# Patient Record
Sex: Male | Born: 1935 | Race: White | Hispanic: No | Smoking: Former smoker
Health system: Southern US, Community
[De-identification: ages and names within clinical notes are randomized; demographics above are authoritative.]

## PROBLEM LIST (undated history)

## (undated) DIAGNOSIS — I10 Essential (primary) hypertension: Secondary | ICD-10-CM

## (undated) DIAGNOSIS — E119 Type 2 diabetes mellitus without complications: Secondary | ICD-10-CM

## (undated) DIAGNOSIS — I679 Cerebrovascular disease, unspecified: Secondary | ICD-10-CM

## (undated) DIAGNOSIS — R569 Unspecified convulsions: Secondary | ICD-10-CM

## (undated) DIAGNOSIS — E039 Hypothyroidism, unspecified: Secondary | ICD-10-CM

## (undated) DIAGNOSIS — I472 Ventricular tachycardia, unspecified: Secondary | ICD-10-CM

## (undated) DIAGNOSIS — K2970 Gastritis, unspecified, without bleeding: Secondary | ICD-10-CM

## (undated) DIAGNOSIS — I255 Ischemic cardiomyopathy: Secondary | ICD-10-CM

## (undated) DIAGNOSIS — I2510 Atherosclerotic heart disease of native coronary artery without angina pectoris: Secondary | ICD-10-CM

## (undated) DIAGNOSIS — E785 Hyperlipidemia, unspecified: Secondary | ICD-10-CM

## (undated) HISTORY — PX: CHOLECYSTECTOMY: SHX55

## (undated) HISTORY — PX: CAROTID ENDARTERECTOMY: SUR193

---

## 1998-05-07 DIAGNOSIS — I2510 Atherosclerotic heart disease of native coronary artery without angina pectoris: Secondary | ICD-10-CM

## 1998-05-07 HISTORY — DX: Atherosclerotic heart disease of native coronary artery without angina pectoris: I25.10

## 1999-04-07 HISTORY — PX: CORONARY ARTERY BYPASS GRAFT: SHX141

## 1999-04-19 ENCOUNTER — Inpatient Hospital Stay (HOSPITAL_COMMUNITY): Admission: AD | Admit: 1999-04-19 | Discharge: 1999-05-04 | Payer: Self-pay | Admitting: Cardiology

## 1999-04-21 ENCOUNTER — Encounter: Payer: Self-pay | Admitting: Thoracic Surgery (Cardiothoracic Vascular Surgery)

## 1999-04-25 ENCOUNTER — Encounter: Payer: Self-pay | Admitting: Thoracic Surgery (Cardiothoracic Vascular Surgery)

## 1999-04-26 ENCOUNTER — Encounter: Payer: Self-pay | Admitting: Thoracic Surgery (Cardiothoracic Vascular Surgery)

## 1999-04-27 ENCOUNTER — Encounter: Payer: Self-pay | Admitting: Thoracic Surgery (Cardiothoracic Vascular Surgery)

## 2002-05-07 DIAGNOSIS — K2970 Gastritis, unspecified, without bleeding: Secondary | ICD-10-CM

## 2002-05-07 DIAGNOSIS — I679 Cerebrovascular disease, unspecified: Secondary | ICD-10-CM

## 2002-05-07 HISTORY — DX: Gastritis, unspecified, without bleeding: K29.70

## 2002-05-07 HISTORY — DX: Cerebrovascular disease, unspecified: I67.9

## 2002-06-09 ENCOUNTER — Ambulatory Visit (HOSPITAL_COMMUNITY): Admission: RE | Admit: 2002-06-09 | Discharge: 2002-06-09 | Payer: Self-pay | Admitting: Pulmonary Disease

## 2002-09-04 ENCOUNTER — Encounter: Payer: Self-pay | Admitting: Emergency Medicine

## 2002-09-04 ENCOUNTER — Emergency Department (HOSPITAL_COMMUNITY): Admission: EM | Admit: 2002-09-04 | Discharge: 2002-09-04 | Payer: Self-pay | Admitting: Emergency Medicine

## 2002-09-05 ENCOUNTER — Emergency Department (HOSPITAL_COMMUNITY): Admission: EM | Admit: 2002-09-05 | Discharge: 2002-09-05 | Payer: Self-pay | Admitting: Emergency Medicine

## 2003-01-03 ENCOUNTER — Encounter: Payer: Self-pay | Admitting: Emergency Medicine

## 2003-01-03 ENCOUNTER — Inpatient Hospital Stay (HOSPITAL_COMMUNITY): Admission: EM | Admit: 2003-01-03 | Discharge: 2003-01-07 | Payer: Self-pay | Admitting: Emergency Medicine

## 2003-01-05 ENCOUNTER — Encounter: Payer: Self-pay | Admitting: Internal Medicine

## 2003-01-23 ENCOUNTER — Emergency Department (HOSPITAL_COMMUNITY): Admission: EM | Admit: 2003-01-23 | Discharge: 2003-01-23 | Payer: Self-pay | Admitting: Emergency Medicine

## 2003-01-23 ENCOUNTER — Encounter: Payer: Self-pay | Admitting: Emergency Medicine

## 2003-04-09 ENCOUNTER — Emergency Department (HOSPITAL_COMMUNITY): Admission: EM | Admit: 2003-04-09 | Discharge: 2003-04-09 | Payer: Self-pay | Admitting: Emergency Medicine

## 2003-04-10 ENCOUNTER — Emergency Department (HOSPITAL_COMMUNITY): Admission: EM | Admit: 2003-04-10 | Discharge: 2003-04-10 | Payer: Self-pay | Admitting: Emergency Medicine

## 2003-11-08 ENCOUNTER — Inpatient Hospital Stay (HOSPITAL_COMMUNITY): Admission: EM | Admit: 2003-11-08 | Discharge: 2003-11-10 | Payer: Self-pay | Admitting: Emergency Medicine

## 2005-01-15 ENCOUNTER — Emergency Department (HOSPITAL_COMMUNITY): Admission: EM | Admit: 2005-01-15 | Discharge: 2005-01-15 | Payer: Self-pay | Admitting: Emergency Medicine

## 2006-11-28 ENCOUNTER — Ambulatory Visit (HOSPITAL_COMMUNITY): Admission: RE | Admit: 2006-11-28 | Discharge: 2006-11-28 | Payer: Self-pay | Admitting: Pulmonary Disease

## 2008-10-28 ENCOUNTER — Emergency Department (HOSPITAL_COMMUNITY): Admission: EM | Admit: 2008-10-28 | Discharge: 2008-10-28 | Payer: Self-pay | Admitting: Emergency Medicine

## 2008-11-05 ENCOUNTER — Encounter (INDEPENDENT_AMBULATORY_CARE_PROVIDER_SITE_OTHER): Payer: Self-pay | Admitting: *Deleted

## 2008-12-09 DIAGNOSIS — I10 Essential (primary) hypertension: Secondary | ICD-10-CM | POA: Insufficient documentation

## 2008-12-09 DIAGNOSIS — E039 Hypothyroidism, unspecified: Secondary | ICD-10-CM | POA: Insufficient documentation

## 2008-12-10 ENCOUNTER — Ambulatory Visit: Payer: Self-pay | Admitting: Internal Medicine

## 2008-12-10 DIAGNOSIS — R1319 Other dysphagia: Secondary | ICD-10-CM

## 2008-12-13 ENCOUNTER — Encounter: Payer: Self-pay | Admitting: Internal Medicine

## 2008-12-22 ENCOUNTER — Encounter: Payer: Self-pay | Admitting: Internal Medicine

## 2008-12-22 ENCOUNTER — Ambulatory Visit: Payer: Self-pay | Admitting: Internal Medicine

## 2008-12-22 ENCOUNTER — Ambulatory Visit (HOSPITAL_COMMUNITY): Admission: RE | Admit: 2008-12-22 | Discharge: 2008-12-22 | Payer: Self-pay | Admitting: Internal Medicine

## 2008-12-28 ENCOUNTER — Encounter: Payer: Self-pay | Admitting: Internal Medicine

## 2009-04-26 ENCOUNTER — Emergency Department (HOSPITAL_COMMUNITY): Admission: EM | Admit: 2009-04-26 | Discharge: 2009-04-26 | Payer: Self-pay | Admitting: Emergency Medicine

## 2009-10-21 ENCOUNTER — Ambulatory Visit (HOSPITAL_COMMUNITY)
Admission: RE | Admit: 2009-10-21 | Discharge: 2009-10-21 | Payer: Self-pay | Source: Home / Self Care | Admitting: Pulmonary Disease

## 2010-05-30 ENCOUNTER — Observation Stay (HOSPITAL_COMMUNITY)
Admission: EM | Admit: 2010-05-30 | Discharge: 2010-05-31 | Payer: Self-pay | Source: Home / Self Care | Attending: Pulmonary Disease | Admitting: Pulmonary Disease

## 2010-05-31 LAB — BASIC METABOLIC PANEL
BUN: 33 mg/dL — ABNORMAL HIGH (ref 6–23)
CO2: 30 mEq/L (ref 19–32)
Calcium: 9.5 mg/dL (ref 8.4–10.5)
Chloride: 103 mEq/L (ref 96–112)
Chloride: 105 mEq/L (ref 96–112)
GFR calc Af Amer: 48 mL/min — ABNORMAL LOW (ref >60)
GFR calc non Af Amer: 44 mL/min — ABNORMAL LOW (ref >60)
GFR calc non Af Amer: 40 mL/min — ABNORMAL LOW (ref >60)
Potassium: 4 mEq/L (ref 3.5–5.1)
Potassium: 4.2 mEq/L (ref 3.5–5.1)
Sodium: 139 mEq/L (ref 135–145)
Sodium: 140 mEq/L (ref 135–145)

## 2010-05-31 LAB — CBC
HCT: 37.8 % — ABNORMAL LOW (ref 39.0–52.0)
Platelets: 133 10*3/uL — ABNORMAL LOW (ref 150–400)
RBC: 3.98 MIL/uL — ABNORMAL LOW (ref 4.22–5.81)
WBC: 5.7 10*3/uL (ref 4.0–10.5)

## 2010-05-31 LAB — DIFFERENTIAL
Basophils Absolute: 0 10*3/uL (ref 0.0–0.1)
Basophils Relative: 1 % (ref 0–1)
Eosinophils Absolute: 0.1 10*3/uL (ref 0.0–0.7)
Lymphocytes Relative: 13 % (ref 12–46)
Lymphs Abs: 0.7 10*3/uL (ref 0.7–4.0)
Monocytes Absolute: 0.6 10*3/uL (ref 0.1–1.0)
Monocytes Relative: 10 % (ref 3–12)
Neutro Abs: 4.3 10*3/uL (ref 1.7–7.7)

## 2010-05-31 LAB — GLUCOSE, CAPILLARY
Glucose-Capillary: 185 mg/dL — ABNORMAL HIGH (ref 70–99)
Glucose-Capillary: 223 mg/dL — ABNORMAL HIGH (ref 70–99)
Glucose-Capillary: 257 mg/dL — ABNORMAL HIGH (ref 70–99)

## 2010-05-31 LAB — CARDIAC PANEL(CRET KIN+CKTOT+MB+TROPI)
CK, MB: 1.2 ng/mL (ref 0.3–4.0)
Relative Index: INVALID (ref 0.0–2.5)
Total CK: 52 U/L (ref 7–232)
Total CK: 40 U/L (ref 7–232)
Troponin I: 0.05 ng/mL (ref 0.00–0.06)

## 2010-05-31 LAB — POCT CARDIAC MARKERS
CKMB, poc: 1.5 ng/mL (ref 1.0–8.0)
CKMB, poc: 1.3 ng/mL (ref 1.0–8.0)
Myoglobin, poc: 237 ng/mL (ref 12–200)
Troponin i, poc: <0.05 ng/mL (ref 0.00–0.09)

## 2010-05-31 LAB — HEPATIC FUNCTION PANEL
Bilirubin, Direct: 0.1 mg/dL (ref 0.0–0.3)
Indirect Bilirubin: 0.4 mg/dL (ref 0.3–0.9)
Total Protein: 6.3 g/dL (ref 6.0–8.3)

## 2010-05-31 LAB — PHENYTOIN LEVEL, TOTAL: Phenytoin Lvl: 8.1 ug/mL — ABNORMAL LOW (ref 10.0–20.0)

## 2010-06-03 NOTE — H&P (Signed)
NAME:  Donald Owen, Donald Owen                 ACCOUNT NO.:  [id number]  MEDICAL RECORD NO.:  [id number]          PATIENT TYPE:  INP  LOCATION:  A209                          FACILITY:  [facility]  PHYSICIAN:  Donald Owen, M.D.DATE OF BIRTH:  01-22-1934  DATE OF ADMISSION:  05/30/2010 DATE OF DISCHARGE:  LH                             HISTORY & PHYSICAL   [name] is admitted with chest discomfort.  He is a 77 year old who has a known history of coronary artery occlusive disease and who came to the emergency room with chest discomfort.  It is in the middle of his chest.  It is described as an ache but he did not have shortness of breath, diaphoresis, or nausea associated with it.  His pain is currently gone.  It had been as high as a 7/10.  It had a somewhat stuttering course.  PAST MEDICAL HISTORY:  Positive for coronary artery occlusive disease status post bypass grafting, hypertension, diabetes, gout.  He has had a previous MI.  He has a seizure disorder.  He has had a stroke.  He has peripheral vascular disease.  He has had a previous carotid endarterectomy and surgically otherwise he has had a bypass grafting and cholecystectomy.  He has history of hypothyroidism and hyperlipidemia.  SOCIAL HISTORY:  He has about a 40 pack-year smoking history but stopped some years ago.  He does not use any illicit drugs.  He does not use any alcohol.  He lives at home with his daughter.  FAMILY HISTORY:  His mother died in her 80s of unknown cause.  Father had heart disease and died in his 24s.  PHYSICAL EXAMINATION:  GENERAL:  A well-developed, well-nourished mildly obese male who is in no acute distress now.  He is alert and oriented. He has some mild difficulty with speech which is chronic since his stroke some years ago. VITAL SIGNS:  His temperature is 97.5, pulse 77, respirations 20, blood pressure 174/83, O2 sats 96% on 2 liters, height 66 inches, weight 112.9 kg. HEENT:  His pupils are  reactive.  Nose and throat are clear. NECK:  Supple.  He has a carotid endarterectomy scar.  No bruits. CHEST:  No wheezes, rales, or rhonchi. HEART:  Regular without murmur, gallop, or rub. ABDOMEN:  Soft, obese without masses. EXTREMITIES:  1+ edema. CENTRAL NERVOUS SYSTEM:  Grossly intact.  LABORATORY WORK:  White count is 5700, hemoglobin is 12.8, platelets 133,000.  BMET shows his BUN is 34, creatinine 1.56.  Dilantin level 8.1.  Hepatic function normal.  Cardiac markers though far, no evidence of infarction.  His BNP is 256.  EKG shows sinus rhythm what appears to be a right bundle-branch block.  His BNP was mildly elevated at 256.  ASSESSMENT:  He has chest discomfort which has resolved.  He has known coronary artery occlusive disease.  He has elevated BNP.  Chest x-ray is suggestive at least that he has some volume overload, so we are going to plan to give him Lasix, cycle his cardiac enzymes, cycle his EKGs, and I will have him get a [facility] cardiology consultation.  He has not seen his cardiologist in some years.     Donald Owen, M.D.     ELH/MEDQ  D:  05/30/2010  T:  05/30/2010  Job:  [id number]  Electronically Signed by Kari Baars M.D. on 06/03/2010 10:43:46 AM

## 2010-06-03 NOTE — Progress Notes (Signed)
  NAME:  Donald Owen, Donald Owen                 ACCOUNT NO.:  [id number]  MEDICAL RECORD NO.:  [id number]          PATIENT TYPE:  OBV  LOCATION:  A209                          FACILITY:  [facility]  PHYSICIAN:  Dhanvi Boesen L. Juanetta Gosling, M.D.DATE OF BIRTH:  08/09/1933  DATE OF PROCEDURE: DATE OF DISCHARGE:  05/31/2010                                PROGRESS NOTE   Donald Owen is overall I think about the same.  He says he is not having any more chest pain and he is no more short of breath than he has been.  PHYSICAL EXAMINATION:  VITAL SIGNS:  Shows that his temperature is 97.9, pulse 71, respirations 20, blood pressure 157/72, O2 sats 92% on room air, weight 111.13 kg. CHEST:  Fairly clear. HEART:  Regular. ABDOMEN:  Soft.  His echocardiogram showed that his ejection fraction about 45% but he has a pretty substantial area of hypokinesis.  Dr. Marvel Plan help was noted and appreciated and recommendation is that he probably needs to have cardiac catheterization.  Donald Owen says he does not want to go through with that.  I discussed it with him at some length and asked him to simply think about it at this point.  ASSESSMENT:  My assessment then is that he has coronary artery occlusive disease.  He was admitted with chest discomfort that is poorly described.  He has some congestive heart failure.  His ejection fraction is 45% but he has hypokinesis to akinesis of the inferior and posterior myocardium.  He has a history of bypass grafting in the past.  He has diabetes, seizure disorder, previous stroke, peripheral arterial disease.  PLAN:  He does not want the procedures done, so I am not sure where we are going to go from here.  I will discuss with Cardiology consultants.     Isahia Hollerbach L. Juanetta Gosling, M.D.     ELH/MEDQ  D:  05/31/2010  T:  06/01/2010  Job:  [id number]  Electronically Signed by Kari Baars M.D. on 06/03/2010 10:43:56 AM

## 2010-06-03 NOTE — Discharge Summary (Signed)
NAME:  Donald Owen, KEELIN                 ACCOUNT NO.:  [id number]  MEDICAL RECORD NO.:  [id number]          PATIENT TYPE:  OBV  LOCATION:  A209                          FACILITY:  [facility]  PHYSICIAN:  Edward L. Juanetta Gosling, M.D.DATE OF BIRTH:  1933-07-01  DATE OF ADMISSION:  05/30/2010 DATE OF DISCHARGE:  01/25/2012LH                              DISCHARGE SUMMARY   FINAL DISCHARGE DIAGNOSES: 1. Congestive heart failure. 2. Chest discomfort. 3. History of coronary artery occlusive disease. 4. Hypertension. 5. Diabetes. 6. Gout. 7. History of myocardial infarction. 8. History of stroke. 9. Seizure disorder. 10.Peripheral vascular disease. 11.Status post carotid endarterectomy. 12.Status post coronary artery bypass grafting. 13.Status post cholecystectomy. 14.Hypothyroidism. 15.Hyperlipidemia. 16.Mild chronic renal failure. 17.Anxiety.  HISTORY:  [name] is a 77 year old who came to the emergency room initially with chest pain, but more with shortness of breath.  His chest pain was in the middle of his chest, it was described as an ache, but this had actually resolved and then when he was seen in the emergency room, he had more shortness of breath and evaluation in the emergency room showed evidence of congestive heart failure.  His past medical history is positive as mentioned for multiple medical problems as listed above.  Physical exam showed a well-developed, well-nourished mildly obese male, in no acute distress.  He has had some difficulty with speech which is chronic some years ago.  His chest showed no wheezes, rales or rhonchi. His heart was regular without murmur, gallop or rub.  His abdomen was soft.  Extremities showed 1+ edema.  Laboratory work showed his hemoglobin is 12.8 platelets 133, BUN was 34, creatinine 1.56.  Hepatic function was normal.  Cardiac markers were without evidence of myocardial infarction.  BNP was 256.  EKG showed sinus rhythm, right  bundle-branch block.  He had echocardiogram that showed an ejection fraction.  It was about the same at 45%, but did show some fairly significant hypokinesis/akinesis in the inferior and lateral portions.  He had Cardiology consultation.  He was started on Lasix, and he overall seemed to have improved fairly rapidly.  I obtained Cardiology consultation.  It was felt that this might be due to ischemia, but that he did not show evidence of myocardial infarction. It was suggested that he needs to have a cardiac catheterization, but he refused this.  He was improved at the time of discharge.  His weight was down.  He felt better and he was discharged home on Lantus 25 units at bedtime, Keppra 500 mg b.i.d., carvedilol 25 mg b.i.d., Flomax 0.4 mg b.i.d., Phenergan 25 mg q.4 h. p.r.n. nausea, hydralazine 50 mg 4 times a day, Aggrenox 1 tablet twice a day, Naprosyn 500 mg b.i.d., torsemide 20 mg daily, Dilantin 100 mg in the morning, 300 mg in the evening, Lipitor 80 mg he says he takes one-half twice a day, I told him that he could take one daily, Vicodin 5/500 q.6 h. p.r.n. pain, Xanax 1 mg t.i.d. p.r.n. anxiety, imipramine 50 mg at bedtime, TriCor 145 mg daily, Amaryl 4 mg b.i.d., allopurinol 300 mg daily, potassium chloride 20 mEq  3 times a day and a new medication which is Norvasc 5 mg daily.  He will have early followup in the Cardiology Clinic and he will have followup in my office.  It is felt that his CHF was probably related more to uncontrolled hypertension then to ischemia and his blood pressure will be monitored closely.     Edward L. Juanetta Gosling, M.D.     ELH/MEDQ  D:  05/31/2010  T:  06/01/2010  Job:  [id number]  Electronically Signed by Kari Baars M.D. on 06/03/2010 10:43:51 AM

## 2010-06-14 ENCOUNTER — Encounter: Payer: Self-pay | Admitting: Adult Health

## 2010-07-19 ENCOUNTER — Inpatient Hospital Stay (HOSPITAL_COMMUNITY)
Admission: EM | Admit: 2010-07-19 | Discharge: 2010-07-22 | DRG: 292 | Disposition: A | Discharge status: Home Health | Payer: Medicare Other | Attending: Pulmonary Disease | Admitting: Pulmonary Disease

## 2010-07-19 ENCOUNTER — Emergency Department (HOSPITAL_COMMUNITY): Payer: Medicare Other

## 2010-07-19 DIAGNOSIS — E039 Hypothyroidism, unspecified: Secondary | ICD-10-CM | POA: Diagnosis present

## 2010-07-19 DIAGNOSIS — I10 Essential (primary) hypertension: Secondary | ICD-10-CM | POA: Diagnosis present

## 2010-07-19 DIAGNOSIS — I739 Peripheral vascular disease, unspecified: Secondary | ICD-10-CM | POA: Diagnosis present

## 2010-07-19 DIAGNOSIS — Z951 Presence of aortocoronary bypass graft: Secondary | ICD-10-CM

## 2010-07-19 DIAGNOSIS — I509 Heart failure, unspecified: Principal | ICD-10-CM | POA: Diagnosis present

## 2010-07-19 DIAGNOSIS — J4489 Other specified chronic obstructive pulmonary disease: Secondary | ICD-10-CM | POA: Diagnosis present

## 2010-07-19 DIAGNOSIS — G40909 Epilepsy, unspecified, not intractable, without status epilepticus: Secondary | ICD-10-CM | POA: Diagnosis present

## 2010-07-19 DIAGNOSIS — I69922 Dysarthria following unspecified cerebrovascular disease: Secondary | ICD-10-CM

## 2010-07-19 DIAGNOSIS — E785 Hyperlipidemia, unspecified: Secondary | ICD-10-CM | POA: Diagnosis present

## 2010-07-19 DIAGNOSIS — I69959 Hemiplegia and hemiparesis following unspecified cerebrovascular disease affecting unspecified side: Secondary | ICD-10-CM

## 2010-07-19 DIAGNOSIS — J449 Chronic obstructive pulmonary disease, unspecified: Secondary | ICD-10-CM | POA: Diagnosis present

## 2010-07-19 DIAGNOSIS — E119 Type 2 diabetes mellitus without complications: Secondary | ICD-10-CM | POA: Diagnosis present

## 2010-07-19 DIAGNOSIS — I2510 Atherosclerotic heart disease of native coronary artery without angina pectoris: Secondary | ICD-10-CM | POA: Diagnosis present

## 2010-07-19 LAB — URINALYSIS, ROUTINE W REFLEX MICROSCOPIC
Ketones, ur: NEGATIVE mg/dL
Nitrite: NEGATIVE
Protein, ur: NEGATIVE mg/dL
pH: 5 (ref 5.0–8.0)

## 2010-07-19 LAB — BASIC METABOLIC PANEL
BUN: 41 mg/dL — ABNORMAL HIGH (ref 6–23)
Creatinine, Ser: 1.96 mg/dL — ABNORMAL HIGH (ref 0.4–1.5)
GFR calc non Af Amer: 34 mL/min — ABNORMAL LOW (ref >60)
Potassium: 4.5 mEq/L (ref 3.5–5.1)

## 2010-07-19 LAB — CBC
MCH: 31.1 pg (ref 26.0–34.0)
MCV: 93.1 fL (ref 78.0–100.0)
Platelets: 169 10*3/uL (ref 150–400)
RDW: 14.1 % (ref 11.5–15.5)

## 2010-07-19 LAB — POCT CARDIAC MARKERS

## 2010-07-19 LAB — DIFFERENTIAL
Eosinophils Absolute: 0.2 10*3/uL (ref 0.0–0.7)
Eosinophils Relative: 3 % (ref 0–5)
Lymphs Abs: 1.4 10*3/uL (ref 0.7–4.0)
Monocytes Absolute: 0.7 10*3/uL (ref 0.1–1.0)
Monocytes Relative: 10 % (ref 3–12)

## 2010-07-20 LAB — URINALYSIS, ROUTINE W REFLEX MICROSCOPIC
Glucose, UA: 250 mg/dL — AB
Nitrite: NEGATIVE
Protein, ur: NEGATIVE mg/dL

## 2010-07-20 LAB — CARDIAC PANEL(CRET KIN+CKTOT+MB+TROPI)
CK, MB: 1.2 ng/mL (ref 0.3–4.0)
Relative Index: INVALID (ref 0.0–2.5)

## 2010-07-20 LAB — BASIC METABOLIC PANEL
CO2: 29 mEq/L (ref 19–32)
Calcium: 9.9 mg/dL (ref 8.4–10.5)
Creatinine, Ser: 1.78 mg/dL — ABNORMAL HIGH (ref 0.4–1.5)
Glucose, Bld: 228 mg/dL — ABNORMAL HIGH (ref 70–99)

## 2010-07-20 LAB — URINE CULTURE: Colony Count: NO GROWTH

## 2010-07-21 LAB — GLUCOSE, CAPILLARY
Glucose-Capillary: 216 mg/dL — ABNORMAL HIGH (ref 70–99)
Glucose-Capillary: 324 mg/dL — ABNORMAL HIGH (ref 70–99)
Glucose-Capillary: 209 mg/dL — ABNORMAL HIGH (ref 70–99)
Glucose-Capillary: 238 mg/dL — ABNORMAL HIGH (ref 70–99)
Glucose-Capillary: 262 mg/dL — ABNORMAL HIGH (ref 70–99)

## 2010-07-21 LAB — BASIC METABOLIC PANEL
BUN: 36 mg/dL — ABNORMAL HIGH (ref 6–23)
Chloride: 101 mEq/L (ref 96–112)
GFR calc Af Amer: 44 mL/min — ABNORMAL LOW (ref >60)
GFR calc non Af Amer: 37 mL/min — ABNORMAL LOW (ref >60)
Potassium: 4.4 mEq/L (ref 3.5–5.1)
Sodium: 139 mEq/L (ref 135–145)

## 2010-07-21 LAB — URINE CULTURE
Colony Count: NO GROWTH
Culture: NO GROWTH

## 2010-07-22 LAB — BASIC METABOLIC PANEL
Calcium: 9.4 mg/dL (ref 8.4–10.5)
GFR calc Af Amer: 49 mL/min — ABNORMAL LOW (ref >60)
GFR calc non Af Amer: 40 mL/min — ABNORMAL LOW (ref >60)
Potassium: 4.3 mEq/L (ref 3.5–5.1)
Sodium: 140 mEq/L (ref 135–145)

## 2010-07-22 LAB — GLUCOSE, CAPILLARY
Glucose-Capillary: 226 mg/dL — ABNORMAL HIGH (ref 70–99)
Glucose-Capillary: 183 mg/dL — ABNORMAL HIGH (ref 70–99)

## 2010-07-24 NOTE — H&P (Signed)
NAME:  Donald Owen, Donald Owen                 ACCOUNT NO.:  [id number]  MEDICAL RECORD NO.:  [id number]           PATIENT TYPE:  I  LOCATION:  A306                          FACILITY:  [facility]  PHYSICIAN:  Melvyn Novas, MDDATE OF BIRTH:  1933-09-10  DATE OF ADMISSION:  07/19/2010 DATE OF DISCHARGE:  LH                             HISTORY & PHYSICAL   The patient is a 77 year old white male patient of Dr. Juanetta Gosling with diffuse vascular disease, history of CABG, CHF, ejection fraction 45%, and insulin-dependant diabetes.  The patient complained of some localized sharp epigastric pain this evening and for last 3 days.  His wife states she thought it was the fluid building up again.  The patient relates that he has had some increasing dyspnea as of late and has COPD, quit smoking 15 years ago.  He denies anginal chest pain.  No orthopnea or PND.  He was seen in the ER.  Initial cardiac enzymes were negative; however, his BNP was elevated at 428.  Chest x-ray reveals no evidence of frank pulmonary edema.  He also has uncontrolled hypertension with blood pressure of 167 systolic over 92.  The patient was admitted to control the above issues and to move any ischemic component through lowering of his systolic pressures and monitor his glucoses.  Past medical history is significant for hypothyroidism, coronary artery disease status post CABG x5 thirteen years ago, hypertension, hyperlipidemia, BPH, seizure disorder, gout, insulin-dependant diabetes, CHF, ejection fraction 45%, chronic renal failure, mild general anxiety disorder status post CVA with a right hemiparesis.  Past surgical history is significant for CABG, cholecystectomy, and left carotid endarterectomy.  SOCIAL HISTORY:  He is married.  He has a 40 pack-year history of smoking, quit 15 years ago.  CURRENT MEDICINES: 1. Keppra 500 p.o. b.i.d. 2. Imipramine 50 mg p.o. h.s. 3. Norvasc 5 mg p.o. daily. 4. Flomax 0.4 mg p.o.  daily. 5. Dilantin 100 mg p.o. in a.m. and 3 tablets of 300 mg p.o. h.s. 6. Aspirin 81 mg per day. 7. TriCor 145 p.o. daily. 8. KCl 20 mEq p.o. t.i.d. 9. Allopurinol 300 mg p.o. daily. 10.Lipitor 40 mg p.o. daily. 11.Aggrenox 25/200 p.o. b.i.d. 12.Carvedilol 25 mg p.o. b.i.d. 13.Lantus insulin 25 units h.s. 14.Xanax 1 mg p.o. t.i.d.  PHYSICAL EXAMINATION:  VITAL SIGNS:  Blood pressure at present 144/64, respiratory rate is 22, pulse 65 and regular.  He is afebrile. EYES:  PERRLA intact.  Sclerae clear.  Conjunctivae pink. NECK:  No JVD.  No carotid bruits.  No thyromegaly.  No thyroid bruits. There is a left carotid endarterectomy scar noted. LUNGS:  Prolonged expiratory phase.  Diminished breath sounds at bases. No rales, wheeze, or rhonchi appreciable. HEART:  Regular rhythm.  No murmurs, gallops, heaves, lifts, or rubs. No S3, S4, gallop auscultated. ABDOMEN:  Obese, soft, nontender.  Bowel sounds normoactive.  No guarding, rebound, or hepatosplenomegaly. EXTREMITIES:  No clubbing, cyanosis, or edema. NEUROLOGIC:  Cranial nerves II through XII are grossly intact.  The patient has a right-sided hemiparesis.  IMPRESSION: 1. Atypical left gastric discomfort. 2. Elevated BNP with an ejection fraction reported  45% by     echocardiogram. 3. Uncontrolled hypertension. 4. Insulin-dependant diabetes. 5. Hyperlipidemia. 6. Status post cerebrovascular accident, right hemiparesis. 7. Benign prostatic hypertrophy. 8. Gout. 9. Anxiety disorder. 10.Seizure disorder.  The plan at present is to give insulin a.c. and h.s. glucoses, sensitive Humalog sliding scale to include h.s. coverage.  Continue to resume all current medicines.  Monitor BP.  An 1800-calorie ADA diet, and I will make further recommendations as the database expands.     Melvyn Novas, MD     RMD/MEDQ  D:  07/19/2010  T:  07/20/2010  Job:  [id number]  Electronically Signed by Oval Linsey MD on  07/24/2010 03:49:32 PM

## 2010-07-27 NOTE — Progress Notes (Signed)
  NAME:  Donald Owen, Donald Owen                 ACCOUNT NO.:  [id number]  MEDICAL RECORD NO.:  [id number]           PATIENT TYPE:  I  LOCATION:  A306                          FACILITY:  [facility]  PHYSICIAN:  Kyerra Vargo L. Juanetta Gosling, M.D.DATE OF BIRTH:  1933-05-25  DATE OF PROCEDURE:  07/21/2010 DATE OF DISCHARGE:                                PROGRESS NOTE   Donald Owen is still somewhat short of breath.  He denies any chest discomfort.  He has no other new complaints.  He has not been coughing. His exam today shows temperature of 97.5, pulse 64, respirations 20, blood pressure 160/85, O2 sats 94% on room air.  He has less edema of his legs.  He still has some edema in the soft tissues of his buttocks area.  His chest is clearer.  He still has some rhonchi.  Heart is regular without gallop.  Abdomen shows is soft, protuberant without masses.  Assessment then is he has been admitted with congestive heart failure and he is better but not quite ready for discharge.  He has multiple other medical problems including coronary artery occlusive disease and it was suggested that he have a cardiac catheterization at his last admission which he adamantly refuses.  He has COPD.  He has had a stroke.  He has seizure disorder.  He has gout and he has diabetes. All of his other problems seem to be fairly well-controlled.  I am going to plan to continue his IV diuresis, probably discharge him tomorrow.     Bathsheba Durrett L. Juanetta Gosling, M.D.     ELH/MEDQ  D:  07/22/2010  T:  07/22/2010  Job:  [id number]  Electronically Signed by Kari Baars M.D. on 07/27/2010 09:02:56 AM

## 2010-07-27 NOTE — Progress Notes (Signed)
  NAME:  Donald Owen, Donald Owen                 ACCOUNT NO.:  [id number]  MEDICAL RECORD NO.:  [id number]           PATIENT TYPE:  I  LOCATION:  A306                          FACILITY:  [facility]  PHYSICIAN:  Jaylena Holloway L. Juanetta Gosling, M.D.DATE OF BIRTH:  1933-10-30  DATE OF PROCEDURE: DATE OF DISCHARGE:                                PROGRESS NOTE   [name] is admitted with what appears to be congestive heart failure. He says he is doing better than he was last night when he was admitted, but he is still short of breath.  I do not have a weight to compare yet. He has no other new complaints.  He did not really have much of chest discomfort with this.  I discussed his situation with him again today at his last hospitalization, he was evaluated by the cardiologist and he did not want to have cardiac catheterization done and said his mom has agreed to do that.  So I do not see any reason to ask for Cardiology consultation on this admission.  I am going to plan to continue with his IV Lasix, daily weight, oxygen, and follow from there.  He also has some foul-smelling urine.  I am going to get a urine and C and S and I did not start him on antibiotics yet.  I will see how he does from here.  He has multiple other medical problems including coronary artery disease status post bypass grafting, seizure disorder, he has had previous stroke, but his biggest problem right now is his CHF.     Sadiya Durand L. Juanetta Gosling, M.D.     ELH/MEDQ  D:  07/20/2010  T:  07/21/2010  Job:  [id number]  Electronically Signed by Kari Baars M.D. on 07/27/2010 09:02:27 AM

## 2010-07-27 NOTE — Discharge Summary (Signed)
NAME:  Donald Owen, Donald Owen                 ACCOUNT NO.:  [id number]  MEDICAL RECORD NO.:  [id number]           PATIENT TYPE:  I  LOCATION:  A306                          FACILITY:  [facility]  PHYSICIAN:  Patrycja Mumpower L. Juanetta Gosling, M.D.DATE OF BIRTH:  1933-10-27  DATE OF ADMISSION:  07/19/2010 DATE OF DISCHARGE:  03/17/2012LH                              DISCHARGE SUMMARY   FINAL DISCHARGE DIAGNOSES: 1. Congestive heart failure. 2. Coronary artery occlusive disease, status post bypass grafting. 3. Peripheral vascular disease. 4. History of stroke. 5. Mild right hemiparesis. 6. Mild dysarthria related to his previous stroke. 7. Seizure disorder. 8. Hypertension. 9. Hyperlipidemia. 10.Benign prostatic hypertrophy. 11.Gout. 12.Diabetes, which is insulin dependent. 13.Status post coronary artery bypass grafting. 14.Cholecystectomy. 15.Status post cholecystectomy. 16.Status post left carotid endarterectomy. 17.Hypothyroidism. 18.Chronic obstructive pulmonary disease.  HISTORY:  [name] is a 77 year old who has a long known history of multiple medical problems.  He has had increasing shortness of breath for the last several days.  He was hospitalized here in January of this year with CHF.  At that time, it was recommended that he have cardiac catheterization, but he did not want to do that and he again says that he will not have a cardiac catheterization.  When he came to the emergency room, his blood pressure was 144/64, respirations 22.  He had a left carotid endarterectomy scar.  He had no JVD.  His chest showed some decreased breath sounds.  Heart was regular without gallop.  He had an elevated BNP and felt to have CHF.  HOSPITAL COURSE:  He was treated with intravenous diuretics.  He was ruled out for myocardial infarction and improved.  He was placed on oxygen, continued on his other medications, and by the time of discharge he was much improved.  He admitted that he had not been eating  properly and had been eating a lot of salt, so I have not changed his home medications, but we will have him try to reduce his salt intake and he will have Home Health Services with a CHF protocol.  He is discharged home on Keppra 500 mg b.i.d., imipramine 50 mg at bedtime, allopurinol 300 mg daily, Xanax 0.5 mg t.i.d. p.r.n. anxiety, carvedilol 25 mg b.i.d., amlodipine 5 mg daily, hydralazine 50 mg q.i.d., TriCor 145 mg daily, Lipitor 40 mg daily, he is taking half of an 80 mg tablet b.i.d., Dilantin 100 mg in the morning and 300 mg in the evening, Lantus 25 units at bedtime, torsemide 100 mg one-half tablet daily, Vicodin 5/500 q.6 h p.r.n. pain, aspirin 325 mg daily, naproxen 500 mg b.i.d., Phenergan 25 mg q.4 h p.r.n. nausea, potassium chloride 20 mEq t.i.d., Flomax 0.4 mg b.i.d., Amaryl 4 mg b.i.d., Aggrenox one b.i.d.  As mentioned, he will have Home Health Services with a CHF protocol, more diet instruction and I will try to keep him from ending up back in the hospital.  He will follow up in the office in about 2 weeks.  Saheed Carrington L. Juanetta Gosling, M.D.     ELH/MEDQ  D:  07/22/2010  T:  07/22/2010  Job:  [id number]  Electronically Signed by Kari Baars M.D. on 07/27/2010 09:02:25 AM

## 2010-07-27 NOTE — Progress Notes (Signed)
  NAME:  Donald Owen, Donald Owen                 ACCOUNT NO.:  [id number]  MEDICAL RECORD NO.:  [id number]           PATIENT TYPE:  I  LOCATION:  A306                          FACILITY:  [facility]  PHYSICIAN:  Kayshawn Ozburn L. Juanetta Gosling, M.D.DATE OF BIRTH:  06/29/1933  DATE OF PROCEDURE:  07/22/2010 DATE OF DISCHARGE:  07/22/2010                                PROGRESS NOTE   HISTORY OF PRESENT ILLNESS:  Donald Owen seems to be doing better.  He says he is not having any shortness of breath.  He has not had any chest pain and he feels well and wants to go home.  His oxygen saturation is 95% on room air.  PHYSICAL EXAMINATION:  VITAL SIGNS:  His temperature is 97.9, pulse 62, respirations 20, blood pressure 162/78, and O2 sats 95% on room air. CHEST:  Much clearer. HEART:  Regular. ABDOMEN:  Soft.  He looks more comfortable. EXTREMITIES:  He has only minimal if any edema now.  LABORATORY DATA:  BUN is 30, creatinine 1.67, and potassium is 4.3.  ASSESSMENT:  He is better.  PLAN:  To continue with his current treatments and medications.  No changes today.  I am going to discharge him home.  Please see discharge summary for details.     Marilynne Dupuis L. Juanetta Gosling, M.D.     ELH/MEDQ  D:  07/22/2010  T:  07/22/2010  Job:  [id number]  Electronically Signed by Kari Baars M.D. on 07/27/2010 09:02:59 AM

## 2010-08-07 LAB — BASIC METABOLIC PANEL
BUN: 45 mg/dL — ABNORMAL HIGH (ref 6–23)
CO2: 29 mEq/L (ref 19–32)
Calcium: 10 mg/dL (ref 8.4–10.5)
GFR calc non Af Amer: 26 mL/min — ABNORMAL LOW (ref >60)
Glucose, Bld: 194 mg/dL — ABNORMAL HIGH (ref 70–99)

## 2010-08-07 LAB — PHENYTOIN LEVEL, TOTAL: Phenytoin Lvl: 15.4 ug/mL (ref 10.0–20.0)

## 2010-08-07 LAB — DIFFERENTIAL
Basophils Absolute: 0 10*3/uL (ref 0.0–0.1)
Basophils Relative: 0 % (ref 0–1)
Eosinophils Relative: 1 % (ref 0–5)
Lymphocytes Relative: 6 % — ABNORMAL LOW (ref 12–46)
Neutro Abs: 12.3 10*3/uL — ABNORMAL HIGH (ref 1.7–7.7)

## 2010-08-07 LAB — CBC
Platelets: 187 10*3/uL (ref 150–400)
RDW: 15.5 % (ref 11.5–15.5)

## 2010-08-10 ENCOUNTER — Emergency Department (HOSPITAL_COMMUNITY)
Admission: EM | Admit: 2010-08-10 | Discharge: 2010-08-11 | Disposition: A | Discharge status: Home / Self Care | Payer: Medicare Other | Attending: Emergency Medicine | Admitting: Emergency Medicine

## 2010-08-10 DIAGNOSIS — I252 Old myocardial infarction: Secondary | ICD-10-CM | POA: Insufficient documentation

## 2010-08-10 DIAGNOSIS — R0789 Other chest pain: Secondary | ICD-10-CM | POA: Insufficient documentation

## 2010-08-10 DIAGNOSIS — R569 Unspecified convulsions: Secondary | ICD-10-CM | POA: Insufficient documentation

## 2010-08-10 DIAGNOSIS — I509 Heart failure, unspecified: Secondary | ICD-10-CM | POA: Insufficient documentation

## 2010-08-10 DIAGNOSIS — I452 Bifascicular block: Secondary | ICD-10-CM | POA: Insufficient documentation

## 2010-08-10 DIAGNOSIS — I10 Essential (primary) hypertension: Secondary | ICD-10-CM | POA: Insufficient documentation

## 2010-08-10 DIAGNOSIS — Z951 Presence of aortocoronary bypass graft: Secondary | ICD-10-CM | POA: Insufficient documentation

## 2010-08-10 DIAGNOSIS — I2510 Atherosclerotic heart disease of native coronary artery without angina pectoris: Secondary | ICD-10-CM | POA: Insufficient documentation

## 2010-08-10 DIAGNOSIS — R296 Repeated falls: Secondary | ICD-10-CM | POA: Insufficient documentation

## 2010-08-10 DIAGNOSIS — N289 Disorder of kidney and ureter, unspecified: Secondary | ICD-10-CM | POA: Insufficient documentation

## 2010-08-10 DIAGNOSIS — E1169 Type 2 diabetes mellitus with other specified complication: Secondary | ICD-10-CM | POA: Insufficient documentation

## 2010-08-11 ENCOUNTER — Emergency Department (HOSPITAL_COMMUNITY): Payer: Medicare Other

## 2010-08-11 LAB — BASIC METABOLIC PANEL
Calcium: 9.7 mg/dL (ref 8.4–10.5)
GFR calc Af Amer: 36 mL/min — ABNORMAL LOW (ref >60)
GFR calc non Af Amer: 30 mL/min — ABNORMAL LOW (ref >60)
Glucose, Bld: 207 mg/dL — ABNORMAL HIGH (ref 70–99)
Potassium: 3.9 mEq/L (ref 3.5–5.1)
Sodium: 138 mEq/L (ref 135–145)

## 2010-08-11 LAB — DIFFERENTIAL
Basophils Absolute: 0 10*3/uL (ref 0.0–0.1)
Basophils Relative: 0 % (ref 0–1)
Eosinophils Relative: 3 % (ref 0–5)
Lymphocytes Relative: 11 % — ABNORMAL LOW (ref 12–46)
Neutro Abs: 7.4 10*3/uL (ref 1.7–7.7)

## 2010-08-11 LAB — CBC
HCT: 39.2 % (ref 39.0–52.0)
RDW: 14.5 % (ref 11.5–15.5)
WBC: 9.6 10*3/uL (ref 4.0–10.5)

## 2010-08-11 LAB — POCT CARDIAC MARKERS
CKMB, poc: 1.3 ng/mL (ref 1.0–8.0)
Myoglobin, poc: 143 ng/mL (ref 12–200)
Troponin i, poc: <0.05 ng/mL (ref 0.00–0.09)

## 2010-09-09 NOTE — Consult Note (Signed)
NAME:  Donald Owen, Donald Owen                 ACCOUNT NO.:  [id number]  MEDICAL RECORD NO.:  [id number]          PATIENT TYPE:  INP  LOCATION:  A209                          FACILITY:  [facility]  PHYSICIAN:  Gerrit Friends. Dietrich Pates, MD, FACCDATE OF BIRTH:  1933-09-12  DATE OF CONSULTATION:  05/30/2010 DATE OF DISCHARGE:                                CONSULTATION   Cardiologist previously Dr. Daleen Squibb.  The patient has not been seen by Cardiology in many years.  PRIMARY CARE DOCTOR:  Edward L. Juanetta Gosling, MD  CHIEF COMPLAINT:  Chest tightness and shortness of breath.  HISTORY OF PRESENT ILLNESS:  This is a 77 year old married white male patient with history of CABG in December 2000 with a LIMA to the LAD, SVG to the diagonal 1, SVG to the OM, SVG to the PDA, and SVG to the PLA.  He had prior DMI and ejection fraction of 25-30%, CABG.  The ejection fraction improved to 50% with inferior hypokinesis on echo in 2005.  Adenosine Myoview was negative for ischemia at that same time. The patient presents now with 3-day history of recurrent chest pressure that wax and wane for over these days.  He also had associated dyspnea and leg swelling.  He denies any exertional symptoms.  He did not have any nitroglycerin to use.  His pain worsened at 2 a.m. while he was still awake and he came to the emergency room, where his pain eased with the nitroglycerin paste and IV Lasix.  He also says heart has been racing at times, but denies wandering, dizziness or presyncopal signs or symptoms.  ALLERGIES:  No known drug allergies.  CURRENT MEDICATIONS: 1. Lovenox. 2. Lasix 40 mg IV q.12 h. 3. Carvedilol 25 mg b.i.d. 4. Aggrenox 25/200 b.i.d. 5. Hydralazine 25 mg q.i.d. 6. Tamsulosin 0.4 mg b.i.d.  PAST MEDICAL HISTORY:  Significant for CABG x5 in 2000, history of hypertension, diabetes mellitus, history of CVA x3 without residual, history of hypothyroidism, status post left carotid endarterectomy in 2004, history of  mild dysphagia with esophageal dilatation and erosive gastritis in 2004.  SOCIAL HISTORY:  The patient is married and lives with his wife.  He smoked for 55-60 pack years.  He quit in 2000.  He is retired from [employer].  He does not use alcohol or illicit drugs.  FAMILY HISTORY:  Mother died at 75.  Father died in the 37s with heart disease.  He has 2 brothers and one sister.  REVIEW OF SYSTEMS:  Please see HPI.  Otherwise, review of systems is negative.  PHYSICAL EXAMINATION:  GENERAL:  Very pleasant 77 year old white male, in no acute distress. VITAL SIGNS:  Blood pressure 150/70, pulse 77, temperature 97, O2 sats 96% on 2 L. HEENT:  Head is normocephalic without signs of trauma.  Extraocular eye movements are intact.  Pupils are equal and reactive to light and accommodation.  His throat is moist throughout without erythema or exudate.  Neck is without JVD, carotid bruits or thyroid enlargement. LUNGS:  Decreased breath sounds with bibasilar rales. HEART:  Regular rate and rhythm at 70 beats per minute.  Normal S1  and S2.  Positive S4 with 1/6 systolic murmur at the apex.  ABDOMEN:  Soft without organomegaly, masses, lesions or abnormal tenderness.  It is obese. EXTREMITIES:  He is missing his right thumb.  He has 2+ edema in his right lower extremity up to his knee, 1+ in his left lower extremity up to his knee. NEUROLOGICAL:  He is slightly weaker on his right side.  He is alert and oriented x3.  CHEST X-RAY:  Prior CABG, cardiomegaly and mild CHF.  Echo is pending. EKG normal sinus rhythm with first-degree AV block, right bundle- branch block, left posterior fascicular block.  LABORATORY DATA:  Sodium 139, potassium 4.0, chloride 103, CO2 27, BUN 34, creatinine 1.56, glucose 284, BNP was 256.  CKs, MBs and troponins negative x3.  IMPRESSION: 1. Chest pain consistent with ischemia, myocardial infarction ruled     out. 2. Congestive heart failure, question secondary  to left ventricular     dysfunction versus ischemia. 3. Status post coronary artery bypass graft x5 in 2000. 4. History of Doppler myocardial imaging with ejection fraction of 25%     in 2000, improved to 50% in 2005. 5. Hypertension. 6. Diabetes mellitus. 7. History of cerebrovascular accident x3 without residual, currently     on Aggrenox. 8. Status post left carotid endarterectomy in 2004. 9. Status post esophageal dilatation and erosive gastritis. 10.Renal insufficiency, question if this is new.  PLAN:  We will continue to diuresis.  We will watch his renal function closely.  We will schedule adenosine Myoview once he is stable from a heart failure standpoint.  He will need nitroglycerin to go home with.     Jacolyn Reedy, PA-C   ______________________________ Gerrit Friends. Dietrich Pates, MD, [facility]    ML/MEDQ  D:  05/30/2010  T:  05/31/2010  Job:  [id number]  Electronically Signed by Jacolyn Reedy PA-C on 08/02/2010 12:50:02 PM Electronically Signed by Delphos Bing MD [facility] on 09/09/2010 10:12:51 PM

## 2010-09-19 NOTE — Op Note (Signed)
NAME:  Donald Owen, Donald Owen                 ACCOUNT NO.:  [id number]   MEDICAL RECORD NO.:  [id number]          PATIENT TYPE:  AMB   LOCATION:  DAY                           FACILITY:  [facility]   PHYSICIAN:  R. Roetta Sessions, M.D. DATE OF BIRTH:  04-21-1934   DATE OF PROCEDURE:  12/22/2008  DATE OF DISCHARGE:                               OPERATIVE REPORT   PROCEDURE PERFORMED:  EGD with Elease Hashimoto dilation followed by biopsy.   INDICATIONS FOR PROCEDURE:  A 77 year old gentleman with a history of  recurrent esophageal dysphagia to solids.  Back in 2004 he underwent EGD  and empiric passage of a Maloney dilator with significant improvement in  symptoms.  He has recurrent esophageal dysphagia.  He has a history of  H. pylori infection reportedly treated by Dr. Juanetta Gosling.  EGD is now being  done.  Potential for esophageal dilation reviewed with Donald Owen.  Risks, benefits and alternatives discussed.  Please see the  documentation in the medical record.   PROCEDURE NOTE:  O2 saturation, blood pressure, pulse and respirations  monitored throughout the entire procedure.  Conscious sedation:  Versed  2 mg IV, Demerol 50 mg IV in single dose.  Instrument:  Pentax video  chip system.  Cetacaine spray for topical pharyngeal anesthesia.   FINDINGS:  Examination tubular esophagus revealed no mucosal  abnormalities.  The esophagus is patent down through the EG junction.   Stomach:  Gastric cavity was empty and insufflated well with air.  Thorough examination of the gastric mucosa including retroflex of  proximal stomach esophagogastric junction demonstrated only a small  hiatal hernia and multiple antral erosions.  There was no ulcer or  infiltrating process.  Pylorus patent, easily traversed.  Examination of  the bulb and second portion revealed only bulbar erosions.   Therapeutic/diagnostic maneuvers performed:  1. The scope was withdrawn.  A 56-French Maloney dilator was passed      full insertion with  ease.  A look back revealed no apparent      complication related to the passage of the dilator.   Subsequently antral biopsies for histology were taken x2 without  difficulty or apparent complication.  The patient tolerated the  procedure well as reactive endoscopy.   IMPRESSION:  1. Normal-appearing tubular esophagus.  2. Normal esophageal mucosa.  3. Status passage of a 56-French Maloney dilator.  4. Small hiatal hernia.  5. Bulbar and antral erosions of uncertain significance, otherwise      unremarkable stomach, D1 D2 status post antral biopsies.   RECOMMENDATIONS:  Further recommendations to follow.   As a separate issue, there is a question as to whether or not Donald Owen  has ever had a colonoscopy.  Would defer to Dr. Juanetta Gosling whether or not  he feels screening a colonoscopy would be warranted at some point or  not.      Jonathon Bellows, M.D.  Electronically Signed     RMR/MEDQ  D:  12/22/2008  T:  12/22/2008  Job:  [id number]   cc:   [name], M.D.  Fax: [phone]

## 2010-09-22 NOTE — Group Therapy Note (Signed)
   NAME:  Donald Owen, Donald Owen                           ACCOUNT NO.:  [id number]   MEDICAL RECORD NO.:  [id number]                   PATIENT TYPE:  INP   LOCATION:  A201                                 FACILITY:  [facility]   PHYSICIAN:  Edward L. Juanetta Gosling, M.D.             DATE OF BIRTH:  [birth year]   DATE OF PROCEDURE:  01/04/2003  DATE OF DISCHARGE:                                   PROGRESS NOTE   PROBLEMS:  1. Congestive heart failure.  2. Renal failure.  3. Hypokalemia.  4. Chronic obstructive pulmonary disease.  5. Coronary disease.  6. Hypertension.  7. Dysphasia.   SUBJECTIVE:  Donald Owen continues to have problems with swallowing and says  he does not feel very well.  He has had a stroke and has some difficulty  with speech, and he is a bit harder to understand than usual.  He has no  other new complaints.  He did not tolerate runs of potassium yesterday.   OBJECTIVE:  His exam today shows his temperature is 96.9, pulse 65,  respirations are 18, blood sugar 218, blood pressure 101/55.  His I&O is -  1650.  White count 9500, hemoglobin 14.7, sodium 137, potassium 2.9 still.  BUN is 110, creatinine 2.7 which is somewhat better.  TSH 2.027.   ASSESSMENT:  He is still hypokalemic.  He blood sugar is under fairly good  control, his blood pressure is better.  He still has dysphasia, he has had  the stroke which is about the same.  He has multiple medical problems.  He  does seem to be improving slightly.   PLAN:  For GI consultation today.                                               Edward L. Juanetta Gosling, M.D.    Gwenlyn Found  D:  01/04/2003  T:  01/04/2003  Job:  [id number]

## 2010-09-22 NOTE — Group Therapy Note (Signed)
   NAME:  Donald Owen, Donald Owen                           ACCOUNT NO.:  [id number]   MEDICAL RECORD NO.:  [id number]                   PATIENT TYPE:  INP   LOCATION:  A201                                 FACILITY:  [facility]   PHYSICIAN:  Edward L. Juanetta Gosling, M.D.             DATE OF BIRTH:  09-01-1933   DATE OF PROCEDURE:  DATE OF DISCHARGE:                                   PROGRESS NOTE   PROBLEMS:  1. Acute on chronic renal failure, probably from dehydration.  2. Chronic obstructive pulmonary disease.  3. Coronary disease.  4. Diabetes.  5. Difficulty swallowing.  6. Status post stroke.   HISTORY:  [name] says that he is feeling a little bit better today.  He  is still having some trouble swallowing and his speech evaluation yesterday  showed that he has difficulty with large volumes and clearly aspirates those  silently and has no symptoms based on that aspiration.  His pill  esophagogram was cancelled.  At this point, we are trying to work out what  should and could be done for further evaluation with the GI team.  He says  he has no new complaints today.  His daughter says that he has had some back  pain, but he says he is fine.  He is not having any other new problems.  He  is having no change in his swallowing difficulties.   EXAM:  CHEST:  Relatively clear.  HEART:  Regular.  EXTREMITIES:  He did not have any edema of the extremities.  HEENT:  Mucous membranes are slightly dry.  His nose and throat are clear.   ASSESSMENT:  He has multiple medical problems.   PLAN:  Have him further evaluated by the GI team and try to decide what we  can do.  He may require a procedure like an EGD.                                               Edward L. Juanetta Gosling, M.D.    ELH/MEDQ  D:  01/06/2003  T:  01/06/2003  Job:  [id number]

## 2010-09-22 NOTE — Group Therapy Note (Signed)
   NAME:  Donald Owen, Donald Owen                           ACCOUNT NO.:  [id number]   MEDICAL RECORD NO.:  [id number]                   PATIENT TYPE:  INP   LOCATION:  A201                                 FACILITY:  [facility]   PHYSICIAN:  Edward L. Juanetta Gosling, M.D.             DATE OF BIRTH:  June 05, 1933   DATE OF PROCEDURE:  DATE OF DISCHARGE:                                   PROGRESS NOTE   PROBLEMS:  1. Previous cerebrovascular accident.  2. Difficulty with swallowing.  3. Coronary artery occlusive disease.  4. Hypertension.  5. Diabetes.  6. Hypothyroidism.  7. Acute and chronic renal failure.   SUBJECTIVE:  Donald Owen says he is feeling pretty well and actually slept  okay this morning.  His blood sugar is running in the 120s this morning.  His speech is still difficult to understand, and we are still working that  up, hopefully, getting a barium pill esophagogram and a modified barium  swallow done today.   OBJECTIVE:  Temperature 98.1, blood pressure 118/63, pulse 67, respirations  16, blood sugars as mentioned.   ASSESSMENT:  He seems to be improving but slowly.   PLAN:  Continue with treatments, try to get the studies done.  His labs this  morning shows that his BUN is 72 now, creatinine 1.9.  Potassium still has  not come up a great deal, but he did not tolerate runs of potassium  chloride, so I am not going to do that today.  His potassium is up to 3.                                               Edward L. Juanetta Gosling, M.D.    ELH/MEDQ  D:  01/05/2003  T:  01/05/2003  Job:  [id number]

## 2010-09-22 NOTE — Op Note (Signed)
NAME:  Donald Owen, Donald Owen                           ACCOUNT NO.:  [id number]   MEDICAL RECORD NO.:  [id number]                   PATIENT TYPE:  INP   LOCATION:  A201                                 FACILITY:  [facility]   PHYSICIAN:  Lionel December, M.D.                 DATE OF BIRTH:  [birth year]   DATE OF PROCEDURE:  01/07/2003  DATE OF DISCHARGE:                                 OPERATIVE REPORT   PROCEDURE:  Esophagogastroduodenoscopy with esophageal dilatation.   INDICATIONS FOR PROCEDURE:  Mr. Hoh is a 77 year old Caucasian male who  suffered a CVA a few weeks ago who has dysphagia.  Some of his dysphagia is  felt to be oropharyngeal but there is a concern that he also could have  esophageal dysphagia and, therefore, undergoing this study. The procedure is  reviewed with the patient and informed consent is obtained.   PREOPERATIVE MEDICATIONS:  Cetacaine spray for pharyngeal topical  anesthesia.  Demerol 25 mg IV, Versed 3 mg IV in divided dose.   FINDINGS:  Procedure performed in endoscopy suite.  The patient's vital  signs and O2 saturation were monitored during the procedure and remained  stable.   PROCEDURE:  The patient was placed in left lateral position and the Olympus  videoscope was passed through oropharynx without any difficulty into the  esophagus.  The range of the LES was seen well on the __________ .  There  was no pooling of secretions and the left side appeared to move slower than  the right side.   Esophagus:  Mucosa of the esophagus was normal except he had a coating of  what was felt to be food debris in the distal esophagus.  However, there was  no ring of stricture or changes of esophagitis.   Stomach:  It was empty and distended very well with insufflation.  The folds  of the proximal stomach are normal.  Examination of the mucosa revealed a  large scar of antrum and prepyloric area with scarring and a single erosion.  Pyloric channel, however, was patent.   The angularis, fundus, and cardia  were examined by retroflexing the scope over normal.   Duodenum:  Exam of the bulb and second part of the duodenum was also normal.  Endoscope was withdrawn.   Esophageal dilatation was performed by passing 56 French Maloney dilator  through the esophagus.  The esophagus was re-examined post dilatation and  there was no mucosal injury.  Endoscope is straightened and withdrawn.  The  patient tolerated the procedure well.   FINAL DIAGNOSES:  1. Normal examination of the esophagus.  2. Esophagus dilatated empirically by passing 56 French Maloney dilator     given history of dysphagia.  3. Erosive gastritis with a large antral scar.    RECOMMENDATIONS:  1. Will continue PPI __________  p.o. route.  2. H. pylori serology will be checked.  Lionel December, M.D.    NR/MEDQ  D:  01/07/2003  T:  01/07/2003  Job:  [id number]   cc:   [name], M.D.  [address]  [place]  [place] [address]  Fax: [phone]

## 2010-09-22 NOTE — Consult Note (Signed)
NAME:  Donald Owen, Donald Owen                           ACCOUNT NO.:  [id number]   MEDICAL RECORD NO.:  [id number]                   PATIENT TYPE:  INP   LOCATION:  A218                                 FACILITY:  [facility]   PHYSICIAN:  Donald Owen, M.D.                DATE OF BIRTH:  06/12/1933   DATE OF CONSULTATION:  11/09/2003  DATE OF DISCHARGE:                                   CONSULTATION   PRIMARY CARE PHYSICIAN:  Donald Owen, M.D.   CARDIOLOGIST:  Previously with Dr. Juanito Owen.   HISTORY OF PRESENT ILLNESS:  Donald Owen is a 77 year old man who has a  history of coronary disease status post bypass about five years ago who was  lost to followup and has not seen a cardiologist in about three years.  He  has significant history of diabetes, hypertension, and hyperlipidemia.  He  basically came into the emergency department yesterday after an episode of  discomfort in his chest.  It was the first time he has ever had it happen.  It started about a day and a half prior to when he presented.  Described as  central fullness in his chest.  He is a relatively difficult historian  secondary to his previous stroke, and so it is difficult to know exactly  what was the exacerbating set of symptoms.  However, he is currently pain-  free, feeling reasonably well.  He denies any dyspnea on exertion,  orthopnea, or PND.  He has no peripheral edema.   PAST MEDICAL HISTORY:  1. Coronary disease.  He had a bypass surgery in December of 2000, with a     LIMA to his LAD, saphenous vein graft to a first diagonal, saphenous vein     graft to an obtuse marginal, saphenous vein graft to his posterior     descending coronary artery, as well as a saphenous vein graft to his     posterolateral branch.  2. He has had a stroke in the past and appears to have some significant     sequelae as far as his speech impediment is concerned.  3. Diabetes.  4. Hypertension.  5. Hypothyroidism.  6. Status post left  carotid endarterectomy we think about a year ago.  7. He does have mild dysphagia status post esophageal dilatation back in     September of 2004.  He does have a history of erosive gastritis on an     upper endoscopy done at that time.  8. He has no history of congestive heart failure; however, his left     ventricular systolic function was described as depressed prior to his     bypass surgery.  I cannot find any details of an evaluation of his LV     function subsequent to his bypass surgery.   SOCIAL HISTORY:  He lives in [place] by himself but does have family  next door.  He is retired.  He used to work for [employer].  He does  have five children, all of whom are heavily involved in his health care.  He  used to smoke pretty heavily and has about a 55 to 60-pack-year smoking  history but quit about five years ago after his bypass surgery.  He does not  drink, and he does not use any illicit drugs.   FAMILY HISTORY:  His mother died at age 37 of unknown causes.  Father died  in his 2s of heart trouble.  He has two brothers and two sisters who are  still alive and have no significant heart disease.   MEDICATIONS PRIOR TO ADMISSION:  1. Aspirin 325 mg a day.  2. Potassium chloride 20 mEq three times a day.  3. Imipramine 50 mg at night.  4. Synthroid 25 mcg once a day.  5. Allopurinol 300 mg a day.  6. Aggrenox twice a day.  7. Amaryl 4 mg a day.  8. Hydralazine 25 mg twice a day.  9. Flomax 0.4 mg twice a day.  10.      Lasix 80 mg b.i.d.  11.      Coreg 25 mg b.i.d.  12.      Lipitor 20 mg a day.  13.      Xanax 0.5 mg four times a day.  14.      Lantus insulin.  He takes 15 units in the evening.  15.      Dilantin 300 mg at night.   REVIEW OF SYSTEMS:  Generally negative, with the exception of some  moderately severe constipation which has been appropriately treated here in  the hospital, and he has had resolution of those symptoms.  Otherwise,  generally  negative.   PHYSICAL EXAMINATION:  GENERAL:  He is a well-developed, well-nourished  white man in no apparent distress.  He is alert and oriented x4.  He is a  difficult historian in that he has quite a bit of difficulty finding words  to express himself, but he can speak.  VITAL SIGNS:  Blood pressure 127/70, heart rate 64 in sinus, respiratory  rate 20, and he is afebrile.  HEENT:  Unremarkable.  NECK:  Supple.  There is no jugular venous distension.  He does have a soft  left carotid bruit.  CARDIOVASCULAR:  Regular with no murmur.  His point of maximal impulse is  not palpable.  LUNGS:  Clear to auscultation bilaterally.  ABDOMEN:  He has a soft, nontender abdomen.  His GU and rectal exam were  deferred.  EXTREMITIES:  His pulses are all 1+ throughout without any bruits.  He has  no clubbing, cyanosis, or edema of his lower extremities.  NEUROLOGIC:  Grossly intact, with the exception of the speech problems  previously described.   LABORATORY DATA:  Chest x-ray shows mild cardiomegaly but no obvious  evidence of congestive heart failure.  His electrocardiogram is markedly  abnormal, with sinus rhythm at a rate of 69, with first-degree AV block, P-R  intervals 220 milliseconds.  He has a right axis deviation with a right  bundle branch block, with a Q-R restoration of 160 milliseconds.  His Q-T  interval is prolonged at 501 milliseconds.  He has old inferior Q's  consistent with a myocardial infarction.   White blood cell count 6.0, H&H 14 and 41, platelet count 167.  Sodium 136,  potassium 3.9, chloride 103, bicarb 27, BUN 23,  creatinine 1.5, blood sugar  174.  Liver function studies area all within normal limits.  Three sets of  cardiac enzymes are not consistent with acute myocardial infarction.  His D-  dimer is 0.25 which is within normal limits.  His Dilantin level was 15.1. No thyroid function studies have been obtained.   IMPRESSION AND PLAN:  This is a gentleman with  chest discomfort.  It is  difficult to tell whether it is typical or atypical, but my sense of things  is that this is pretty atypical for coronary disease.  He does have a  history of depressed left ventricular function.  He has diabetes,  hypertension, and hypothyroidism.  I think it is probably reasonable at this  point to assess his left ventricular function with an echocardiogram, as  this will help [place] target further workup.  If his echocardiogram is  unrevealing and his left ventricular function appears normal and if there is  no obvious significant and severe wall motion abnormalities other than that  typically found with a right bundle branch block, then I think we can  probably proceed with a perfusion study.  If on the other hand he has  evidence of depressed left ventricular function, then I think it is probably  reasonable to pursue invasive assessment, as he does have significant risk  factors and has been lost to followup.      ___________________________________________                                            Donald Owen, M.D.   JH/MEDQ  D:  11/09/2003  T:  11/09/2003  Job:  [id number]

## 2010-09-22 NOTE — Procedures (Signed)
NAME:  Donald Owen, Donald Owen                           ACCOUNT NO.:  [id number]   MEDICAL RECORD NO.:  [id number]                   PATIENT TYPE:  INP   LOCATION:  A218                                 FACILITY:  [facility]   PHYSICIAN:  Vida Roller, M.D.                DATE OF BIRTH:  12/22/1933   DATE OF PROCEDURE:  11/09/2003  DATE OF DISCHARGE:                                  ECHOCARDIOGRAM   CONSULTING PHYSICIAN:  Vida Roller, M.D.   PRIMARY CARE PHYSICIAN:  Edward L. Juanetta Gosling, M.D.   TAPE NUMBER:  [id number].   TAPE COUNT:  Is 0-491.   A 77 year old man with coronary disease, status post bypass surgery for LV  function.   TECHNICAL QUALITY:  Adequate.   M-MODE TRACING:  1. The aorta is 37-mm.  2. Left atrium is 51-mm.  3. The septum is 16-mm.  4. The posterior wall is 10-mm.  5. Left ventricular diastolic dimension is 59-mm.  6. Left ventricular systolic dimension is 41-mm.   IMAGING 2D AND DOPPLER:  1. The left ventricle is mildly dilated.  There is evidence of mildly     depressed left ventricular systolic function.  Estimated ejection     fraction is 45-50%.  There is inferior inferior posterior severe     hypokinesis.  The remainder of the walls appear to move normally.     Diastolic function was not assessed.  2. The right ventricle is normal size with normal systolic function.  3. Both atria appear to be mildly enlarged, the left greater than right.  4. The aortic valve is sclerotic with no stenosis or regurgitation.  5. The mitral valve has mild __________  calcification with trivial     insufficiency.  No stenosis is seen.  6. The tricuspid valve has mild insufficiency and no stenosis.  7. Pulmonic valve not well seen.  8. Pericardial structures appear normal.  9. Inferior vena cava appears to be normal size.  10.      Ascending aorta not well seen.      ___________________________________________                                            Vida Roller, M.D.   JH/MEDQ  D:  11/09/2003  T:  11/09/2003  Job:  [id number]

## 2010-09-22 NOTE — H&P (Signed)
NAME:  Donald Owen, Donald Owen                           ACCOUNT NO.:  [id number]   MEDICAL RECORD NO.:  [id number]                   PATIENT TYPE:  INP   LOCATION:  A201                                 FACILITY:  [facility]   PHYSICIAN:  Kingsley Callander. Ouida Sills, M.D.                  DATE OF BIRTH:  24-May-1933   DATE OF ADMISSION:  01/03/2003  DATE OF DISCHARGE:                                HISTORY & PHYSICAL   CHIEF COMPLAINT:  Weakness.   HISTORY OF PRESENT ILLNESS:  This patient is a 77 year old white male  patient of Edward L. Juanetta Gosling, M.D. who presented to the emergency room with  weakness.  He has had recent difficulty swallowing.  He has eaten very  little for four days and has consumed minimal fluids.  He has had difficulty  swallowing and has regurgitated liquids.  He was found to be markedly  dehydrated with a BUN and creatinine of 129 and 1.2 and was hypokalemia at  2.4.  His last bowel movement was earlier today.  He denies diarrhea.  He  has had problems with edema and has recently taken Zaroxolyn which was  discontinued three days ago.  He has been on Lasix 80 mg twice a day.  He  has had difficulty voiding and has been on Flomax b.i.d.  However, after a  catheter was placed here, his residual was approximately 250.   PAST MEDICAL HISTORY:  1. A stroke 7 weeks ago followed by left carotid endarterectomy in [place].     He has a residual right hemiparesis.  2. CAD status post bypass surgery three years ago.  3. Cholecystectomy.  4. Hypertension.  5. Diabetes.  6. Hypothyroidism.   MEDICATIONS:  1. Plavix 75 mg daily.  2. Hydrocodone p.r.n.  3. Flomax b.i.d.  4. Lasix 80 mg b.i.d.  5. Amaryl 4 mg daily.  6. Ambien 5 mg h.s.  7. Zantac 150 mg h.s.  8. Synthroid 150 mcg daily.  9. Hydralazine 25 mg b.i.d.  10.      Aspirin 325 mg daily.  11.      Potassium 10 mEq two t.i.d.  12.      Lantus 15 units h.s.   ALLERGIES:  None.   SOCIAL HISTORY:  He does not smoke cigarettes.  He  does not drink alcohol.   FAMILY HISTORY:  His mother had heart failure.  His father had emphysema.  He has a daughter with diabetes.   REVIEW OF SYSTEMS:  He denied fever, cough, chest pain, difficulty  breathing.   PHYSICAL EXAMINATION:  VITAL SIGNS:  Temperature 96.7, blood pressure 94/43,  pulse 62, respirations 22.  GENERAL:  Alert, comfortable white male.  HEENT:  His conjunctivae are mildly injected.  His oropharynx is actually  fairly moist at present.  NECK:  There is no JVD or thyromegaly.  Has a well-healed left carotid  endarterectomy  scar.  LUNGS:  Clear.  HEART:  Regular with no murmurs.  ABDOMEN:  Nontender with no hepatosplenomegaly.  GENITOURINARY:  A Foley catheter is in place.  EXTREMITIES:  No clubbing, cyanosis, or edema.  NEUROLOGICAL:  He has a residual partial right hemiparesis.   LABORATORY DATA:  White count 9.5, hemoglobin 14.7, platelets 239.  Sodium  131, potassium 2.4, bicarb 233, glucose 220, BUN 129, creatinine 1.2,  calcium 10.3.   IMPRESSION:  1. Dehydration.  He has had difficulty swallowing and difficulty voiding.  A     Foley catheter is in place.  He will be treated with IV fluids.  Will     obtain consults with the gastroenterologist and the speech therapist.  2. Recent stroke.  Continue aspirin and Plavix.  3. Hypokalemia.  Will supplement intravenously.  4. Diabetes.  Treat with sliding scale Humalog.  Will hold his Amaryl and     Lantus.                                               Kingsley Callander. Ouida Sills, M.D.    ROF/MEDQ  D:  01/03/2003  T:  01/03/2003  Job:  [id number]   cc:   [name], M.D.  [address]  [place]  [place] [address]  Fax: [phone]

## 2010-09-22 NOTE — Group Therapy Note (Signed)
   NAME:  Donald Owen, Donald Owen                           ACCOUNT NO.:  [id number]   MEDICAL RECORD NO.:  [id number]                   PATIENT TYPE:  INP   LOCATION:  A201                                 FACILITY:  [facility]   PHYSICIAN:  Edward L. Juanetta Gosling, M.D.             DATE OF BIRTH:  1933-07-27   DATE OF PROCEDURE:  01/07/2003  DATE OF DISCHARGE:                                   PROGRESS NOTE   PROBLEMS:  1. Acute renal failure.  2. Chronic obstructive pulmonary disease.  3. Chronic renal failure.  4. Congestive heart failure.  5. Diabetes.  6. Dysphagia.   SUBJECTIVE:  Donald Owen says he is feeing better.  He had EGD by Dr. Karilyn Cota  this morning and he is a little sedated still.  His temperature is 97.5,  pulse 65, respirations 20, blood sugar between 139 and 160, blood pressure  130/72.  His chest is pretty clear, his heart is regular; as mentioned, he  is a little sluggish.  His BUN and creatinine were down to 43 and 2.0  respectively yesterday, which is much improved and getting close to his  baseline.   ASSESSMENT:  He is much improved.   PLAN:  I am hopeful that he will be able to be discharged home  probably  tomorrow, depending on how he does after the EGD.                                               Edward L. Juanetta Gosling, M.D.    ELH/MEDQ  D:  01/07/2003  T:  01/07/2003  Job:  [id number]

## 2010-09-22 NOTE — Discharge Summary (Signed)
NAME:  Donald Owen, Donald Owen                           ACCOUNT NO.:  [id number]   MEDICAL RECORD NO.:  [id number]                   PATIENT TYPE:  INP   LOCATION:  A218                                 FACILITY:  [facility]   PHYSICIAN:  Edward L. Juanetta Gosling, M.D.             DATE OF BIRTH:  [birth year]   DATE OF ADMISSION:  11/08/2003  DATE OF DISCHARGE:                                 DISCHARGE SUMMARY   FINAL DISCHARGE DIAGNOSES:  1. Chest pain, myocardial infarction ruled out.  2. Status post stroke.  3. Coronary artery occlusive disease.  4. Diabetes mellitus.  5. Seizures following the stroke.  6. Chronic back pain.  7. Hypertension.  8. Hyperlipidemia.   This is a 77 year old who came to the emergency room because of chest  discomfort.  He had been in his usual state of health until the chest  discomfort started.  It went around to his sternal area.  It was a pressure  like pain.  It was intermittent.   PHYSICAL EXAMINATION:  GENERAL:  Showed he was awake, alert.  VITAL SIGNS:  Blood pressure 125/56, pulse 58, respirations 20.  CHEST:  Clear.  He had a midline sternotomy scar.  HEART:  Regular.   Lab work showed a BNP was 77.  Dilantin level 15.  Troponin 0.01, CK 42, MB  of 2.9.   HOSPITAL COURSE:  He was admitted, had serial electrocardiograms and cardiac  enzymes.  Cardiology consultation was obtained, and he underwent a  Cardiolite graded exercise test on the 6th which was negative for ischemia.   He was discharged home on his regular medications which include:  1. Allopurinol 300 mg daily.  2. Aspirin 325 mg daily.  3. Coreg 25 mg p.o. b.i.d.  4. Aggrenox one b.i.d.  5. Lipitor 20 mg b.i.d.  6. Flomax 0.4 mg daily.  7. Synthroid 0.125 mg daily.  8. Lasix 80 mg b.i.d.  9. Hydralazine 25 mg b.i.d.  10.      Amaryl 4 mg daily.  11.      Dilantin 100 mg three tablets at bedtime.  12.      Xanax 0.5 mg t.i.d. p.r.n.  13.      Imipramine 50 mg at bedtime.  14.      Lortab one  p.o. q.i.d. p.r.n. pain.  15.      Potassium chloride 10 mEq t.i.d. two tablets.  16.      Lantus insulin 15 units at bedtime.     ___________________________________________                                         Oneal Deputy Juanetta Gosling, M.D.   Gwenlyn Found  D:  11/10/2003  T:  11/10/2003  Job:  [id number]

## 2010-09-22 NOTE — Group Therapy Note (Signed)
NAME:  Donald Owen, FALLIN                           ACCOUNT NO.:  [id number]   MEDICAL RECORD NO.:  [id number]                   PATIENT TYPE:  INP   LOCATION:  A218                                 FACILITY:  [facility]   PHYSICIAN:  Edward L. Juanetta Gosling, M.D.             DATE OF BIRTH:  1933/08/29   DATE OF PROCEDURE:  DATE OF DISCHARGE:                                   PROGRESS NOTE   PROBLEM:  Chest pain.   SUBJECTIVE:  Mr. Anello says that he is doing okay and has had no further  chest pain.  He has no complaints this morning.  He says that he is  breathing okay.  He had an echocardiogram done and he says that he was told  that his ejection fraction is greater than 50% and, if this is indeed the  situation, that is, of course, good.   PHYSICAL EXAMINATION:  GENERAL:  He is alert, oriented and looks  comfortable.  VITAL SIGNS:  His temperature is 96.9; pulse 57; respirations 20; blood  sugar 138 (it has been as high as 167); blood pressure 131/64.  CHEST:  Clear.  HEART:  Regular.  ABDOMEN:  Soft.   ASSESSMENT:  He has known coronary artery occlusive disease.  He is being  worked up for the possibility that he has some sort of an infarction.  He  has peripheral vascular disease with multiple interventions.  He has had a  stroke, which is stable.  He has diabetes, which is under good control,  hypertension, which is under good control, and a history of congestive heart  failure.   PLAN:  Cardiolite stress test today.   ADDENDUM:  The echocardiogram official report shows 45 to 50% ejection  fraction and inferior and posterior hypokinesis.      ___________________________________________                                            Oneal Deputy Juanetta Gosling, M.D.   Gwenlyn Found  D:  11/10/2003  T:  11/10/2003  Job:  [id number]

## 2010-09-22 NOTE — Op Note (Signed)
[facility]  Patient:    Donald Owen                    MRN: [id number] Proc. Date: 04/25/99 Adm. Date:  [id number] Attending:  Rollene Rotunda CC:         Lewayne Bunting, M.D.             Rollene Rotunda, M.D. [facility]             Kari Baars, M.D.             Butch Penny, M.D.             [facility]                           Operative Report  PREOPERATIVE DIAGNOSIS:  Severe three-vessel coronary artery disease, status post acute myocardial infarction with class IV congestive heart failure and depressed left ventricular function.  POSTOPERATIVE DIAGNOSIS:  Severe three-vessel coronary artery disease, status post acute myocardial infarction with class IV congestive heart failure and depressed left ventricular function.  PROCEDURE:  Median sternotomy for a coronary artery bypass grafting x 5 (Left internal mammary artery to distal left anterior descending coronary artery, saphenous vein graft to first diagonal branch, saphenous vein graft to first circumflex marginal branch, sequential saphenous vein graft to posterior descending coronary artery, and sequential saphenous vein graft to right posterolateral branch).  SURGEON: Salvatore Decent. Cornelius Moras, M.D.  ASSISTANT:  Areta Haber, P.A.  ANESTHESIA:  General.  BRIEF CLINICAL NOTE:  The patient is a 77 year old, obese, white male from [place], [place] followed by Dr. Kari Baars and referred by Dr. Rollene Rotunda and Dr. Lewayne Bunting for management of coronary artery disease. The patient has a history of hypertension, severe tobacco abuse, and positive family history of coronary artery disease, but no known previous cardiac history. The patient was admitted April 18, 1999 to [facility] with chest pain and noted to have positive cardiac enzymes consistent with recent acute myocardial infarction.  The patient was transferred to [facility], where he  underwent cardiac catheterization.  This demonstrated severe three-vessel coronary artery disease  with depressed left ventricular function, elevated LV and diastolic pressure, estimated approximately 40 mmHg.  The patient was also noted to have mild renal  insufficiency.  OPERATIVE CONSENT:  The patient and his family were counselled at length regarding the indications and potential benefits of coronary artery bypass grafting. They understands the associated risks of surgery, including but not limited to, risks of death, stroke, myocardial infarction, bleeding requiring blood transfusion, arrhythmia, infection, and recurrent coronary artery disease.  They accepts these risks as well as any unforeseen complications and agreed to proceed with the surgery as described.  OPERATIVE NOTE IN DETAIL:  The patient was brought to the operating room on the  above mentioned date, and invasive hemodynamic monitoring was established by the anesthesia service under the care and direction of Dr. Janetta Hora. [place]. The patient was placed in the supine position on the operating table.  Following induction with general endotracheal anesthesia, the patients chest, abdomen, both groins, and both lower extremities were prepared and draped in a sterile manner.  A preoperative transesophageal echocardiogram was performed by Dr. Gelene Mink. This demonstrated moderate left ventricular dysfunction with severe inferior wall hypokinesis and mild global hypokinesis.  There was severe left ventricular hypertrophy.  There was only trace mitral regurgitation.  No other  significant valvular abnormalities were identified.  A median sternotomy incision was performed, and the left internal mammary artery was dissected from the chest wall and prepared for bypass grafting.  The left internal mammary artery was notably a good quality conduit for bypass grafting.  Simultaneously, saphenous vein was obtained  from the patients right lower extremity through a series of longitudinal incisions.  The saphenous vein was notably a good quality conduit for bypass grafting, although somewhat large caliber in places.  The patient was heparinized systemically.  The pericardium was opened.  The patient had a very large heart, which was covered with a large amount of epicardial fat.  The patient has a foreshortened ascending aorta.  The ascending aorta was palpated and was notably free of palpable plaques or calcifications.  The ascending aorta and the right atrial appendage were cannulated for cardiopulmonary bypass.  Adequate heparinization was verified.  Cardiopulmonary bypass was begun, and the surface of the heart was inspected. Distal sites were selected for coronary bypass grafting.  Of note, all of the diagonal branches and circumflex marginal branches were intramyocardial.  The heart was severely hypertrophied, and there was marbling throughout the wall of the left ventricle consistent with old myocardial infarctions, particularly in the inferior wall.  Portions of saphenous vein and the left internal mammary artery were trimmed to appropriate lengths.  A temperature probe was placed in the left ventricular  septum.  A styrofoam pad was placed to protect the left phrenic nerve from thermal injury.  A cardioplegia catheter was placed in the ascending aorta.  The patient was cooled to 28 degrees systemic temperature.  The aortic Cross clamp was applied, and cardioplegia was delivered in an antegrade fashion through the  aortic root.  Additional doses of cardioplegia were administered both through the aortic root and down subsequently placed vein grafts to maintain septal temperature below 15 degrees Centigrade throughout the Cross clamp portion of the operation. The following distal coronary anastomoses were performed: 1. The posterior descending coronary artery was grafted with a  saphenous vein graft  in a side-to-side fashion using running 7-0 Prolene suture.  This coronary measured 1.3 mm in diameter and was of fair quality. 2. The posterolateral branch off of the distal right coronary artery was grafted with a sequential saphenous vein graft using the vein placed at the posterior descending coronary artery.  This coronary measured 1.4 mm in diameter and was f fair to good quality at the site of the distal bypass. 3. The first circumflex marginal branch was grafted with a saphenous vein graft in an end-to-side fashion using running 7-0 Prolene suture.  This coronary measured 2 mm in diameter and was of good quality, but was intramyocardial at the site of distal bypass. 4. The first diagonal branch off the left anterior descending coronary artery was grafted with a saphenous vein graft in an end-to-side fashion using running 7-0 Prolene suture.  This coronary measured 1 mm in diameter and was of fair to  poor quality at the site of distal bypass. 5. The distal left anterior descending coronary artery was grafted with the left internal mammary artery using running 8-0 Prolene suture.  This coronary measured 2 mm in diameter and was of good quality at the site of distal bypass.  The septal temperature was noted to rise rapidly and dramatically upon reperfusion of the eft internal mammary artery.  The heart began to beat spontaneously.  The aortic Cross clamp was removed after a total Cross clamp time  of 82 minutes.  The patient was rewarmed to greater than 37 degrees Centigrade temperature. All three proximal saphenous vein anastomoses were performed to the ascending aorta  under a separate partial occlusion clamp.  All proximal and distal anastomoses ere inspected for hemostasis and appropriate graft orientation.  Epicardial pacing wires were fixed to the right ventricular outflow tract and to the right atrial  appendage.  The patient was weaned  from cardiopulmonary bypass without difficulty. Total cardiopulmonary bypass time for the operation was 168 minutes.  The patients rhythm at separation from bypass was sinus rhythm with atrial pacing for rate only. The patient was weaned from bypass on low-dose dopamine and milrinone infusions.  The venous and arterial cannula were removed uneventfully.  Protamine was administered to reverse the anticoagulation.  Followup transesophageal echocardiogram performed by Dr. Gelene Mink demonstrated somewhat improved left ventricular function.  There still remained some hypokinesis in the inferior wall, but overall contractility seemed to be improved somewhat.  No other abnormalities were identified.  The mediastinum and the left chest were irrigated with saline solution containing vancomycin antibiotic.  Meticulous surgical hemostasis was ascertained.  The mediastinum and the left chest were drained with three chest tubes placed through separate stab incisions inferiorly.  The median sternotomy was closed in a routine fashion.  The right lower extremity incisions were closed in multiple layers in  routine fashion.  The lower extremity skin incisions were closed with skin staples, where as the chest incision was closed with a subcuticular skin closure.  The patient tolerated the procedure well and was transported to the surgical intensive care unit in stable condition.  There were no intraoperative complications.  All sponge, needle, and instrument counts were verified correct at the completion of the operation.  No autologous blood products were administered. DD:  04/25/99 TD:  04/27/99 Job: [id number]

## 2010-09-22 NOTE — Consult Note (Signed)
NAME:  Donald Owen, Donald Owen                           ACCOUNT NO.:  [id number]   MEDICAL RECORD NO.:  [id number]                   PATIENT TYPE:  INP   LOCATION:  A201                                 FACILITY:  [facility]   PHYSICIAN:  R. Roetta Sessions, M.D.              DATE OF BIRTH:  01-28-1934   DATE OF CONSULTATION:  01/04/2003  DATE OF DISCHARGE:                                   CONSULTATION   REQUESTING PHYSICIAN:  Oneal Deputy. Juanetta Gosling, M.D.   HISTORY OF PRESENT ILLNESS:  The patient is a 77 year old Caucasian  gentleman who was admitted with dehydration and hypokalemia.  He had a CVA  with right-sided hemiparesis seven weeks ago.  Since then he has had a left  carotid endarterectomy in [place], [place].  The patient denies any  dysphagia prior to his stroke; however, the last four to five days has had  difficulty swallowing solid foods.  He states he cannot chew solids foods;  the bolus gets larger preventing him from swallowing this.  He has no  difficulty with liquids or soft foods.  Since his stroke on November 08, 2002 he  has lost about 40 pounds.  He denies any abdominal pain, heartburn symptoms,  regurgitation, constipation, diarrhea, melena, or rectal bleeding.   On admission he was found to have a BUN of 129, creatinine of 1.2, potassium  2.4, sodium 131.  He was also complaining of having voiding difficulties.  Postvoid residual revealed 200 mL of urine.  He has been on Zaroxolyn up  until three days ago but continues to be on Lasix 80 mg daily for fluid  retention.   I spoke with Dr. Juanetta Gosling today who tells me that the patient was complaining  of painful swallowing last week.  He was concerned he may have esophageal  candidiasis, given his multiple comorbidities and recent antibiotic use.   CURRENT MEDICATIONS:  1. Lipitor daily.  2. Plavix 75 mg daily.  3. Hydrocodone p.r.n.  4. Flomax b.i.d.  5. Lasix 80 mg b.i.d.  6. Amaryl 4 mg daily.  7. Ambien 5 mg daily.  8. Zantac  150 mg daily.  9. Synthroid 150 mcg daily.  10.      Hydralazine 25 mg b.i.d.  11.      Aspirin 325 mg daily.  12.      Potassium 10 mEq two t.i.d.  13.      Lantus 15 units q.h.s.   ALLERGIES:  No known drug allergies.   PAST MEDICAL HISTORY:  1. CVA seven weeks ago with right-sided hemiparesis.  Status post left     carotid endarterectomy post stroke.  2. Coronary artery disease status post CABG four years ago.  3. Diabetes mellitus diagnosed four years ago.  4. Hypertension.  5. Hypothyroidism.  6. Hypercholesterolemia.  7. Cholecystectomy.   FAMILY HISTORY:  Mother had CHF.  Father had emphysema.  He has  a daughter  with diabetes mellitus.  No family history of colorectal cancer or chronic  GI illnesses.   SOCIAL HISTORY:  He currently lives with his daughter and son-in-law.  He  quit smoking four years ago.  Denies any alcohol use.   REVIEW OF SYSTEMS:  Please see HPI for GI, for GU.  CARDIOPULMONARY:  Denies  any chest pain or shortness of breath.   PHYSICAL EXAMINATION:  VITAL SIGNS:  Weight 207.7, height 5 feet 8 inches.  Temperature 96.9, pulse 65, respirations 18, blood pressure 101/55.  GENERAL:  Pleasant, mildly obese Caucasian male in no acute distress.  SKIN:  Warm and dry, no jaundice.  HEENT:  Conjunctivae are pink, sclerae nonicteric.  Oropharyngeal mucosa  moist and pink.  No lesions, erythema, or exudate.  No lymphadenopathy,  thyromegaly.  CHEST:  Lungs are clear to auscultation.  CARDIAC:  Reveals regular rate and rhythm, normal S1, S2.  No murmurs, rubs,  or gallops.  ABDOMEN:  Positive bowel sounds, slightly obese but symmetrical, soft.  Nontender.  No organomegaly or masses appreciative.  EXTREMITIES:  No edema.   LABORATORY DATA:  As mentioned in HPI.  In addition, wbc 9.5; hemoglobin  14.7; hematocrit 43.8; platelets 239,000.  Sodium 137, potassium 2.9, BUN  110, creatinine 2.7.  TSH 2.027.  Calcium 10.3.  EKG revealed sinus rhythm  with a rate  of 58, first degree A-V block.   IMPRESSION:  The patient is a pleasant 77 year old Caucasian gentleman who  presents with dysphagia six weeks following a recent CVA.  At this time it  is unclear whether symptoms were secondary to oropharyngeal and/or  esophageal abnormalities.  He apparently swallows liquids and soft foods  well which would be inconsistent with oropharyngeal dysphagia.  After  discussion with Dr. Jena Gauss initially we plan to proceed with a barium pill  esophogram to determine the need for EGD with possible dilatation.  At this  time the patient denies any odynophagia; however, if this seems to be an  issue he may need to have an upper endoscopy at any rate.   SUGGESTIONS:  1. Barium pill esophagram initially.  2.     Continue Protonix for now.  3. Agree with potassium supplementation and recheck BMET in the morning.   I would like to thank Dr. Juanetta Gosling for allowing [place] to take part in the care  of this patient.     Tana Coast, Pricilla Larsson, M.D.    LL/MEDQ  D:  01/04/2003  T:  01/04/2003  Job:  [id number]   cc:   [name], M.D.  [address]  [place]  [place] [address]  Fax: [phone]

## 2010-09-22 NOTE — Discharge Summary (Signed)
NAME:  Donald Owen, Donald Owen                           ACCOUNT NO.:  [id number]   MEDICAL RECORD NO.:  [id number]                   PATIENT TYPE:  INP   LOCATION:  A201                                 FACILITY:  [facility]   PHYSICIAN:  Edward L. Juanetta Gosling, M.D.             DATE OF BIRTH:  Apr 05, 1934   DATE OF ADMISSION:  01/03/2003  DATE OF DISCHARGE:  01/07/2003                                 DISCHARGE SUMMARY   FINAL DISCHARGE DIAGNOSES:  1. Dehydration.  2. Acute renal failure.  3. Chronic renal failure.  4. Congestive heart failure.  5. Coronary artery occlusive disease.  6. Diabetes.  7. Peripheral vascular disease, status post carotid endarterectomy.  8. Stroke.  9. Hypertension.  10.      Dysphagia, multiple causes.  11.      Electrolyte imbalances.   HISTORY:  Donald Owen is a 77 year old who has had a long known history of  multiple medical problems including coronary disease, congestive heart  failure, diabetes, peripheral vascular disease, and has recently had a  carotid endarterectomy.  He has mild chronic renal failure.  Over the last  two weeks he has gotten to the point that he has not been able to eat or  drink.  He complains of some pain with swallowing, but his history is  somewhat variable.  He continued to have difficulties, came to the emergency  room where he was found to be dehydrated, and has eaten very little for  about four days.  Had only minimal fluid.  He has been regurgitating, and he  was shown to have a BUN of 129, creatinine of 1.2.  He was hypokalemic at  2.4.   His exam showed a well-developed, well-nourished male who was confused, and  his speech was somewhat sluggish.  He appeared to be markedly dehydrated by  physical exam.  His blood pressure was around 120/80, his pulse was 100.  His chest was clear.  His speech was somewhat garbled and difficult to  understand.  Assessment at the time of admission was that he had become  dehydrated because of his  multiple medical problems and his difficulty with  swallowing.   He was treated with intravenous fluids and had excellent response getting  his BUN and creatinine back down to baseline.  He had evaluation by the GI  team and by the speech therapy team and was found to have difficulty with  swallowing large boluses of fluid and had what may have been an esophageal  stricture.  He underwent EGD and  dilatation on the 2nd and improved.  He then became very panicky and  demanded discharge, and since he was planned to be discharged the next day  anyway, he was discharged home to resume his previous medications and to  follow up in my office in about a month and follow up with home health  services in the meantime.  Edward L. Juanetta Gosling, M.D.    ELH/MEDQ  D:  01/08/2003  T:  01/08/2003  Job:  [id number]

## 2010-09-22 NOTE — H&P (Signed)
NAME:  Donald Owen, Donald Owen                           ACCOUNT NO.:  [id number]   MEDICAL RECORD NO.:  [id number]                   PATIENT TYPE:  INP   LOCATION:  A218                                 FACILITY:  [facility]   PHYSICIAN:  Tesfaye D. Felecia Owen, M.D.              DATE OF BIRTH:  Jul 19, 1933   DATE OF ADMISSION:  11/08/2003  DATE OF DISCHARGE:                                HISTORY & PHYSICAL   CHIEF COMPLAINT:  Chest pain.   HISTORY OF PRESENT ILLNESS:  This is a 77 year old male patient of Dr.  Ramon Dredge L. Juanetta Owen who came to emergency room with above complaints.  The  patient was in his usual state of health until last night, when he started  having midsternal chest pain which was lasting for 5-10 minutes.  The pain  was spreading around his sternal borders.  The pain was like a pressure and  was intermittent.  There was no radiation to his arm or neck area.  The  patient has not taken the nitroglycerin or any other medication to relieve  the pain.  It has continued to recur until this morning, when he came to the  emergency room.  The patient was evaluated in the emergency room and was  admitted for further treatment.  The patient has a history of coronary  artery disease and is status post coronary artery bypass surgery about 5  years back.  The patient has had no chest pain since his surgery.  His last  followup with the cardiologist was about 3 years back.   REVIEW OF SYSTEMS:  No fever, headache, nausea, vomiting, diaphoresis,  abdominal pain, cough, shortness of breath, dysuria, urgency or frequency of  urination.   PAST MEDICAL HISTORY:  1. Coronary artery disease and coronary artery bypass surgery about 5 years     back.  2. History of CVA.  3. Diabetes mellitus.  4. Hypertension.  5. Hypothyroidism.  6. Carotid artery disease and status post left carotid endarterectomy.  7. History dysphagia.   CURRENT MEDICATIONS:  1. Aspirin 325 mg p.o. daily.  2. Kay Ciel 40 mEq p.o.  b.i.d.  3. Imipramine 50 mg p.o. nightly.  4. Synthroid 125 mcg p.o. daily.  5. __________  7.5/500 mg 1 tablet p.o. 4 times daily p.r.n. for pain.  6. Allopurinol 300 mg p.o. daily.  7. Aggrenox AR 1 tablet p.o. b.i.d.  8. Amaryl 4 mg p.o. daily.  9. Hydralazine 25 mg p.o. b.i.d.  10.      Flomax 0.4 mg p.o. b.i.d.  11.      Lasix 80 mg p.o. b.i.d.  12.      __________  100 mg 3 tablets p.o. nightly.  13.      Coreg 25 mg p.o. b.i.d.  14.      Lipitor 20 mg p.o. daily.  15.      Xanax 0.5 mg p.o. 4  times daily.  16.      Insulin Lantus 50 units p.o. nightly.   SOCIAL HISTORY:  The patient stopped smoking about 5 years back.  No history  of alcohol or substance abuse.   PHYSICAL EXAMINATION:  GENERAL/VITAL SIGNS:  The patient is alert, awake and  pleasant-looking, whose vitals show a blood pressure 125/56, pulse 58,  respiratory rate 20, temperature 97 degrees Fahrenheit.  HEENT:  Pupils are equal and reactive.  NECK:  Neck is supple.  CHEST:  There is a midline healed surgical scar.  Good air entry, clear lung  fields.  CARDIOVASCULAR SYSTEM:  First and second heart sounds heard.  No murmur, no  gallop.  ABDOMEN:  Abdomen is soft and relaxed.  Bowel sounds are positive.  No mass  or organomegaly.  EXTREMITIES:  No leg edema.   LABORATORIES ON ADMISSION:  WBC 6.0, hemoglobin 14.3, hematocrit 41.3,  platelets 167,000.  PT 12.6, INR 0.9, PTT 29.  Sodium 136, potassium 3.9,  chloride 103, carbon dioxide 27, glucose 174, BUN 23, creatinine 1.5,  calcium 9.5.  CPK total 42, CK-MB 29, troponin 0.01.  Dilantin level 15.1.  BNP 77.1.   ASSESSMENT:  This is a 77 year old male patient who has a history of  multiple medical illnesses including coronary artery disease, carotid artery  disease and diabetes mellitus, who had previous coronary artery bypass  surgery and carotid endarterectomy, who came with complaint of intermittent  chest pain.  Currently, the patient is relieved from his  pain and is  comfortable.  The patient's initial cardiac enzymes and EKG are negative for  acute ischemia.  However, the patient has multiple risk factors for coronary  artery disease and he had previous myocardial infarction.  The patient has  had no cardiology followup for the last 3 years.  It will be reasonable to  do serial cardiac enzymes and EKGs to rule out acute coronary syndrome.  The  patient also needs cardiology consult for further workup.   PLAN:  The patient will be admitted under telemetry.  We will do EKG and  cardiac enzymes q.8 h. x3.  We will do cardiology consult.  We will continue  the patient on aspirin and Aggrenox for anticoagulation and we will continue  the patient on his regular medications.     ___________________________________________                                         Donald Owen, M.D.   TDF/MEDQ  D:  11/08/2003  T:  11/08/2003  Job:  [id number]   cc:   Donald Owen, M.D.  [address]  [place]  [place] [address]  Fax: [phone]

## 2010-09-22 NOTE — Procedures (Signed)
NAME:  Donald Owen, Donald Owen                           ACCOUNT NO.:  [id number]   MEDICAL RECORD NO.:  [id number]                   PATIENT TYPE:  INP   LOCATION:  A218                                 FACILITY:  [facility]   PHYSICIAN:  Vida Roller, M.D.                DATE OF BIRTH:  1933-11-30   DATE OF PROCEDURE:  DATE OF DISCHARGE:                                    STRESS TEST   PROCEDURE:  Adenosine Cardiolite.   INDICATIONS:  Mr. Deveney is a 77 year old male with known coronary artery  disease status post coronary artery bypass grafting in December of 2000 with  the following grafts:  LIMA to LAD, SVG to D1, SVG to first marginal, SVG to  PDA, SVG to PLA.  The patient also has history of inferior myocardial  infarction with an EF of 25-30% at the time of his CABG in 2000. He now  presents with atypical chest discomfort.  His cardiac enzymes are negative  x3 for acute myocardial infarction and his echocardiogram revealed an EF of  50% with inferior hypokinesis.   BASELINE DATA:  EKG revealed sinus rhythm at 58 beats/minute with PVC and  right bundle branch block.  Baseline  blood pressure is 130/78.   STRESS DATA:  59 mg of adenosine was infused over a 4 minute protocol with  Cardiolite injected at 3 minutes. The patient reported numbness all over  which resolved in recovery.  EKG revealed a few PACs and PVCs, no ischemia  was noted.   Final images and results are pending MD review.     ________________________________________  ___________________________________________  Jae Dire, P.A. [facility]                      Vida Roller, M.D.   AB/MEDQ  D:  11/10/2003  T:  11/10/2003  Job:  [id number]

## 2010-10-27 ENCOUNTER — Emergency Department (HOSPITAL_COMMUNITY)
Admission: EM | Admit: 2010-10-27 | Discharge: 2010-10-27 | Discharge status: Against Medical Advice | Payer: Medicare Other | Attending: Emergency Medicine | Admitting: Emergency Medicine

## 2010-10-27 DIAGNOSIS — K5900 Constipation, unspecified: Secondary | ICD-10-CM | POA: Insufficient documentation

## 2011-05-21 ENCOUNTER — Encounter (HOSPITAL_COMMUNITY): Payer: Self-pay

## 2011-05-21 ENCOUNTER — Inpatient Hospital Stay (HOSPITAL_COMMUNITY)
Admission: EM | Admit: 2011-05-21 | Discharge: 2011-05-24 | DRG: 309 | Disposition: A | Discharge status: Home / Self Care | Payer: Medicare Other | Source: Ambulatory Visit | Attending: Cardiology | Admitting: Cardiology

## 2011-05-21 ENCOUNTER — Emergency Department (HOSPITAL_COMMUNITY): Payer: Medicare Other

## 2011-05-21 ENCOUNTER — Other Ambulatory Visit: Payer: Self-pay

## 2011-05-21 DIAGNOSIS — Z794 Long term (current) use of insulin: Secondary | ICD-10-CM

## 2011-05-21 DIAGNOSIS — I129 Hypertensive chronic kidney disease with stage 1 through stage 4 chronic kidney disease, or unspecified chronic kidney disease: Secondary | ICD-10-CM | POA: Diagnosis present

## 2011-05-21 DIAGNOSIS — E785 Hyperlipidemia, unspecified: Secondary | ICD-10-CM | POA: Diagnosis present

## 2011-05-21 DIAGNOSIS — I252 Old myocardial infarction: Secondary | ICD-10-CM

## 2011-05-21 DIAGNOSIS — K2970 Gastritis, unspecified, without bleeding: Secondary | ICD-10-CM | POA: Insufficient documentation

## 2011-05-21 DIAGNOSIS — E039 Hypothyroidism, unspecified: Secondary | ICD-10-CM | POA: Diagnosis present

## 2011-05-21 DIAGNOSIS — Z8673 Personal history of transient ischemic attack (TIA), and cerebral infarction without residual deficits: Secondary | ICD-10-CM | POA: Diagnosis present

## 2011-05-21 DIAGNOSIS — I5022 Chronic systolic (congestive) heart failure: Secondary | ICD-10-CM | POA: Diagnosis present

## 2011-05-21 DIAGNOSIS — Z951 Presence of aortocoronary bypass graft: Secondary | ICD-10-CM

## 2011-05-21 DIAGNOSIS — I2589 Other forms of chronic ischemic heart disease: Secondary | ICD-10-CM | POA: Diagnosis present

## 2011-05-21 DIAGNOSIS — M109 Gout, unspecified: Secondary | ICD-10-CM | POA: Diagnosis present

## 2011-05-21 DIAGNOSIS — I472 Ventricular tachycardia, unspecified: Principal | ICD-10-CM | POA: Diagnosis present

## 2011-05-21 DIAGNOSIS — I4729 Other ventricular tachycardia: Principal | ICD-10-CM | POA: Diagnosis present

## 2011-05-21 DIAGNOSIS — N189 Chronic kidney disease, unspecified: Secondary | ICD-10-CM | POA: Diagnosis present

## 2011-05-21 DIAGNOSIS — Z79899 Other long term (current) drug therapy: Secondary | ICD-10-CM

## 2011-05-21 DIAGNOSIS — G40909 Epilepsy, unspecified, not intractable, without status epilepticus: Secondary | ICD-10-CM | POA: Diagnosis present

## 2011-05-21 DIAGNOSIS — I509 Heart failure, unspecified: Secondary | ICD-10-CM | POA: Diagnosis present

## 2011-05-21 DIAGNOSIS — Z87891 Personal history of nicotine dependence: Secondary | ICD-10-CM

## 2011-05-21 DIAGNOSIS — I2510 Atherosclerotic heart disease of native coronary artery without angina pectoris: Secondary | ICD-10-CM | POA: Diagnosis present

## 2011-05-21 DIAGNOSIS — I679 Cerebrovascular disease, unspecified: Secondary | ICD-10-CM

## 2011-05-21 DIAGNOSIS — E119 Type 2 diabetes mellitus without complications: Secondary | ICD-10-CM | POA: Insufficient documentation

## 2011-05-21 HISTORY — DX: Type 2 diabetes mellitus without complications: E11.9

## 2011-05-21 HISTORY — DX: Gastritis, unspecified, without bleeding: K29.70

## 2011-05-21 HISTORY — DX: Hyperlipidemia, unspecified: E78.5

## 2011-05-21 HISTORY — DX: Cerebrovascular disease, unspecified: I67.9

## 2011-05-21 HISTORY — DX: Hypothyroidism, unspecified: E03.9

## 2011-05-21 HISTORY — DX: Unspecified convulsions: R56.9

## 2011-05-21 LAB — BASIC METABOLIC PANEL
BUN: 45 mg/dL — ABNORMAL HIGH (ref 6–23)
BUN: 49 mg/dL — ABNORMAL HIGH (ref 6–23)
CO2: 23 mEq/L (ref 19–32)
Calcium: 10.6 mg/dL — ABNORMAL HIGH (ref 8.4–10.5)
Chloride: 101 mEq/L (ref 96–112)
Creatinine, Ser: 2.11 mg/dL — ABNORMAL HIGH (ref 0.50–1.35)
GFR calc Af Amer: 34 mL/min — ABNORMAL LOW (ref >90)
GFR calc non Af Amer: 29 mL/min — ABNORMAL LOW (ref >90)
Glucose, Bld: 380 mg/dL — ABNORMAL HIGH (ref 70–99)
Glucose, Bld: 328 mg/dL — ABNORMAL HIGH (ref 70–99)
Potassium: 4.9 mEq/L (ref 3.5–5.1)
Potassium: 4.8 mEq/L (ref 3.5–5.1)

## 2011-05-21 LAB — DIFFERENTIAL
Basophils Absolute: 0 10*3/uL (ref 0.0–0.1)
Basophils Absolute: 0 10*3/uL (ref 0.0–0.1)
Basophils Relative: 0 % (ref 0–1)
Basophils Relative: 0 % (ref 0–1)
Eosinophils Absolute: 0.1 10*3/uL (ref 0.0–0.7)
Eosinophils Absolute: 0.1 10*3/uL (ref 0.0–0.7)
Monocytes Absolute: 0.7 10*3/uL (ref 0.1–1.0)
Monocytes Relative: 13 % — ABNORMAL HIGH (ref 3–12)
Monocytes Relative: 9 % (ref 3–12)
Neutro Abs: 5.9 10*3/uL (ref 1.7–7.7)
Neutrophils Relative %: 72 % (ref 43–77)

## 2011-05-21 LAB — POCT I-STAT TROPONIN I

## 2011-05-21 LAB — CBC
HCT: 37.9 % — ABNORMAL LOW (ref 39.0–52.0)
Hemoglobin: 12.6 g/dL — ABNORMAL LOW (ref 13.0–17.0)
MCH: 30 pg (ref 26.0–34.0)
MCH: 30.1 pg (ref 26.0–34.0)
MCHC: 33.1 g/dL (ref 30.0–36.0)
MCHC: 33.2 g/dL (ref 30.0–36.0)
MCV: 90.5 fL (ref 78.0–100.0)
Platelets: 157 10*3/uL (ref 150–400)
Platelets: 143 10*3/uL — ABNORMAL LOW (ref 150–400)
RDW: 14.3 % (ref 11.5–15.5)
RDW: 14.5 % (ref 11.5–15.5)
RDW: 14.2 % (ref 11.5–15.5)
WBC: 7.6 10*3/uL (ref 4.0–10.5)

## 2011-05-21 LAB — PRO B NATRIURETIC PEPTIDE: Pro B Natriuretic peptide (BNP): 15525 pg/mL — ABNORMAL HIGH (ref 0–450)

## 2011-05-21 LAB — HEPATIC FUNCTION PANEL
ALT: 34 U/L (ref 0–53)
Alkaline Phosphatase: 27 U/L — ABNORMAL LOW (ref 39–117)
Bilirubin, Direct: 0.1 mg/dL (ref 0.0–0.3)
Indirect Bilirubin: 0.2 mg/dL — ABNORMAL LOW (ref 0.3–0.9)

## 2011-05-21 LAB — CREATININE, SERUM
Creatinine, Ser: 2.13 mg/dL — ABNORMAL HIGH (ref 0.50–1.35)
GFR calc Af Amer: 33 mL/min — ABNORMAL LOW (ref >90)

## 2011-05-21 MED ORDER — FENOFIBRATE 160 MG PO TABS
160.0000 mg | ORAL_TABLET | Freq: Every day | ORAL | Status: DC
  Administered 2011-05-21 – 2011-05-24 (×4): 160 mg via ORAL
  Filled 2011-05-21 (×4): qty 1

## 2011-05-21 MED ORDER — PHENYTOIN SODIUM EXTENDED 100 MG PO CAPS: 100.0000 mg | ORAL_CAPSULE | Freq: Two times a day (BID) | ORAL | Status: DC

## 2011-05-21 MED ORDER — INSULIN GLARGINE 100 UNIT/ML ~~LOC~~ SOLN
13.0000 [IU] | Freq: Every day | SUBCUTANEOUS | Status: DC
  Administered 2011-05-21 – 2011-05-23 (×3): 13 [IU] via SUBCUTANEOUS
  Filled 2011-05-21: qty 3

## 2011-05-21 MED ORDER — LEVETIRACETAM 500 MG PO TABS
500.0000 mg | ORAL_TABLET | Freq: Two times a day (BID) | ORAL | Status: DC
  Administered 2011-05-21 – 2011-05-24 (×6): 500 mg via ORAL
  Filled 2011-05-21 (×7): qty 1

## 2011-05-21 MED ORDER — SODIUM CHLORIDE 0.9 % IV SOLN: 250.0000 mL | INTRAVENOUS | Status: DC | PRN

## 2011-05-21 MED ORDER — AMIODARONE HCL 150 MG/3ML IV SOLN
150.0000 mg | Freq: Once | INTRAVENOUS | Status: DC
  Filled 2011-05-21: qty 3

## 2011-05-21 MED ORDER — LEVOTHYROXINE SODIUM 200 MCG PO TABS
200.0000 ug | ORAL_TABLET | Freq: Every day | ORAL | Status: DC
  Administered 2011-05-21 – 2011-05-23 (×3): 200 ug via ORAL
  Filled 2011-05-21 (×3): qty 1

## 2011-05-21 MED ORDER — ADENOSINE 6 MG/2ML IV SOLN
INTRAVENOUS | Status: AC
  Administered 2011-05-21: 6 mg via INTRAVENOUS
  Filled 2011-05-21: qty 6

## 2011-05-21 MED ORDER — DILTIAZEM HCL 25 MG/5ML IV SOLN
INTRAVENOUS | Status: AC
  Administered 2011-05-21: 20 mg via INTRAVENOUS
  Filled 2011-05-21: qty 5

## 2011-05-21 MED ORDER — ROSUVASTATIN CALCIUM 40 MG PO TABS
40.0000 mg | ORAL_TABLET | Freq: Every day | ORAL | Status: DC
  Administered 2011-05-21 – 2011-05-24 (×4): 40 mg via ORAL
  Filled 2011-05-21 (×4): qty 1

## 2011-05-21 MED ORDER — SODIUM CHLORIDE 0.9 % IV SOLN
INTRAVENOUS | Status: DC
  Administered 2011-05-21: 10 mL/h via INTRAVENOUS

## 2011-05-21 MED ORDER — MIDAZOLAM HCL 5 MG/5ML IJ SOLN
INTRAMUSCULAR | Status: AC
  Filled 2011-05-21: qty 10

## 2011-05-21 MED ORDER — HEPARIN SOD (PORCINE) IN D5W 100 UNIT/ML IV SOLN
1850.0000 [IU]/h | INTRAVENOUS | Status: DC
  Administered 2011-05-21: 1550 [IU]/h via INTRAVENOUS
  Administered 2011-05-22: 1850 [IU]/h via INTRAVENOUS
  Filled 2011-05-21 (×3): qty 250

## 2011-05-21 MED ORDER — PHENYTOIN SODIUM EXTENDED 100 MG PO CAPS
100.0000 mg | ORAL_CAPSULE | Freq: Every day | ORAL | Status: DC
  Administered 2011-05-22 – 2011-05-24 (×3): 100 mg via ORAL
  Filled 2011-05-21 (×3): qty 1

## 2011-05-21 MED ORDER — ADENOSINE 6 MG/2ML IV SOLN
12.0000 mg | Freq: Once | INTRAVENOUS | Status: AC
  Administered 2011-05-21: 12 mg via INTRAVENOUS

## 2011-05-21 MED ORDER — HEPARIN SODIUM (PORCINE) 5000 UNIT/ML IJ SOLN
5000.0000 [IU] | Freq: Three times a day (TID) | INTRAMUSCULAR | Status: DC
  Filled 2011-05-21 (×2): qty 1

## 2011-05-21 MED ORDER — INSULIN ASPART 100 UNIT/ML ~~LOC~~ SOLN
0.0000 [IU] | Freq: Three times a day (TID) | SUBCUTANEOUS | Status: DC
  Administered 2011-05-22: 3 [IU] via SUBCUTANEOUS
  Administered 2011-05-22: 2 [IU] via SUBCUTANEOUS
  Administered 2011-05-22: 5 [IU] via SUBCUTANEOUS
  Administered 2011-05-23 (×3): 2 [IU] via SUBCUTANEOUS
  Administered 2011-05-24: 5 [IU] via SUBCUTANEOUS
  Administered 2011-05-24: 2 [IU] via SUBCUTANEOUS
  Filled 2011-05-21: qty 3

## 2011-05-21 MED ORDER — MIDAZOLAM HCL 5 MG/5ML IJ SOLN
INTRAMUSCULAR | Status: AC | PRN
  Administered 2011-05-21: 4 mg via INTRAVENOUS

## 2011-05-21 MED ORDER — SODIUM CHLORIDE 0.9 % IJ SOLN
3.0000 mL | Freq: Two times a day (BID) | INTRAMUSCULAR | Status: DC
  Administered 2011-05-21 – 2011-05-24 (×4): 3 mL via INTRAVENOUS

## 2011-05-21 MED ORDER — AMIODARONE IV BOLUS ONLY 150 MG/100ML
150.0000 mg | Freq: Once | INTRAVENOUS | Status: AC
  Administered 2011-05-21: 150 mg via INTRAVENOUS
  Filled 2011-05-21: qty 100

## 2011-05-21 MED ORDER — TAMSULOSIN HCL 0.4 MG PO CAPS
0.4000 mg | ORAL_CAPSULE | Freq: Every day | ORAL | Status: DC
  Administered 2011-05-21 – 2011-05-24 (×4): 0.4 mg via ORAL
  Filled 2011-05-21 (×4): qty 1

## 2011-05-21 MED ORDER — GLIMEPIRIDE 4 MG PO TABS
4.0000 mg | ORAL_TABLET | Freq: Every day | ORAL | Status: DC
Start: 2011-05-22 — End: 2011-05-24
  Administered 2011-05-22 – 2011-05-24 (×3): 4 mg via ORAL
  Filled 2011-05-21 (×4): qty 1

## 2011-05-21 MED ORDER — SODIUM CHLORIDE 0.9 % IJ SOLN: 3.0000 mL | INTRAMUSCULAR | Status: DC | PRN

## 2011-05-21 MED ORDER — ACETAMINOPHEN 325 MG PO TABS: 650.0000 mg | ORAL_TABLET | ORAL | Status: DC | PRN

## 2011-05-21 MED ORDER — ASPIRIN-DIPYRIDAMOLE ER 25-200 MG PO CP12
1.0000 | ORAL_CAPSULE | Freq: Two times a day (BID) | ORAL | Status: DC
  Administered 2011-05-21 – 2011-05-24 (×6): 1 via ORAL
  Filled 2011-05-21 (×7): qty 1

## 2011-05-21 MED ORDER — ONDANSETRON HCL 4 MG/2ML IJ SOLN: 4.0000 mg | Freq: Four times a day (QID) | INTRAMUSCULAR | Status: DC | PRN

## 2011-05-21 MED ORDER — ASPIRIN 300 MG RE SUPP
300.0000 mg | RECTAL | Status: AC
  Filled 2011-05-21: qty 1

## 2011-05-21 MED ORDER — PROMETHAZINE HCL 25 MG PO TABS: 25.0000 mg | ORAL_TABLET | Freq: Three times a day (TID) | ORAL | Status: DC | PRN

## 2011-05-21 MED ORDER — HYDROCODONE-ACETAMINOPHEN 5-325 MG PO TABS: 1.0000 | ORAL_TABLET | Freq: Four times a day (QID) | ORAL | Status: DC | PRN

## 2011-05-21 MED ORDER — PHENYTOIN SODIUM EXTENDED 100 MG PO CAPS
300.0000 mg | ORAL_CAPSULE | Freq: Every day | ORAL | Status: DC
  Administered 2011-05-21 – 2011-05-23 (×3): 300 mg via ORAL
  Filled 2011-05-21 (×4): qty 3

## 2011-05-21 MED ORDER — ASPIRIN 81 MG PO CHEW: 324.0000 mg | CHEWABLE_TABLET | ORAL | Status: AC

## 2011-05-21 MED ORDER — ALPRAZOLAM 0.5 MG PO TABS: 0.5000 mg | ORAL_TABLET | Freq: Three times a day (TID) | ORAL | Status: DC | PRN

## 2011-05-21 MED ORDER — CARVEDILOL 25 MG PO TABS
25.0000 mg | ORAL_TABLET | Freq: Two times a day (BID) | ORAL | Status: DC
  Administered 2011-05-22 – 2011-05-24 (×5): 25 mg via ORAL
  Filled 2011-05-21 (×7): qty 1

## 2011-05-21 MED ORDER — SODIUM CHLORIDE 0.9 % IV SOLN
INTRAVENOUS | Status: AC | PRN
  Administered 2011-05-21: 75 mL/h via INTRAVENOUS

## 2011-05-21 MED ORDER — SODIUM CHLORIDE 0.9 % IV BOLUS (SEPSIS)
500.0000 mL | Freq: Once | INTRAVENOUS | Status: AC
  Administered 2011-05-21: 500 mL via INTRAVENOUS

## 2011-05-21 MED ORDER — LEVOTHYROXINE SODIUM 25 MCG PO TABS
25.0000 ug | ORAL_TABLET | Freq: Every day | ORAL | Status: DC
  Administered 2011-05-21 – 2011-05-23 (×3): 25 ug via ORAL
  Filled 2011-05-21 (×3): qty 1

## 2011-05-21 MED ORDER — HEPARIN BOLUS VIA INFUSION
5000.0000 [IU] | Freq: Once | INTRAVENOUS | Status: AC
  Administered 2011-05-21: 5000 [IU] via INTRAVENOUS
  Filled 2011-05-21: qty 5000

## 2011-05-21 MED ORDER — IMIPRAMINE HCL 50 MG PO TABS
50.0000 mg | ORAL_TABLET | Freq: Every day | ORAL | Status: DC
  Administered 2011-05-21 – 2011-05-23 (×3): 50 mg via ORAL
  Filled 2011-05-21 (×5): qty 1

## 2011-05-21 MED ORDER — ALLOPURINOL 300 MG PO TABS
300.0000 mg | ORAL_TABLET | Freq: Every day | ORAL | Status: DC
  Administered 2011-05-21 – 2011-05-22 (×2): 300 mg via ORAL
  Filled 2011-05-21 (×2): qty 1

## 2011-05-21 MED ORDER — NITROGLYCERIN 0.4 MG SL SUBL: 0.4000 mg | SUBLINGUAL_TABLET | SUBLINGUAL | Status: DC | PRN

## 2011-05-21 NOTE — ED Notes (Signed)
Pt states he is having "pressure" but not pain. When ask to show where the pressure is he points to epigastric area and to midline base of sternum. Pt states he has felt like this since yesterday 05/20/11. MD in room now evaluating pt.

## 2011-05-21 NOTE — ED Provider Notes (Addendum)
History     CSN: [id number]  Arrival date & time 05/21/11  4098   First MD Initiated Contact with Patient 05/21/11 1043      Chief Complaint  Patient presents with  . Abdominal Pain    (Consider location/radiation/quality/duration/timing/severity/associated sxs/prior treatment) HPI Comments: Donald Owen is a 78 y.o. Male with a h/o CAD s/p CABG, CHF (EF 45%), diabetes, hypothyroidism,  brought in by ambulance, who presents to the Emergency Department complaining of generalized weakness. Patient states that for the last 3-4 days he has had no energy, feels a little nauseated, has had a poor appetite, and has had intermittent pain to the upper epigastric area that is a fullness.He denies fever, chills, chest pain. He states that he has been more short of breath with minimal exertion than usual. His daughter, with whom he lives, states he has had a poor appetite and has not wanted to participate in any activities for 2 days.    PCP Dr. Juanetta Gosling Cardiology: Dr. Dietrich Pates   Past Medical History  Diagnosis Date  . Thyroid disease   . Diabetes mellitus   . Myocardial infarction     x3  . Gout     Past Surgical History  Procedure Date  . Coronary artery bypass graft   . Carotid endarterectomy   . Cholecystectomy     No family history on file.  History  Substance Use Topics  . Smoking status: Former Games developer  . Smokeless tobacco: Not on file  . Alcohol Use: No      Review of Systems 10 Systems reviewed and are negative for acute change except as noted in the HPI. Allergies  Review of patient's allergies indicates no known allergies.  Home Medications  No current outpatient prescriptions on file.  BP 83/48  Pulse 162  Temp(Src) 97.9 F (36.6 C) (Oral)  Resp 20  Ht 6' (1.829 m)  Wt 247 lb (112.038 kg)  BMI 33.50 kg/m2  SpO2 94%  Physical Exam  Nursing note and vitals reviewed. Constitutional: He is oriented to person, place, and time. He appears well-developed  and well-nourished. No distress.  HENT:  Head: Normocephalic and atraumatic.  Eyes: Conjunctivae and EOM are normal.  Neck: Normal range of motion. Neck supple.  Cardiovascular:       tachycardia  Pulmonary/Chest: Effort normal. No respiratory distress. He has no wheezes. He exhibits no tenderness.       Rales at both bases, no wheezing.  Abdominal: Soft. Bowel sounds are normal.  Musculoskeletal: Normal range of motion. He exhibits edema.       2+ edema, R greater than L  Neurological: He is alert and oriented to person, place, and time. He has normal reflexes.  Skin: Skin is warm and dry.    ED Course  Procedures (including critical care time)  Results for orders placed during the hospital encounter of 05/21/11  BASIC METABOLIC PANEL      Component Value Range   Sodium 134 (*) 135 - 145 (mEq/L)   Potassium 4.9  3.5 - 5.1 (mEq/L)   Chloride 99  96 - 112 (mEq/L)   CO2 25  19 - 32 (mEq/L)   Glucose, Bld 380 (*) 70 - 99 (mg/dL)   BUN 45 (*) 6 - 23 (mg/dL)   Creatinine, Ser 1.19 (*) 0.50 - 1.35 (mg/dL)   Calcium 14.7 (*) 8.4 - 10.5 (mg/dL)   GFR calc non Af Amer 29 (*) >90 (mL/min)   GFR calc Af Amer 34 (*) >  90 (mL/min)  CBC      Component Value Range   WBC 8.2  4.0 - 10.5 (K/uL)   RBC 4.57  4.22 - 5.81 (MIL/uL)   Hemoglobin 13.7  13.0 - 17.0 (g/dL)   HCT 16.1  09.6 - 04.5 (%)   MCV 90.6  78.0 - 100.0 (fL)   MCH 30.0  26.0 - 34.0 (pg)   MCHC 33.1  30.0 - 36.0 (g/dL)   RDW 40.9  81.1 - 91.4 (%)   Platelets 157  150 - 400 (K/uL)  DIFFERENTIAL      Component Value Range   Neutrophils Relative 72  43 - 77 (%)   Neutro Abs 5.9  1.7 - 7.7 (K/uL)   Lymphocytes Relative 14  12 - 46 (%)   Lymphs Abs 1.2  0.7 - 4.0 (K/uL)   Monocytes Relative 13 (*) 3 - 12 (%)   Monocytes Absolute 1.1 (*) 0.1 - 1.0 (K/uL)   Eosinophils Relative 1  0 - 5 (%)   Eosinophils Absolute 0.1  0.0 - 0.7 (K/uL)   Basophils Relative 0  0 - 1 (%)   Basophils Absolute 0.0  0.0 - 0.1 (K/uL)  POCT I-STAT  TROPONIN I      Component Value Range   Troponin i, poc 0.13 (*) 0.00 - 0.08 (ng/mL)   Comment NOTIFIED PHYSICIAN     Comment 3            Dg Chest Portable 1 View  05/21/2011  *RADIOLOGY REPORT*  Clinical Data: Chest pain, history hypertension, prior open heart surgery  PORTABLE CHEST - 1 VIEW  Comparison: Portable exam 1006 hours compared to 08/11/2010  Findings: Enlargement of cardiac silhouette post CABG. Pulmonary vascular congestion. Atherosclerotic calcification aorta. Mild interstitial infiltrates in the mid-to-lower lungs question pulmonary edema. No pleural effusion or pneumothorax. No acute osseous findings.  IMPRESSION: Question mild CHF.  Original Report Authenticated By: Lollie Marrow, M.D.    Date: 05/21/2011  943  Rate: 165  Rhythm: ventricular tachycardia  QRS Axis: left  Intervals: normal  ST/T Wave abnormalities: nonspecific ST/T changes  Conduction Disutrbances:right bundle branch block and nonspecific intraventricular conduction delay  Narrative Interpretation:   Old EKG Reviewed: changes noted c/w 08/10/10 now with wide complex tachycardia   1020-Adenosine 6 mg given with no response. 1024-Adenosine 12 mg given with no response. BP remains in 80's and 90's. Patient in no distress. 1026-Diltiazen 25 mg given with no response. Continues to have BP 80's. Remains alert and in  No acute distress . 1115-Amiodarone 150 mg given with no response. 1229 Dr. Dietrich Pates , cardiology consulted and in the department to see the patient. Agrees to cardioversion. 1305-Advised by Dr. Dietrich Pates that cardioversion has resulted on NSR, rate 67. Patient remains sedated from Versed. I will call an make arrangements for transfer to [facility]. Cardioversion with 4 mg Versed and 75 joules.    #2 pre cardioversion  Date: 05/21/2011  1240  Rate: 157  Rhythm: ventricular tachycardia  QRS Axis: left  Intervals: normal  ST/T Wave abnormalities: nonspecific ST/T changes  Conduction  Disutrbances:none  Narrative Interpretation: wide complex tachycardia  Old EKG Reviewed: unchanged  #3 post cardioversion  Date: 05/21/2011 1308  Rate: 67  Rhythm: normal sinus rhythm and 1st degree AV block  QRS Axis: left  Intervals: normal  ST/T Wave abnormalities: nonspecific ST/T changes  Conduction Disutrbances:right bundle branch block and left posterior fascicular block  Narrative Interpretation: rhythm is similar to previous EKGs with bifascicular block  Old EKG Reviewed: changes noted converted to NSR  1326-Patient is waking up. Able to answer questions. VSS 1337-Advised by [facility] there are no step down beds at [facility] currently. Patient will be transferred to [facility] pending bed. Spoke with Dr. Anitra Lauth, EDP.    MDM  Patient with h/o CAD, CABG here with tachycardia  Most likely present for several days per patient history.that did not respond to Adenocard, Cardizem or amiodarone. Labs with hyperglycemia, renal insufficiency and elevated troponin 0.13. Consultation with cardiology, Dr. Dietrich Pates. Cardioversion to NSR. Arranging for transport to [facility]. Spoke with Dr. Jens Som, cardiology.He has accepted the patient in transfer to [facility].  CRITICAL CARE Performed by: Annamarie Dawley.   Total critical care time: 65 Critical care time was exclusive of separately billable procedures and treating other patients.  Critical care was necessary to treat or prevent imminent or life-threatening deterioration.  Critical care was time spent personally by me on the following activities: development of treatment plan with patient and/or surrogate as well as nursing, discussions with consultants, evaluation of patient's response to treatment, examination of patient, obtaining history from patient or surrogate, ordering and performing treatments and interventions, ordering and review of laboratory studies, ordering and review of radiographic studies, pulse oximetry and re-evaluation of patient's  condition.        Nicoletta Dress. Colon Branch, MD 05/21/11 1328  Nicoletta Dress. Colon Branch, MD 05/21/11 1338

## 2011-05-21 NOTE — ED Notes (Addendum)
Pt being transferred to [facility] via [facility]

## 2011-05-21 NOTE — ED Notes (Signed)
Pt c/o upper abd pain x 3 days.  Reports nausea, denies vomiting or diarrhea.  Pt reports generalized weakness.  Denies SOB.

## 2011-05-21 NOTE — ED Provider Notes (Signed)
Patient transferred from [facility]. He had refractory wide complex tachycardia and was cardioverted back to sinus rhythm. Presented with generalized weakness and decreased appetite for the past 3-4 days. Currently he is awake and alert in no distress. He denies any chest pain, shortness of breath or abdominal pain.  BP 100/51  Pulse 66  Temp(Src) 97.9 F (36.6 C) (Oral)  Resp 18  Ht 6' (1.829 m)  Wt 247 lb (112.038 kg)  BMI 33.50 kg/m2  SpO2 98% RRR, Bibasilar rales, +2 pitting edema in the lower extremities right > left   Date: 05/21/2011  Rate: 74  Rhythm: normal sinus rhythm  QRS Axis: right  Intervals: QT prolonged  ST/T Wave abnormalities: nonspecific ST/T changes  Conduction Disutrbances:right bundle branch block  Narrative Interpretation:   Old EKG Reviewed: changes noted  Patient accepted in transfer by cardiology Dr. Jens Som but there are no stepdown beds available. D/w Dr. Jens Som and Rosann Auerbach from [place] who will come evaluate patient and write orders.  Glynn Octave, MD 05/21/11 [phone]

## 2011-05-21 NOTE — ED Notes (Signed)
[id number] Ready 

## 2011-05-21 NOTE — ED Notes (Signed)
Message left for Donald Owen in ECHO.

## 2011-05-21 NOTE — ED Notes (Addendum)
Pt cardioverted with 75J by Dr.Rothbart.

## 2011-05-21 NOTE — ED Notes (Signed)
EKG obtained prior to cardioversion. Pt on cardiac monitor and 4L O2 [place] . Family waiting in waiting room during procedure.

## 2011-05-21 NOTE — Progress Notes (Signed)
Donald Owen  

## 2011-05-21 NOTE — ED Notes (Addendum)
Family in room at bedside. Pt denies any c/o pain or discomfort.

## 2011-05-21 NOTE — Progress Notes (Signed)
Patient seen at [facility] by Dr. Dietrich Pates. Per discussion with Dr. Jens Som, a full consult note is pending from Dr. Dietrich Pates. Patient was admitted with [facility] and required shock x 1 in ER with successful restoration of NSR following failure of medication to cardiovert (tried adenosine, amiodarone, & diltiazem). At this time, the rhythm is felt to be VT. Currently VSS & in NSR. Dr. Dietrich Pates felt patient needed to be NPO with EP consultation tomorrow so this will be requested. Med changes per discussion with Dr. Jens Som (who saw patient in ER at [facility] upon arrival) -- will hold Lasix & Demadex with renal insufficiency. Will decrease Lantus to half dose given that he'll be NPO. Decrease Flomax to 0.4mg  daily instead of bid which is typical dosing. Will d/c Norvasc and Hydralazine for now to allow for more blood pressure room. Add SSI. Will place on DVT ppx - can upgrade to heparin per pharmacy if enzymes trend upwards. Rosann Auerbach is aware that patient needs EP consult tomorrow.  Ricci Dirocco PA-C

## 2011-05-21 NOTE — ED Notes (Signed)
Adenosine 12mg  given IV per protocol and MD orders.

## 2011-05-21 NOTE — ED Notes (Addendum)
Adenosine 6mg  given IV per protocol and MD orders. Pt on bedside cardiac monitor.

## 2011-05-21 NOTE — Progress Notes (Signed)
First POC troponin was slightly elevated. 2nd full set also shows mildly elevated troponin of 0.39. With concern that VT may indicate underlying ischemia, will change heparin to per pharmacy.  Sendy Pluta PA-C

## 2011-05-21 NOTE — H&P (Signed)
Patient Name: Donald Owen  MRN: [id number]  HPI: Donald Owen is an 78 y.o. malereferred for consultation by Dr. Colon Branch for wide-complex tachycardia.  [name] was first seen by [facility] cardiology in 2000 when he presented with acute myocardial infarction, severe three-vessel coronary disease, congestive heart failure, moderate left ventricular dysfunction and mild renal insufficiency.  He underwent uncomplicated CABG surgery.  He returned in 2005 with chest discomfort, was found to have improved left ventricular systolic function with inferolateral hypokinesis and an EF of 50% and a stress nuclear study showing inferior infarction and no ischemia.  Had 2 subsequent admissions in early 2012 with chest discomfort and CHF, but refused cardiac catheterization.  Past Medical History  Diagnosis Date  . Arteriosclerotic cardiovascular disease (ASCVD) 2000    CABG surgery in 2000 following acute MI; moderate LV dysfunction; EF of 45-50% by echocardiogram in 2005; 05/2010-admitted with CHF and hypertension, EF of 45% by echo, patient refused cardiac catheterization  . Gout   . Hypothyroidism   . Seizures   . Dysrhythmia     wide complex tachycardia  . Diabetes mellitus, type 2     insulin dependent  . Cerebrovascular disease 2004    Left cerebral CVA followed by carotid endarterectomy in [place]  . Hypertension   . Gastritis 2004    with gastric erosions  . Hyperlipidemia     Past Surgical History  Procedure Date  . Coronary artery bypass graft 04/1999    Dr. Barry Dienes  . Carotid endarterectomy   . Cholecystectomy     Family History  Problem Relation Age of Onset  . Heart disease Father     Social History:  reports that he quit smoking about 12 years ago. He has never used smokeless tobacco. He reports that he does not drink alcohol or use illicit drugs.  Allergies: No Known Allergies  Medications:  I have reviewed the patient's current medications. Prior to Admission:    Prescriptions prior to admission  Medication Sig Dispense Refill  . allopurinol (ZYLOPRIM) 300 MG tablet Take 300 mg by mouth daily.      Marland Kitchen ALPRAZolam (XANAX) 0.5 MG tablet Take 0.5 mg by mouth 3 (three) times daily as needed. For anxiety      . amLODipine (NORVASC) 5 MG tablet Take 5 mg by mouth daily.      Marland Kitchen atorvastatin (LIPITOR) 80 MG tablet Take 40 mg by mouth 2 (two) times daily.      . carvedilol (COREG) 25 MG tablet Take 25 mg by mouth 2 (two) times daily with a meal.      . dipyridamole-aspirin (AGGRENOX) 25-200 MG per 12 hr capsule Take 1 capsule by mouth 2 (two) times daily.      . fenofibrate 160 MG tablet Take 160 mg by mouth daily.      . furosemide (LASIX) 40 MG tablet Take 40 mg by mouth daily.      Marland Kitchen glimepiride (AMARYL) 4 MG tablet Take 4 mg by mouth daily before breakfast.      . hydrALAZINE (APRESOLINE) 50 MG tablet Take 50 mg by mouth 4 (four) times daily.      Marland Kitchen HYDROcodone-acetaminophen (VICODIN) 5-500 MG per tablet Take 1 tablet by mouth every 6 (six) hours as needed. For pain      . imipramine (TOFRANIL) 25 MG tablet Take 50 mg by mouth at bedtime.      . insulin glargine (LANTUS) 100 UNIT/ML injection Inject 25 Units into the skin at bedtime.      Marland Kitchen  levETIRAcetam (KEPPRA) 500 MG tablet Take 500 mg by mouth 2 (two) times daily.      Marland Kitchen levothyroxine (SYNTHROID, LEVOTHROID) 200 MCG tablet Take 200 mcg by mouth daily.      Marland Kitchen levothyroxine (SYNTHROID, LEVOTHROID) 25 MCG tablet Take 25 mcg by mouth daily.      . phenytoin (DILANTIN) 100 MG ER capsule Take 100-300 mg by mouth 2 (two) times daily. 100 mg in the morning and 300 mg at night      . potassium chloride (K-DUR) 10 MEQ tablet Take 20 mEq by mouth 3 (three) times daily.      . promethazine (PHENERGAN) 25 MG tablet Take 25 mg by mouth every 8 (eight) hours as needed. For nausea/vomiting      . Tamsulosin HCl (FLOMAX) 0.4 MG CAPS Take 0.4 mg by mouth 2 (two) times daily.      Marland Kitchen torsemide (DEMADEX) 100 MG tablet Take  50 mg by mouth daily.        Results for orders placed during the hospital encounter of 05/21/11 (from the past 48 hour(s))  BASIC METABOLIC PANEL     Status: Abnormal   Collection Time   05/21/11  9:58 AM      Component Value Range Comment   Sodium 134 (*) 135 - 145 (mEq/L)    Potassium 4.9  3.5 - 5.1 (mEq/L)    Chloride 99  96 - 112 (mEq/L)    CO2 25  19 - 32 (mEq/L)    Glucose, Bld 380 (*) 70 - 99 (mg/dL)    BUN 45 (*) 6 - 23 (mg/dL)    Creatinine, Ser 1.61 (*) 0.50 - 1.35 (mg/dL)    Calcium 09.6 (*) 8.4 - 10.5 (mg/dL)    GFR calc non Af Amer 29 (*) >90 (mL/min)    GFR calc Af Amer 34 (*) >90 (mL/min)   CBC     Status: Normal   Collection Time   05/21/11  9:58 AM      Component Value Range Comment   WBC 8.2  4.0 - 10.5 (K/uL)    RBC 4.57  4.22 - 5.81 (MIL/uL)    Hemoglobin 13.7  13.0 - 17.0 (g/dL)    HCT 04.5  40.9 - 81.1 (%)    MCV 90.6  78.0 - 100.0 (fL)    MCH 30.0  26.0 - 34.0 (pg)    MCHC 33.1  30.0 - 36.0 (g/dL)    RDW 91.4  78.2 - 95.6 (%)    Platelets 157  150 - 400 (K/uL)   DIFFERENTIAL     Status: Abnormal   Collection Time   05/21/11  9:58 AM      Component Value Range Comment   Neutrophils Relative 72  43 - 77 (%)    Neutro Abs 5.9  1.7 - 7.7 (K/uL)    Lymphocytes Relative 14  12 - 46 (%)    Lymphs Abs 1.2  0.7 - 4.0 (K/uL)    Monocytes Relative 13 (*) 3 - 12 (%)    Monocytes Absolute 1.1 (*) 0.1 - 1.0 (K/uL)    Eosinophils Relative 1  0 - 5 (%)    Eosinophils Absolute 0.1  0.0 - 0.7 (K/uL)    Basophils Relative 0  0 - 1 (%)    Basophils Absolute 0.0  0.0 - 0.1 (K/uL)   POCT I-STAT TROPONIN I     Status: Abnormal   Collection Time   05/21/11 10:06 AM  Component Value Range Comment   Troponin i, poc 0.13 (*) 0.00 - 0.08 (ng/mL)    Comment NOTIFIED PHYSICIAN      Comment 3            PRO B NATRIURETIC PEPTIDE     Status: Abnormal   Collection Time   05/21/11 12:07 PM      Component Value Range Comment   Pro B Natriuretic peptide (BNP) 15525.0 (*) 0  - 450 (pg/mL)   GLUCOSE, CAPILLARY     Status: Abnormal   Collection Time   05/21/11  2:01 PM      Component Value Range Comment   Glucose-Capillary 307 (*) 70 - 99 (mg/dL)   CBC     Status: Abnormal   Collection Time   05/21/11  4:08 PM      Component Value Range Comment   WBC 8.2  4.0 - 10.5 (K/uL)    RBC 4.18 (*) 4.22 - 5.81 (MIL/uL)    Hemoglobin 12.6 (*) 13.0 - 17.0 (g/dL)    HCT 40.9 (*) 81.1 - 52.0 (%)    MCV 90.7  78.0 - 100.0 (fL)    MCH 30.1  26.0 - 34.0 (pg)    MCHC 33.2  30.0 - 36.0 (g/dL)    RDW 91.4  78.2 - 95.6 (%)    Platelets 143 (*) 150 - 400 (K/uL)   DIFFERENTIAL     Status: Normal   Collection Time   05/21/11  4:08 PM      Component Value Range Comment   Neutrophils Relative 76  43 - 77 (%)    Neutro Abs 6.2  1.7 - 7.7 (K/uL)    Lymphocytes Relative 14  12 - 46 (%)    Lymphs Abs 1.2  0.7 - 4.0 (K/uL)    Monocytes Relative 9  3 - 12 (%)    Monocytes Absolute 0.7  0.1 - 1.0 (K/uL)    Eosinophils Relative 1  0 - 5 (%)    Eosinophils Absolute 0.1  0.0 - 0.7 (K/uL)    Basophils Relative 0  0 - 1 (%)    Basophils Absolute 0.0  0.0 - 0.1 (K/uL)   BASIC METABOLIC PANEL     Status: Abnormal   Collection Time   05/21/11  4:08 PM      Component Value Range Comment   Sodium 135  135 - 145 (mEq/L)    Potassium 4.8  3.5 - 5.1 (mEq/L)    Chloride 101  96 - 112 (mEq/L)    CO2 23  19 - 32 (mEq/L)    Glucose, Bld 328 (*) 70 - 99 (mg/dL)    BUN 49 (*) 6 - 23 (mg/dL)    Creatinine, Ser 2.13 (*) 0.50 - 1.35 (mg/dL)    Calcium 9.7  8.4 - 10.5 (mg/dL)    GFR calc non Af Amer 28 (*) >90 (mL/min)    GFR calc Af Amer 32 (*) >90 (mL/min)   CARDIAC PANEL(CRET KIN+CKTOT+MB+TROPI)     Status: Normal   Collection Time   05/21/11  4:09 PM      Component Value Range Comment   Total CK 53  7 - 232 (U/L)    CK, MB 3.0  0.3 - 4.0 (ng/mL)    Troponin I <0.30  <0.30 (ng/mL)    Relative Index RELATIVE INDEX IS INVALID  0.0 - 2.5    CARDIAC PANEL(CRET KIN+CKTOT+MB+TROPI)     Status:  Abnormal   Collection Time   05/21/11  7:26 PM      Component Value Range Comment   Total CK 54  7 - 232 (U/L)    CK, MB 3.1  0.3 - 4.0 (ng/mL)    Troponin I 0.39 (*) <0.30 (ng/mL)    Relative Index RELATIVE INDEX IS INVALID  0.0 - 2.5    PROTIME-INR     Status: Abnormal   Collection Time   05/21/11  7:45 PM      Component Value Range Comment   Prothrombin Time 15.5 (*) 11.6 - 15.2 (seconds)    INR 1.20  0.00 - 1.49    APTT     Status: Normal   Collection Time   05/21/11  7:45 PM      Component Value Range Comment   aPTT 34  24 - 37 (seconds)   CBC     Status: Abnormal   Collection Time   05/21/11  7:45 PM      Component Value Range Comment   WBC 7.6  4.0 - 10.5 (K/uL)    RBC 4.02 (*) 4.22 - 5.81 (MIL/uL)    Hemoglobin 12.1 (*) 13.0 - 17.0 (g/dL)    HCT 16.1 (*) 09.6 - 52.0 (%)    MCV 90.5  78.0 - 100.0 (fL)    MCH 30.1  26.0 - 34.0 (pg)    MCHC 33.2  30.0 - 36.0 (g/dL)    RDW 04.5  40.9 - 81.1 (%)    Platelets 143 (*) 150 - 400 (K/uL)   CREATININE, SERUM     Status: Abnormal   Collection Time   05/21/11  7:45 PM      Component Value Range Comment   Creatinine, Ser 2.13 (*) 0.50 - 1.35 (mg/dL)    GFR calc non Af Amer 29 (*) >90 (mL/min)    GFR calc Af Amer 33 (*) >90 (mL/min)   HEPATIC FUNCTION PANEL     Status: Abnormal   Collection Time   05/21/11  7:45 PM      Component Value Range Comment   Total Protein 6.3  6.0 - 8.3 (g/dL)    Albumin 2.9 (*) 3.5 - 5.2 (g/dL)    AST 27  0 - 37 (U/L)    ALT 34  0 - 53 (U/L)    Alkaline Phosphatase 27 (*) 39 - 117 (U/L)    Total Bilirubin 0.3  0.3 - 1.2 (mg/dL)    Bilirubin, Direct 0.1  0.0 - 0.3 (mg/dL)    Indirect Bilirubin 0.2 (*) 0.3 - 0.9 (mg/dL)     Dg Chest Portable 1 View  05/21/2011  *RADIOLOGY REPORT*  Pulmonary vascular congestion. Atherosclerotic calcification aorta. Mild interstitial infiltrates in the mid-to-lower lungs question pulmonary edema.  IMPRESSION: Question mild CHF.   Lollie Marrow, M.D.    Review of  Systems: General: no anorexia, weight gain or weight loss Cardiac: no chest pain, dyspnea, orthopnea, PND,  or syncope Respiratory: no cough, sputum production or hemoptysis GI: no nausea, abdominal pain, emesis, diarrhea or constipation Integument: no significant lesions Neurologic: No muscle weakness or paralysis; no speech disturbance; no headache All other systems reviewed and are negative.  Physical Exam: Blood pressure 124/59, pulse 71, temperature 97.6 F (36.4 C), temperature source Oral, resp. rate 20, height 6' (1.829 m), weight 113 kg (249 lb 1.9 oz), SpO2 98.00%. General-Well-developed; no acute distress HEENT-[place]/AT; PERRL; EOM intact; conjunctiva and lids nl Neck-No JVD; no carotid bruits Endocrine-No thyromegaly Lungs-Clear lung fields; resonant percussion; normal I-to-E ratio; median sternotomy scar  Cardiovascular- normal PMI; distant S1 and S2; rapid regular rhythm Abdomen-BS normal; soft and non-tender without masses or organomegaly Musculoskeletal-No deformities, cyanosis or clubbing Neurologic-Nl cranial nerves; symmetric strength and tone Skin- Warm, no significant lesions Extremities-Nl distal pulses; trace edema  Assessment/Plan:  Wide-complex tachycardia: Although apparent atrial activity is visualized in one lead that appears to represent atrial flutter with 21 AV conduction, the lack of response to adenosine, diltiazem and amiodarone plus the very wide QRS complex and the presence of a left bundle branch block pattern versus the patient's usual right bundle branch block pattern suggests that the arrhythmia is ventricular tachycardia.  Following sedation with 4 mg of intravenous midazolam, a 75 J synchronized defibrillator discharge was delivered via AP pads with conversion to sinus rhythm and resumption of the patient's usual right bundle branch block.  ASCVD:  No symptoms to suggest recurrent myocardial ischemia.  He describes slight substernal discomfort while  in wide-complex tachycardia rate of 160.  She will be evaluated by our electrophysiology service for management of his arrhythmia.  He will require an echocardiogram to assess left ventricular systolic function.  A determination can then be made as to whether cardiac catheterization will be necessary.  Chronic kidney disease:  Creatinine is 2.1 on presentation and has varied between 1.8 and 2.4 in recent years.  Current value may be somewhat impaired by renal hypoperfusion related to his arrhythmia, but creatinine was 2.2 when last assessed in 08/2010. Renal function appears to be about at baseline.  Congestive heart failure:  LV function has been mildly to moderately impaired in the past, but clinical congestive heart failure was stiffly present as far back as 05/2010.  CHF may have been caused by arrhythmia with poor cardiac performance, relative hypotension and perhaps associated myocardial ischemia.  Repeat echocardiogram is pending.  Gentle diuresis would be appropriate for now.   Bing, MD 05/21/2011, 9:04 PM   Patient  stable following DC cardioversion and maintaining sinus rhythm.  Blood pressure has improved.  Oxygenation adequate.  Stable for transport via ambulance to [facility].  Case discussed with Dr. Jens Som, who will accept in transfer.

## 2011-05-21 NOTE — ED Notes (Signed)
Prior to arriva patient given 6mg  adenosine then 12mg  adenosine along with 150mg  amiodarone. Then cardioverted 75J x 1 heart rate 65-70 after Sinus with BBB wide QRS BNP 15500.

## 2011-05-21 NOTE — ED Notes (Signed)
Dr. Colon Branch aware of BP and HR. Family in room with pt. NAD

## 2011-05-21 NOTE — ED Notes (Signed)
Patient resting comfortably on stretcher watching tv with 2 family members.

## 2011-05-21 NOTE — Procedures (Signed)
Electrical Cardioversion Procedure Note Donald Owen [id number] Sep 14, 1933  Procedure: Electrical Cardioversion Indications:  Wide Complex Tachycardia  Procedure Details Consent: Risks of procedure as well as the alternatives and risks of each were explained to the (patient/caregiver).  Consent for procedure obtained. Time Out: Verified patient identification, verified procedure, site/side was marked, verified correct patient position, special equipment/implants available, medications/allergies/relevent history reviewed, required imaging and test results available.  Performed  Patient placed on cardiac monitor, pulse oximetry, supplemental oxygen as necessary.  Sedation given: Short-acting barbiturates Pacer pads placed anterior and posterior chest.  Cardioverted 1 time(s).  Cardioverted at 75J.  Evaluation Findings: Post procedure EKG shows: NSR Complications: None Patient did tolerate procedure well.   Donald Owen Bing 05/21/2011, 1:09 PM

## 2011-05-21 NOTE — ED Notes (Signed)
Patient arrived transfer form Donald Owen via [place].  Patient placed on ZOLL Pads and EKG obtained upon arrival.  Patient denies chest pain or abdominal pain at this time.  Airway intact bilateral equal chest rise and fall.  Patient Ax2 normal for patient.  States lives with daughter who is on her way.

## 2011-05-21 NOTE — ED Notes (Signed)
CRITICAL VALUE ALERT  Critical value received:  Trop POC  Date of notification:  05/21/11  Time of notification: 1013  Critical value read back:yes  Nurse who received alert:  A Shandiin Eisenbeis RN  MD notified (1st page):  Colon Branch  Time of first page: 1013  MD notified (2nd page):  Time of second page:  Responding MD:  Dr. Colon Branch  Time MD responded:  [phone]

## 2011-05-21 NOTE — ED Notes (Signed)
Dr. Colon Branch made aware of pt HR and BP. Pt alert and talking at this time.

## 2011-05-21 NOTE — Progress Notes (Signed)
CRITICAL VALUE ALERT  Critical value received:  Troponin 0.39  Date of notification:  05/21/11 Time of notification: 21:00 Critical value read back:yes  Nurse who received alert:  A. Darwin Rothlisberger  MD notified (1st page):  D. Dunn   Time of first page: 21:00  MD notified (2nd page):  Time of second page:  Responding MD: D. Dunn  Time MD responded:  21:00

## 2011-05-21 NOTE — ED Notes (Signed)
Dr. Dietrich Pates here for evaluation of pt.

## 2011-05-21 NOTE — Progress Notes (Signed)
ANTICOAGULATION CONSULT NOTE - Initial Consult  Pharmacy Consult for Heparin Indication: Arrythmia  No Known Allergies  Patient Measurements: Height: 6' (182.9 cm) Weight: 249 lb 1.9 oz (113 kg) IBW/kg (Calculated) : 77.6  Adjusted Body Weight: 101.8 kg  Vital Signs: Temp: 97.6 F (36.4 C) (01/14 2001) Temp src: Oral (01/14 2001) BP: 124/59 mmHg (01/14 2030) Pulse Rate: 71  (01/14 2030)  Labs:  Basename 05/21/11 1945 05/21/11 1926 05/21/11 1609 05/21/11 1608 05/21/11 0958  HGB 12.1* -- -- 12.6* --  HCT 36.4* -- -- 37.9* 41.4  PLT 143* -- -- 143* 157  APTT 34 -- -- -- --  LABPROT 15.5* -- -- -- --  INR 1.20 -- -- -- --  HEPARINUNFRC -- -- -- -- --  CREATININE 2.13* -- -- 2.19* 2.11*  CKTOTAL -- 54 53 -- --  CKMB -- 3.1 3.0 -- --  TROPONINI -- 0.39* <0.30 -- --   Estimated Creatinine Clearance: 38.9 ml/min (by C-G formula based on Cr of 2.13).  Medical History: Past Medical History  Diagnosis Date  . Thyroid disease   . Myocardial infarction     x3  . Gout   . Hypothyroidism   . Seizures   . Dysrhythmia     wide complex tachycardia  . Diabetes mellitus     insulin dependent    Medications:  Prescriptions prior to admission  Medication Sig Dispense Refill  . allopurinol (ZYLOPRIM) 300 MG tablet Take 300 mg by mouth daily.      Marland Kitchen ALPRAZolam (XANAX) 0.5 MG tablet Take 0.5 mg by mouth 3 (three) times daily as needed. For anxiety      . amLODipine (NORVASC) 5 MG tablet Take 5 mg by mouth daily.      Marland Kitchen atorvastatin (LIPITOR) 80 MG tablet Take 40 mg by mouth 2 (two) times daily.      . carvedilol (COREG) 25 MG tablet Take 25 mg by mouth 2 (two) times daily with a meal.      . dipyridamole-aspirin (AGGRENOX) 25-200 MG per 12 hr capsule Take 1 capsule by mouth 2 (two) times daily.      . fenofibrate 160 MG tablet Take 160 mg by mouth daily.      . furosemide (LASIX) 40 MG tablet Take 40 mg by mouth daily.      Marland Kitchen glimepiride (AMARYL) 4 MG tablet Take 4 mg by mouth  daily before breakfast.      . hydrALAZINE (APRESOLINE) 50 MG tablet Take 50 mg by mouth 4 (four) times daily.      Marland Kitchen HYDROcodone-acetaminophen (VICODIN) 5-500 MG per tablet Take 1 tablet by mouth every 6 (six) hours as needed. For pain      . imipramine (TOFRANIL) 25 MG tablet Take 50 mg by mouth at bedtime.      . insulin glargine (LANTUS) 100 UNIT/ML injection Inject 25 Units into the skin at bedtime.      . levETIRAcetam (KEPPRA) 500 MG tablet Take 500 mg by mouth 2 (two) times daily.      Marland Kitchen levothyroxine (SYNTHROID, LEVOTHROID) 200 MCG tablet Take 200 mcg by mouth daily.      Marland Kitchen levothyroxine (SYNTHROID, LEVOTHROID) 25 MCG tablet Take 25 mcg by mouth daily.      . phenytoin (DILANTIN) 100 MG ER capsule Take 100-300 mg by mouth 2 (two) times daily. 100 mg in the morning and 300 mg at night      . potassium chloride (K-DUR) 10 MEQ tablet Take 20 mEq  by mouth 3 (three) times daily.      . promethazine (PHENERGAN) 25 MG tablet Take 25 mg by mouth every 8 (eight) hours as needed. For nausea/vomiting      . Tamsulosin HCl (FLOMAX) 0.4 MG CAPS Take 0.4 mg by mouth 2 (two) times daily.      Marland Kitchen torsemide (DEMADEX) 100 MG tablet Take 50 mg by mouth daily.        Assessment: Transferred from [facility] with refratory WCT requiring shock x 1 in ER with successful restoration of NSR. Plan EP consult tomorrow.   Goal of Therapy:  Heparin level 0.3-0.7 units/ml   Plan:  Heparin 5000 unit IV bolus and infusion at 1550 units/hr.  Check heparin level in 6-8 hrs and daily.  Misty Stanley Stillinger 05/21/2011,9:01 PM

## 2011-05-21 NOTE — ED Notes (Signed)
Dr. Colon Branch made aware of BP and HR still maintaining in 160's. New order received.

## 2011-05-22 DIAGNOSIS — I059 Rheumatic mitral valve disease, unspecified: Secondary | ICD-10-CM

## 2011-05-22 LAB — CARDIAC PANEL(CRET KIN+CKTOT+MB+TROPI)
CK, MB: 3 ng/mL (ref 0.3–4.0)
CK, MB: 2.7 ng/mL (ref 0.3–4.0)
Relative Index: INVALID (ref 0.0–2.5)
Total CK: 57 U/L (ref 7–232)
Troponin I: 0.37 ng/mL (ref <0.30)

## 2011-05-22 LAB — GLUCOSE, CAPILLARY
Glucose-Capillary: 286 mg/dL — ABNORMAL HIGH (ref 70–99)
Glucose-Capillary: 227 mg/dL — ABNORMAL HIGH (ref 70–99)
Glucose-Capillary: 230 mg/dL — ABNORMAL HIGH (ref 70–99)
Glucose-Capillary: 207 mg/dL — ABNORMAL HIGH (ref 70–99)

## 2011-05-22 LAB — BASIC METABOLIC PANEL
CO2: 25 mEq/L (ref 19–32)
Calcium: 9.8 mg/dL (ref 8.4–10.5)
Creatinine, Ser: 2.06 mg/dL — ABNORMAL HIGH (ref 0.50–1.35)
GFR calc non Af Amer: 30 mL/min — ABNORMAL LOW (ref >90)

## 2011-05-22 LAB — CBC
HCT: 38.3 % — ABNORMAL LOW (ref 39.0–52.0)
Hemoglobin: 12.7 g/dL — ABNORMAL LOW (ref 13.0–17.0)
MCHC: 33.2 g/dL (ref 30.0–36.0)
Platelets: 140 10*3/uL — ABNORMAL LOW (ref 150–400)
RDW: 14.2 % (ref 11.5–15.5)
WBC: 8.4 10*3/uL (ref 4.0–10.5)

## 2011-05-22 LAB — HEPARIN LEVEL (UNFRACTIONATED): Heparin Unfractionated: 0.22 IU/mL — ABNORMAL LOW (ref 0.30–0.70)

## 2011-05-22 LAB — TSH: TSH: 1.969 u[IU]/mL (ref 0.350–4.500)

## 2011-05-22 MED ORDER — AMIODARONE HCL 200 MG PO TABS
200.0000 mg | ORAL_TABLET | Freq: Two times a day (BID) | ORAL | Status: DC
  Administered 2011-05-22 – 2011-05-24 (×4): 200 mg via ORAL
  Filled 2011-05-22 (×5): qty 1

## 2011-05-22 MED ORDER — ALLOPURINOL 100 MG PO TABS
100.0000 mg | ORAL_TABLET | Freq: Every day | ORAL | Status: DC
  Administered 2011-05-23 – 2011-05-24 (×2): 100 mg via ORAL
  Filled 2011-05-22 (×2): qty 1

## 2011-05-22 MED ORDER — REGADENOSON 0.4 MG/5ML IV SOLN
0.4000 mg | Freq: Once | INTRAVENOUS | Status: DC
  Filled 2011-05-22: qty 5

## 2011-05-22 MED ORDER — HEPARIN BOLUS VIA INFUSION
2000.0000 [IU] | Freq: Once | INTRAVENOUS | Status: AC
  Administered 2011-05-22: 2000 [IU] via INTRAVENOUS
  Filled 2011-05-22: qty 2000

## 2011-05-22 MED ORDER — AMIODARONE HCL 200 MG PO TABS
200.0000 mg | ORAL_TABLET | Freq: Two times a day (BID) | ORAL | Status: DC
  Administered 2011-05-22: 200 mg via ORAL
  Filled 2011-05-22 (×2): qty 1

## 2011-05-22 NOTE — Progress Notes (Signed)
*  PRELIMINARY RESULTS* Echocardiogram 2D Echocardiogram has been performed.  Clide Deutscher 05/22/2011, 4:18 PM

## 2011-05-22 NOTE — Progress Notes (Signed)
Inpatient Diabetes Program Recommendations  AACE/ADA: New Consensus Statement on Inpatient Glycemic Control (2009)  Target Ranges:  Prepandial:   less than 140 mg/dL      Peak postprandial:   less than 180 mg/dL (1-2 hours)      Critically ill patients:  140 - 180 mg/dL   Reason :  CBGs 725, 366, 286  Inpatient Diabetes Program Recommendations Insulin - Basal: Increase Lantus to home dose 25 units HgbA1C: check A1C to assess pre-hospital glucose control  Piedad Climes RN,BSN,CDE

## 2011-05-22 NOTE — Consult Note (Addendum)
ELECTROPHYSIOLOGY CONSULT NOTE    Patient ID: STEWARD SAMES MRN: [id number], DOB/AGE: 08-19-1933 78 y.o.  Admit date: 05/21/2011 Date of Consult: 05-22-2011  Primary Physician: Fredirick Maudlin, MD, MD Primary Cardiologist: [place] Bing, MD  Reason for Consultation: Wide complex tachycardia  HPI: Mr. Woodberry is a 78 year old male with a history of coronary artery disease, status post CABG in 2000 following an acute MI.  His EF has previously been 40-45%.  He has had subsequent episodes of chest pain and has been evaluated with nuclear testing but declined catheterization. Past medical history is also significant for hypothyroidism, seizures, diabetes, stroke, and hypertension. He is chronically ill.  His family states that he typically is not very active.  He reports walking in the yard from time to time but does not exert himself significantly. He was in his usual state of health until 3 days prior to admission when he began feeling weak.  He presented to [facility] ER yesterday and was found to be in wide complex tachycardia with a rate of 157.  He was hemodynamically stable with this.  He has not had prior presyncope or syncope.  Attempts to convert the patient medically with Adenosine, Diltiazem and Amiodarone without success.  The patient was ultimately externally cardioverted by Dr Dietrich Pates to sinus rhythm.  He was transferred to [facility] for further evaluation.  EP has been asked to evaluate for rhythm management.  Past Medical History  Diagnosis Date  . Arteriosclerotic cardiovascular disease (ASCVD) 2000    CABG surgery in 2000 following acute MI; moderate LV dysfunction; EF of 45-50% by echocardiogram in 2005; 05/2010-admitted with CHF and hypertension, EF of 45% by echo, patient refused cardiac catheterization  . Gout   . Hypothyroidism   . Seizures   . Dysrhythmia     wide complex tachycardia  . Diabetes mellitus, type 2     insulin dependent  . Cerebrovascular disease 2004      Left cerebral CVA followed by carotid endarterectomy in [place]  . Hypertension   . Gastritis 2004    with gastric erosions  . Hyperlipidemia      Surgical History:  Past Surgical History  Procedure Date  . Coronary artery bypass graft 04/1999    Dr. Barry Dienes  . Carotid endarterectomy   . Cholecystectomy      Prescriptions prior to admission  Medication Sig Dispense Refill  . allopurinol (ZYLOPRIM) 300 MG tablet Take 300 mg by mouth daily.      Marland Kitchen ALPRAZolam (XANAX) 0.5 MG tablet Take 0.5 mg by mouth 3 (three) times daily as needed. For anxiety      . amLODipine (NORVASC) 5 MG tablet Take 5 mg by mouth daily.      Marland Kitchen atorvastatin (LIPITOR) 80 MG tablet Take 40 mg by mouth 2 (two) times daily.      . carvedilol (COREG) 25 MG tablet Take 25 mg by mouth 2 (two) times daily with a meal.      . dipyridamole-aspirin (AGGRENOX) 25-200 MG per 12 hr capsule Take 1 capsule by mouth 2 (two) times daily.      . fenofibrate 160 MG tablet Take 160 mg by mouth daily.      . furosemide (LASIX) 40 MG tablet Take 40 mg by mouth daily.      Marland Kitchen glimepiride (AMARYL) 4 MG tablet Take 4 mg by mouth daily before breakfast.      . hydrALAZINE (APRESOLINE) 50 MG tablet Take 50 mg by mouth  4 (four) times daily.      Marland Kitchen HYDROcodone-acetaminophen (VICODIN) 5-500 MG per tablet Take 1 tablet by mouth every 6 (six) hours as needed. For pain      . imipramine (TOFRANIL) 25 MG tablet Take 50 mg by mouth at bedtime.      . insulin glargine (LANTUS) 100 UNIT/ML injection Inject 25 Units into the skin at bedtime.      . levETIRAcetam (KEPPRA) 500 MG tablet Take 500 mg by mouth 2 (two) times daily.      Marland Kitchen levothyroxine (SYNTHROID, LEVOTHROID) 200 MCG tablet Take 200 mcg by mouth daily.      Marland Kitchen levothyroxine (SYNTHROID, LEVOTHROID) 25 MCG tablet Take 25 mcg by mouth daily.      . phenytoin (DILANTIN) 100 MG ER capsule Take 100-300 mg by mouth 2 (two) times daily. 100 mg in the morning and 300 mg at night      . potassium  chloride (K-DUR) 10 MEQ tablet Take 20 mEq by mouth 3 (three) times daily.      . promethazine (PHENERGAN) 25 MG tablet Take 25 mg by mouth every 8 (eight) hours as needed. For nausea/vomiting      . Tamsulosin HCl (FLOMAX) 0.4 MG CAPS Take 0.4 mg by mouth 2 (two) times daily.      Marland Kitchen torsemide (DEMADEX) 100 MG tablet Take 50 mg by mouth daily.        Inpatient Medications:    . allopurinol  300 mg Oral Daily  . carvedilol  25 mg Oral BID WC  . dipyridamole-aspirin  1 capsule Oral BID  . fenofibrate  160 mg Oral Daily  . glimepiride  4 mg Oral QAC breakfast  . imipramine  50 mg Oral QHS  . insulin aspart  0-9 Units Subcutaneous TID WC  . insulin glargine  13 Units Subcutaneous QHS  . levETIRAcetam  500 mg Oral BID  . levothyroxine  200 mcg Oral Daily  . levothyroxine  25 mcg Oral Daily  . phenytoin  100 mg Oral Daily  . phenytoin  300 mg Oral QHS  . rosuvastatin  40 mg Oral Daily  . Tamsulosin HCl  0.4 mg Oral Daily    Allergies: No Known Allergies  History   Social History  . Marital Status: Widowed    Spouse Name: N/A    Number of Children: 5  . Years of Education: N/A   Occupational History  . Retired     Previously employed by [employer]   Social History Main Topics  . Smoking status: Former Smoker -- 1.5 packs/day for 40 years    Quit date: 04/08/1999  . Smokeless tobacco: Never Used   Comment: quit 1999  . Alcohol Use: No  . Drug Use: No  . Sexually Active: Not Currently   Other Topics Concern  . Not on file   Social History Narrative  . No narrative on file     Family History  Problem Relation Age of Onset  . Heart disease Father     Physical Exam: Filed Vitals:   05/22/11 0411 05/22/11 0500 05/22/11 0700 05/22/11 0755  BP: 134/65  147/69   Pulse: 75  88 90  Temp: 97.7 F (36.5 C)   99 F (37.2 C)  TempSrc: Oral   Oral  Resp: 18  21 21   Height:      Weight:  249 lb 1.9 oz (113 kg)    SpO2: 96%  93% 97%    GEN- The patient  is  elderly appearing, alert and oriented x 3 today.   Head- normocephalic, atraumatic Eyes-  Sclera clear, conjunctiva pink Ears- hearing intact Oropharynx- clear Neck- supple, no JVP Lymph- no cervical lymphadenopathy Lungs- Clear to ausculation bilaterally, normal work of breathing Heart- Regular rate and rhythm,   GI- soft, NT, ND, + BS Extremities- no clubbing, cyanosis, +1 edema MS- no significant deformity or atrophy Skin- no rash or lesion Psych- euthymic mood, full affect Neuro- strength and sensation are intact  Labs:   Lab Results  Component Value Date   WBC 8.4 05/22/2011   HGB 12.7* 05/22/2011   HCT 38.3* 05/22/2011   MCV 90.8 05/22/2011   PLT 140* 05/22/2011    Lab 05/22/11 0430 05/21/11 1945  NA 138 --  K 4.2 --  CL 102 --  CO2 25 --  BUN 48* --  CREATININE 2.06* --  CALCIUM 9.8 --  PROT -- 6.3  BILITOT -- 0.3  ALKPHOS -- 27*  ALT -- 34  AST -- 27  GLUCOSE 277* --   Lab Results  Component Value Date   CKTOTAL 57 05/22/2011   CKMB 2.7 05/22/2011   TROPONINI 0.31* 05/22/2011     Radiology/Studies: 05-21-2011  *RADIOLOGY REPORT*  Clinical Data: Chest pain, history hypertension, prior open heart surgery  PORTABLE CHEST - 1 VIEW  Comparison: Portable exam 1006 hours compared to 08/11/2010  Findings: Enlargement of cardiac silhouette post CABG. Pulmonary vascular congestion. Atherosclerotic calcification aorta. Mild interstitial infiltrates in the mid-to-lower lungs question pulmonary edema. No pleural effusion or pneumothorax. No acute osseous findings.  IMPRESSION: Question mild CHF.  Original Report Authenticated By: Lollie Marrow, M.D.   EKG: 05-21-2011 12:40- WCT rate 157, LBBB likely ventricular tachycardia 05-21-2011  13:08- sinus rhythm, first degree AV block, RBBB, rate 67 .25/.17/.49  TELEMETRY: sinus rhythm  Echo- pending   Assessment/ Plan: 1.  Ventricular tachycardia-  I have reviewed the patients EKG during tachycardia.  This is a LBB tachycardia  and likely represents VT.  I cannot exclude SVT with abbarancy.  He has chronic systolic dysfunction with prior EF 45%.  I had a long discussion with the patient about our options.  He would prefer medical therapy and would like to avoid any invasive procedures if able. At this time, I will follow-up on his echo.  If his EF is stable, then we will proceed with an adenosine myoview and medical management unless large ischemia is found.  If EF has declined, then I will discuss cath as an option with the patient.  As he has been hemodynamically stable with VT, I am inclined to treat this medically with amiodarone.  He would like to avoid ICD implant if possible.  2.  CRI- stable Avoid cath if able Decrease allopurinol to 100mg  daily.  3.  CAD/ Ischemic CM Stable As above   By echo today, EF is 45% with severe LVH.  Will proceed with lexiscan myoview.  If low risk, will treat VT medically.  Would avoid cath unless there is a very large area of ischemia noted.

## 2011-05-22 NOTE — Progress Notes (Signed)
ANTICOAGULATION CONSULT NOTE - Initial Consult  Pharmacy Consult for Heparin Indication: Tachycardia, r/o ACS  No Known Allergies  Patient Measurements: Height: 6' (182.9 cm) Weight: 249 lb 1.9 oz (113 kg) IBW/kg (Calculated) : 77.6  Adjusted Body Weight: 101.8 kg  Vital Signs: Temp: 97.7 F (36.5 C) (01/15 0411) Temp src: Oral (01/15 0411) BP: 134/65 mmHg (01/15 0411) Pulse Rate: 75  (01/15 0411)  Labs:  Basename 05/22/11 0430 05/21/11 2354 05/21/11 1945 05/21/11 1926 05/21/11 1609 05/21/11 1608 05/21/11 0958  HGB 12.7* -- 12.1* -- -- -- --  HCT 38.3* -- 36.4* -- -- 37.9* --  PLT 140* -- 143* -- -- 143* --  APTT -- -- 34 -- -- -- --  LABPROT -- -- 15.5* -- -- -- --  INR -- -- 1.20 -- -- -- --  HEPARINUNFRC 0.22* -- -- -- -- -- --  CREATININE -- -- 2.13* -- -- 2.19* 2.11*  CKTOTAL -- 54 -- 54 53 -- --  CKMB -- 3.0 -- 3.1 3.0 -- --  TROPONINI -- 0.37* -- 0.39* <0.30 -- --   Estimated Creatinine Clearance: 38.9 ml/min (by C-G formula based on Cr of 2.13).  Assessment: 78 yo male with poss ACS for Heparin   Goal of Therapy:  Heparin level 0.3-0.7 units/ml   Plan:  Heparin 2000 unit IV bolus then increase Heparin 1850 units/hr  Check heparin level in 6 hrs  Samyukta Cura, Gary Fleet 05/22/2011,5:46 AM

## 2011-05-23 ENCOUNTER — Other Ambulatory Visit (HOSPITAL_COMMUNITY): Payer: Medicare Other

## 2011-05-23 ENCOUNTER — Inpatient Hospital Stay (HOSPITAL_COMMUNITY): Payer: Medicare Other

## 2011-05-23 LAB — GLUCOSE, CAPILLARY
Glucose-Capillary: 173 mg/dL — ABNORMAL HIGH (ref 70–99)
Glucose-Capillary: 170 mg/dL — ABNORMAL HIGH (ref 70–99)
Glucose-Capillary: 173 mg/dL — ABNORMAL HIGH (ref 70–99)
Glucose-Capillary: 172 mg/dL — ABNORMAL HIGH (ref 70–99)

## 2011-05-23 LAB — CBC
Hemoglobin: 11.7 g/dL — ABNORMAL LOW (ref 13.0–17.0)
RBC: 3.86 MIL/uL — ABNORMAL LOW (ref 4.22–5.81)

## 2011-05-23 MED ORDER — LEVOTHYROXINE SODIUM 25 MCG PO TABS
225.0000 ug | ORAL_TABLET | Freq: Every day | ORAL | Status: DC
  Administered 2011-05-24: 225 ug via ORAL
  Filled 2011-05-23 (×2): qty 1

## 2011-05-23 MED ORDER — MUPIROCIN 2 % EX OINT
1.0000 "application " | TOPICAL_OINTMENT | Freq: Two times a day (BID) | CUTANEOUS | Status: DC
  Administered 2011-05-23 – 2011-05-24 (×3): 1 via NASAL
  Filled 2011-05-23 (×2): qty 22

## 2011-05-23 MED ORDER — CHLORHEXIDINE GLUCONATE CLOTH 2 % EX PADS
6.0000 | MEDICATED_PAD | Freq: Every day | CUTANEOUS | Status: DC
  Administered 2011-05-23 – 2011-05-24 (×2): 6 via TOPICAL

## 2011-05-23 MED ORDER — LOSARTAN POTASSIUM 25 MG PO TABS
25.0000 mg | ORAL_TABLET | Freq: Every day | ORAL | Status: DC
  Administered 2011-05-23 – 2011-05-24 (×2): 25 mg via ORAL
  Filled 2011-05-23 (×4): qty 1

## 2011-05-23 NOTE — Progress Notes (Signed)
Patient ID: Donald Owen, male   DOB: 12-03-1933, 78 y.o.   MRN: [id number] Subjective:  Denies chest pain or sob. Patient has refused his treatment.  Objective:  Vital Signs in the last 24 hours: Temp:  [97.7 F (36.5 C)-99.2 F (37.3 C)] 99.2 F (37.3 C) (01/16 1146) Pulse Rate:  [62-80] 80  (01/16 1146) Resp:  [18-21] 19  (01/16 1146) BP: (110-143)/(42-83) 141/64 mmHg (01/16 1146) SpO2:  [94 %-99 %] 99 % (01/16 1146) Weight:  [111.7 kg (246 lb 4.1 oz)] 111.7 kg (246 lb 4.1 oz) (01/16 0500)  Intake/Output from previous day: 01/15 0701 - 01/16 0700 In: 1134 [P.O.:1020; I.V.:114] Out: 1325 [Urine:1325] Intake/Output from this shift: Total I/O In: 240 [P.O.:240] Out: -   Physical Exam: Well appearing NAD HEENT: Unremarkable Neck:  No JVD, no thyromegally Lymphatics:  No adenopathy Back:  No CVA tenderness Lungs:  Clear with no wheezes. HEART:  Regular rate rhythm, no murmurs, no rubs, no clicks Abd:  Flat, positive bowel sounds, no organomegally, no rebound, no guarding Ext:  2 plus pulses, no edema, no cyanosis, no clubbing Skin:  No rashes no nodules Neuro:  CN II through XII intact, motor grossly intact  Lab Results:  Basename 05/23/11 0520 05/22/11 0430  WBC 8.4 8.4  HGB 11.7* 12.7*  PLT 144* 140*    Basename 05/22/11 0430 05/21/11 1945 05/21/11 1608  NA 138 -- 135  K 4.2 -- 4.8  CL 102 -- 101  CO2 25 -- 23  GLUCOSE 277* -- 328*  BUN 48* -- 49*  CREATININE 2.06* 2.13* --    Basename 05/22/11 0430 05/21/11 2354  TROPONINI 0.31* 0.37*   Hepatic Function Panel  Basename 05/21/11 1945  PROT 6.3  ALBUMIN 2.9*  AST 27  ALT 34  ALKPHOS 27*  BILITOT 0.3  BILIDIR 0.1  IBILI 0.2*   No results found for this basename: CHOL in the last 72 hours No results found for this basename: PROTIME in the last 72 hours  Imaging: No results found.  Tele - NSR.  A/P 1. VT - he needs catheterization which he refuses. He could consider stress testing but he  refuses. He is willing to try medical therapy. Will plan Beta blocker and ARB.  2. Probable CAD - he is unwilling to undergo evaluation. 3. Disposition - will plan discharge home in the morning.             LOS: 2 days    Lewayne Bunting 05/23/2011, 4:49 PM

## 2011-05-24 DIAGNOSIS — I472 Ventricular tachycardia: Secondary | ICD-10-CM

## 2011-05-24 LAB — CBC
Hemoglobin: 11.6 g/dL — ABNORMAL LOW (ref 13.0–17.0)
RBC: 3.88 MIL/uL — ABNORMAL LOW (ref 4.22–5.81)

## 2011-05-24 LAB — BASIC METABOLIC PANEL
Calcium: 9.8 mg/dL (ref 8.4–10.5)
GFR calc non Af Amer: 47 mL/min — ABNORMAL LOW (ref >90)
Sodium: 137 mEq/L (ref 135–145)

## 2011-05-24 LAB — GLUCOSE, CAPILLARY: Glucose-Capillary: 271 mg/dL — ABNORMAL HIGH (ref 70–99)

## 2011-05-24 MED ORDER — LOSARTAN POTASSIUM 25 MG PO TABS: 25.0000 mg | ORAL_TABLET | Freq: Every day | ORAL | Status: DC

## 2011-05-24 MED ORDER — NITROGLYCERIN 0.4 MG SL SUBL: 0.4000 mg | SUBLINGUAL_TABLET | SUBLINGUAL | Status: DC | PRN

## 2011-05-24 MED ORDER — POTASSIUM CHLORIDE ER 10 MEQ PO TBCR: 20.0000 meq | EXTENDED_RELEASE_TABLET | Freq: Every day | ORAL | Status: DC

## 2011-05-24 MED ORDER — FUROSEMIDE 40 MG PO TABS: 40.0000 mg | ORAL_TABLET | Freq: Every day | ORAL | Status: DC

## 2011-05-24 MED ORDER — AMIODARONE HCL 200 MG PO TABS: 200.0000 mg | ORAL_TABLET | Freq: Two times a day (BID) | ORAL | Status: DC

## 2011-05-24 MED ORDER — FUROSEMIDE 40 MG PO TABS: 40.0000 mg | ORAL_TABLET | Freq: Every day | ORAL | Status: DC | PRN

## 2011-05-24 NOTE — Progress Notes (Signed)
Patient ID: Donald Owen, male   DOB: February 25, 1934, 78 y.o.   MRN: [id number] Subjective:  Denies chest pain or sob or palpitations.  Objective:  Vital Signs in the last 24 hours: Temp:  [97.9 F (36.6 C)-99.2 F (37.3 C)] 98.7 F (37.1 C) (01/17 0500) Pulse Rate:  [66-80] 72  (01/17 0500) Resp:  [19-20] 20  (01/17 0500) BP: (113-141)/(63-67) 128/67 mmHg (01/17 0500) SpO2:  [91 %-99 %] 92 % (01/17 0500) Weight:  [112.2 kg (247 lb 5.7 oz)] 112.2 kg (247 lb 5.7 oz) (01/17 0500)  Intake/Output from previous day: 01/16 0701 - 01/17 0700 In: 243 [P.O.:240; I.V.:3] Out: -  Intake/Output from this shift: Total I/O In: 480 [P.O.:480] Out: -   Physical Exam: Well appearing NAD HEENT: Unremarkable Neck:  No JVD, no thyromegally Lymphatics:  No adenopathy Back:  No CVA tenderness Lungs:  Clear with no wheezes, rales, or rhonchi HEART:  Regular rate rhythm, no murmurs, no rubs, no clicks Abd:  Flat, positive bowel sounds, no organomegally, no rebound, no guarding Ext:  2 plus pulses, no edema, no cyanosis, no clubbing Skin:  No rashes no nodules Neuro:  CN II through XII intact, motor grossly intact  Lab Results:  Basename 05/24/11 0650 05/23/11 0520  WBC 6.6 8.4  HGB 11.6* 11.7*  PLT 150 144*    Basename 05/24/11 0650 05/22/11 0430  NA 137 138  K 4.5 4.2  CL 104 102  CO2 22 25  GLUCOSE 187* 277*  BUN 38* 48*  CREATININE 1.42* 2.06*    Basename 05/22/11 0430 05/21/11 2354  TROPONINI 0.31* 0.37*   Hepatic Function Panel  Basename 05/21/11 1945  PROT 6.3  ALBUMIN 2.9*  AST 27  ALT 34  ALKPHOS 27*  BILITOT 0.3  BILIDIR 0.1  IBILI 0.2*   No results found for this basename: CHOL in the last 72 hours No results found for this basename: PROTIME in the last 72 hours  Imaging: No results found.  Cardiac Studies: Tele - NSR Assessment/Plan:  1. VT - he has had no additional arrhythmias. He will continue amiodarone. I would like to see him back in my   clinic in 4 weeks. 2. Cardiomyopathy - he has refused catheterization and nuclear stress testing. Will treat medically with a beta blocker, ARB and diuretics (prn). A low sodium diet is recommended.  LOS: 3 days    Lewayne Bunting 05/24/2011, 10:13 AM

## 2011-05-24 NOTE — Discharge Summary (Signed)
Discharge Summary   Patient ID: Donald Owen MRN: [id number], DOB/AGE: 1933-11-17 78 y.o.  Primary MD: Fredirick Maudlin, MD,  Primary Cardiologist: Dr. Dietrich Pates  Admit date: 05/21/2011 D/C date:     05/24/2011     Primary Discharge Diagnoses:  1. Wide Complex Tachycardia, felt likely VT  - S/p Electrical Cardioversion 05/21/11 with return to NSR  - Declined evaluation by cath or myoview stress test, will treat medically  Secondary Discharge Diagnoses:  1. CAD - CABG surgery in 2000 following acute MI; moderate LV dysfunction; EF of 45-50% by echocardiogram in 2005; 05/2010-admitted with CHF and hypertension, EF of 45% by echo, patient refused cardiac catheterization  2. Systolic CHF - EF 45% by echo 0/86/57 3. Hypertension 4. Hyperlipidemia 5. Diabetes Mellitus Type 2 6. CKD (baseline 1.8-2.4) 7. CVA - Left cerebral CVA followed by carotid endarterectomy in [place] 2004 8. Hypothyroidism 9. Seizures 10. Gastritis with gastric erosions 11. Gout  Allergies No Known Allergies  Diagnostic Studies/Procedures:  05/21/11 - Electrical Cardioversion for WCT Cardioverted 1 time(s). Cardioverted at 75J.  Evaluation Findings: Post procedure EKG shows: NSR  05/22/11 - 2D Echocardiogram Study Conclusions - Left ventricle: Diffuse hypokinesis worse in the apex and inferobase The cavity size was moderately dilated. Wall thickness was increased in a pattern of severe LVH. The estimated ejection fraction was 45%. - Mitral valve: Mild regurgitation. - Left atrium: The atrium was mildly dilated. - Atrial septum: No defect or patent foramen ovale was identified. - Pulmonary arteries: PA peak pressure: 33mm Hg (S). - Pericardium, extracardiac: A trivial pericardial effusion was identified.  Dg Chest Portable 1 View  05/21/2011 Findings: Enlargement of cardiac silhouette post CABG. Pulmonary vascular congestion. Atherosclerotic calcification aorta. Mild interstitial infiltrates in the  mid-to-lower lungs question pulmonary edema. No pleural effusion or pneumothorax. No acute osseous findings.  IMPRESSION: Question mild CHF.    History of Present Illness: 78 y.o. male w/ PMHx significant for CAD s/p CABG, Systolic CHF (EF 84%), HTN, HLD, DMII, CKD, and CVA s/p CEA who presented to [facility] via EMS on 05/21/11 complaining of generalized weakness and found to have a wide complex tachycardia. He was given adenosine, diltiazem and amiodarone without conversion and he was thus successfully electrically cardioverted and subsequently transferred to [facility] for further evaluation and treatment.  He was first seen by [facility] cardiology in 2000 when he presented with acute myocardial infarction, severe three-vessel coronary disease, congestive heart failure, moderate left ventricular dysfunction and mild renal insufficiency. He underwent uncomplicated CABG surgery. He returned in 2005 with chest discomfort, was found to have improved left ventricular systolic function with inferolateral hypokinesis and an EF of 50% and a stress nuclear study showing inferior infarction and no ischemia. Has had 2 subsequent admissions in early 2012 with chest discomfort and CHF, but refused cardiac catheterization.  In the [facility] ED he stated that for 3-4 days prior to presentation he had no energy, felt a little nauseated, with a poor appetite, and had intermittent pain to the upper epigastric area described as a fullness. He denied fever, chills, and chest pain. He stated that he had been more short of breath with minimal exertion than usual. His daughter, with whom he lives, stated he had a poor appetite and has not wanted to participate in any activities for 2 days.   Hospital Course: In the [facility] ED his labs were significant for poc troponin 0.13, BUN/Crt 45/2.11, glucose 380. CXR showed mild interstitial infiltrates in  the mid-to-lower lungs with ? Mild CHF. EKG revealed wide  complex tachycardia 165bpm, RBBB, and nonspecific ST/T changes. He received Adenosine 6mg  followed by 12mg  with no response, then 25mg  diltiazem bolus followed by 150mg  amiodarone with no response. Dr. Dietrich Pates was consulted and was able to cardiovert him with 75joules into normal sinus rhythm with 1st degree AV block. He was subsequently transferred to [facility] for further evaluation and treatment.  At [facility] the patient was stable without complaints. His EKG revealed NSR. He was made NPO, his Lasix and Demadex were held for renal insufficiency, and his Norvasc and Hydralazine were held to allow for more BP room. His cardiac enzymes were cycled with peak troponin 0.39 and he was initiated on a heparin drip. He was evaluated by Dr. Johney Frame (EP) who felt his WCT likely represented VT. He had a long discussion with the patient regarding options for further work up and treatment. The patient decided he would prefer medical therapy and would like to avoid any invasive procedures. A  2D echocardiogram was obtained to evaluate LVSF with findings as noted above. A lexiscan myoview stress test was ordered, however, the patient declined undergoing this test and preferred trying medical therapy only. He didn't have any additional arrythmias during admission.  He was seen and examined by Dr. Ladona Ridgel and felt stable for discharge home with follow up as scheduled below. He was instructed not to drive.   Discharge Vitals: Blood pressure 128/67, pulse 72, temperature 98.7 F (37.1 C), temperature source Oral, resp. rate 20, height 6' (1.829 m), weight 247 lb 5.7 oz (112.2 kg), SpO2 92.00%.  Labs: Component Value Date   WBC 6.6 05/24/2011   HGB 11.6* 05/24/2011   HCT 35.4* 05/24/2011   MCV 91.2 05/24/2011   PLT 150 05/24/2011    Lab 05/24/11 0650 05/21/11 1945  NA 137 --  K 4.5 --  CL 104 --  CO2 22 --  BUN 38* --  CREATININE 1.42* --  CALCIUM 9.8 --  PROT -- 6.3  BILITOT -- 0.3  ALKPHOS -- 27*    ALT -- 34  AST -- 27  GLUCOSE 187* --   Basename 05/22/11 0430 05/21/11 2354 05/21/11 1926 05/21/11 1609  CKTOTAL 57 54 54 53  CKMB 2.7 3.0 3.1 3.0  TROPONINI 0.31* 0.37* 0.39* <0.30    05/21/2011 12:07  Pro B Natriuretic peptide (BNP) 15525.0 (H)     05/21/2011 19:45  TSH 1.969    Discharge Medications   Current Discharge Medication List    START taking these medications   Details  amiodarone (PACERONE) 200 MG tablet Take 1 tablet (200 mg total) by mouth 2 (two) times daily. Qty: 60 tablet, Refills: 6    losartan (COZAAR) 25 MG tablet Take 1 tablet (25 mg total) by mouth daily. Qty: 30 tablet, Refills: 6    nitroGLYCERIN (NITROSTAT) 0.4 MG SL tablet Place 1 tablet (0.4 mg total) under the tongue every 5 (five) minutes as needed for chest pain (Up to 3 doses). Qty: 25 tablet, Refills: 3      CONTINUE these medications which have CHANGED   Details  furosemide (LASIX) 40 MG tablet Take 1 tablet (40 mg total) by mouth daily. Qty: 30 tablet    potassium chloride (K-DUR) 10 MEQ tablet Take 2 tablets (20 mEq total) by mouth daily.      CONTINUE these medications which have NOT CHANGED   Details  allopurinol (ZYLOPRIM) 300 MG tablet Take 300 mg by mouth  daily.    ALPRAZolam (XANAX) 0.5 MG tablet Take 0.5 mg by mouth 3 (three) times daily as needed. For anxiety    amLODipine (NORVASC) 5 MG tablet Take 5 mg by mouth daily.    atorvastatin (LIPITOR) 80 MG tablet Take 40 mg by mouth 2 (two) times daily.    carvedilol (COREG) 25 MG tablet Take 25 mg by mouth 2 (two) times daily with a meal.    dipyridamole-aspirin (AGGRENOX) 25-200 MG per 12 hr capsule Take 1 capsule by mouth 2 (two) times daily.    fenofibrate 160 MG tablet Take 160 mg by mouth daily.    glimepiride (AMARYL) 4 MG tablet Take 4 mg by mouth daily before breakfast.    hydrALAZINE (APRESOLINE) 50 MG tablet Take 50 mg by mouth 4 (four) times daily.    HYDROcodone-acetaminophen (VICODIN) 5-500 MG per tablet  Take 1 tablet by mouth every 6 (six) hours as needed. For pain    imipramine (TOFRANIL) 25 MG tablet Take 50 mg by mouth at bedtime.    insulin glargine (LANTUS) 100 UNIT/ML injection Inject 25 Units into the skin at bedtime.    levETIRAcetam (KEPPRA) 500 MG tablet Take 500 mg by mouth 2 (two) times daily.    !! levothyroxine (SYNTHROID, LEVOTHROID) 200 MCG tablet Take 200 mcg by mouth daily.    !! levothyroxine (SYNTHROID, LEVOTHROID) 25 MCG tablet Take 25 mcg by mouth daily.    phenytoin (DILANTIN) 100 MG ER capsule Take 100-300 mg by mouth 2 (two) times daily. 100 mg in the morning and 300 mg at night    promethazine (PHENERGAN) 25 MG tablet Take 25 mg by mouth every 8 (eight) hours as needed. For nausea/vomiting    Tamsulosin HCl (FLOMAX) 0.4 MG CAPS Take 0.4 mg by mouth 2 (two) times daily.     !! - Potential duplicate medications found. Please discuss with provider.    STOP taking these medications     torsemide (DEMADEX) 100 MG tablet         Disposition   Discharge Orders    Future Appointments: Provider: Department: Dept Phone: Center:   06/28/2011 9:00 AM Lewayne Bunting, MD [facility] [phone] [id number]     Future Orders Please Complete By Expires   Diet - low sodium heart healthy      Increase activity slowly      Discharge instructions      Comments:   PLEASE DO NOT DRIVE     Follow-up Information    Follow up with HAWKINS,EDWARD L, MD. Schedule an appointment as soon as possible for a visit in 2 weeks.      Follow up with Lewayne Bunting, MD on 05/28/2011. (9:00)    Contact information:   [facility] [address] [place], [place] [address] [phone]       Follow up with [facility]. (Our office will call you with appointment time for lab work next week.)    Contact information:   [facility] [address] [place], [place] [address] [phone]          Outstanding Labs/Studies: Follow up BMET next  week  Duration of Discharge Encounter: Greater than 30 minutes including physician and PA time.  Signed, Bilal Manzer PA-C 05/24/2011, 1:05 PM

## 2011-06-27 ENCOUNTER — Encounter: Payer: Self-pay | Admitting: Internal Medicine

## 2011-06-28 ENCOUNTER — Encounter: Payer: Self-pay | Admitting: Internal Medicine

## 2011-06-28 ENCOUNTER — Ambulatory Visit (INDEPENDENT_AMBULATORY_CARE_PROVIDER_SITE_OTHER): Payer: Medicare Other | Admitting: Internal Medicine

## 2011-06-28 DIAGNOSIS — I472 Ventricular tachycardia: Secondary | ICD-10-CM

## 2011-06-28 DIAGNOSIS — I2510 Atherosclerotic heart disease of native coronary artery without angina pectoris: Secondary | ICD-10-CM | POA: Insufficient documentation

## 2011-06-28 DIAGNOSIS — I259 Chronic ischemic heart disease, unspecified: Secondary | ICD-10-CM

## 2011-06-28 DIAGNOSIS — I5022 Chronic systolic (congestive) heart failure: Secondary | ICD-10-CM | POA: Insufficient documentation

## 2011-06-28 NOTE — Patient Instructions (Signed)
Your physician wants you to follow-up in: 5 months with Dr. Taylor. You will receive a reminder letter in the mail two months in advance. If you don't receive a letter, please call our office to schedule the follow-up appointment.  Your physician recommends that you continue on your current medications as directed. Please refer to the Current Medication list given to you today.   

## 2011-06-28 NOTE — Assessment & Plan Note (Signed)
His symptoms remain class 2. He will continue his current meds and I have asked him to reduce his sodium intake.

## 2011-06-28 NOTE — Assessment & Plan Note (Signed)
He continues to refuse invasive eval and treatment with cath and ICD. He is willing to continue amiodarone for his VT. I will see him back in several months.

## 2011-06-28 NOTE — Progress Notes (Signed)
HPI Mr. Radell returns today for followup. He is a pleasant 78 yo man with an ICM, chronic systolic CHF, DM, and HTN who was admitted several months ago with sustained MMVT. The patient refused workup and was begun on medical therapy with amiodarone and returns today for followup. He denies chest pain or sob or peripheral edema or syncope. No palpitations. No Known Allergies   Current Outpatient Prescriptions  Medication Sig Dispense Refill  . allopurinol (ZYLOPRIM) 300 MG tablet Take 300 mg by mouth daily.      Marland Kitchen ALPRAZolam (XANAX) 0.5 MG tablet Take 0.5 mg by mouth 3 (three) times daily as needed. For anxiety      . amiodarone (PACERONE) 200 MG tablet Take 1 tablet (200 mg total) by mouth 2 (two) times daily.  60 tablet  6  . amLODipine (NORVASC) 5 MG tablet Take 5 mg by mouth daily.      Marland Kitchen atorvastatin (LIPITOR) 80 MG tablet Take 40 mg by mouth 2 (two) times daily.      . carvedilol (COREG) 25 MG tablet Take 25 mg by mouth 2 (two) times daily with a meal.      . dipyridamole-aspirin (AGGRENOX) 25-200 MG per 12 hr capsule Take 1 capsule by mouth 2 (two) times daily.      . fenofibrate 160 MG tablet Take 160 mg by mouth daily.      . furosemide (LASIX) 40 MG tablet Take 1 tablet (40 mg total) by mouth daily.  30 tablet    . glimepiride (AMARYL) 4 MG tablet Take 4 mg by mouth daily before breakfast.      . hydrALAZINE (APRESOLINE) 50 MG tablet Take 50 mg by mouth 4 (four) times daily.      Marland Kitchen HYDROcodone-acetaminophen (VICODIN) 5-500 MG per tablet Take 1 tablet by mouth every 6 (six) hours as needed. For pain      . imipramine (TOFRANIL) 25 MG tablet Take 50 mg by mouth at bedtime.      . insulin glargine (LANTUS) 100 UNIT/ML injection Inject 25 Units into the skin at bedtime.      . levETIRAcetam (KEPPRA) 500 MG tablet Take 500 mg by mouth 2 (two) times daily.      Marland Kitchen levothyroxine (SYNTHROID, LEVOTHROID) 200 MCG tablet Take 200 mcg by mouth daily.      Marland Kitchen levothyroxine (SYNTHROID, LEVOTHROID)  25 MCG tablet Take 25 mcg by mouth daily.      Marland Kitchen losartan (COZAAR) 25 MG tablet Take 1 tablet (25 mg total) by mouth daily.  30 tablet  6  . nitroGLYCERIN (NITROSTAT) 0.4 MG SL tablet Place 1 tablet (0.4 mg total) under the tongue every 5 (five) minutes as needed for chest pain (Up to 3 doses).  25 tablet  3  . phenytoin (DILANTIN) 100 MG ER capsule Take 100-300 mg by mouth 2 (two) times daily. 100 mg in the morning and 300 mg at night      . potassium chloride (K-DUR) 10 MEQ tablet Take 2 tablets (20 mEq total) by mouth daily.      . promethazine (PHENERGAN) 25 MG tablet Take 25 mg by mouth every 8 (eight) hours as needed. For nausea/vomiting      . Tamsulosin HCl (FLOMAX) 0.4 MG CAPS Take 0.4 mg by mouth 2 (two) times daily.         Past Medical History  Diagnosis Date  . Arteriosclerotic cardiovascular disease (ASCVD) 2000    CABG surgery in 2000 following acute MI; moderate  LV dysfunction; EF of 45-50% by echocardiogram in 2005; 05/2010-admitted with CHF and hypertension, EF of 45% by echo, patient refused cardiac catheterization  . Gout   . Hypothyroidism   . Seizures   . Dysrhythmia     wide complex tachycardia  . Diabetes mellitus, type 2     insulin dependent  . Cerebrovascular disease 2004    Left cerebral CVA followed by carotid endarterectomy in [place]  . Hypertension   . Gastritis 2004    with gastric erosions  . Hyperlipidemia     ROS:   All systems reviewed and negative except as noted in the HPI.   Past Surgical History  Procedure Date  . Coronary artery bypass graft 04/1999    Dr. Barry Dienes  . Carotid endarterectomy   . Cholecystectomy      Family History  Problem Relation Age of Onset  . Heart disease Father      History   Social History  . Marital Status: Widowed    Spouse Name: N/A    Number of Children: 5  . Years of Education: N/A   Occupational History  . Retired     Previously employed by [employer]   Social History Main Topics    . Smoking status: Former Smoker -- 1.5 packs/day for 40 years    Quit date: 04/08/1999  . Smokeless tobacco: Never Used   Comment: quit 1999  . Alcohol Use: No  . Drug Use: No  . Sexually Active: Not Currently   Other Topics Concern  . Not on file   Social History Narrative  . No narrative on file     BP 158/72  Pulse 71  Resp 18  Ht 5\' 11"  (1.803 m)  Wt 116.574 kg (257 lb)  BMI 35.84 kg/m2  Physical Exam:  Well appearing 78 yo man, NAD HEENT: Unremarkable Neck:  No JVD, no thyromegally Lungs:  Clear with no wheezes, rales, or rhonchi HEART:  Regular rate rhythm, no murmurs, no rubs, no clicks Abd:  soft, positive bowel sounds, no organomegally, no rebound, no guarding Ext:  2 plus pulses, no edema, no cyanosis, no clubbing Skin:  No rashes no nodules Neuro:  CN II through XII intact, motor grossly intact   Assess/Plan:

## 2011-06-28 NOTE — Assessment & Plan Note (Signed)
He denies anginal symptoms and is maximal medical therapy. He will continue. I have encouraged him to increase his physical activity.

## 2011-10-02 ENCOUNTER — Other Ambulatory Visit: Payer: Self-pay

## 2011-10-02 ENCOUNTER — Emergency Department (HOSPITAL_COMMUNITY): Payer: Medicare Other

## 2011-10-02 ENCOUNTER — Emergency Department (HOSPITAL_COMMUNITY)
Admission: EM | Admit: 2011-10-02 | Discharge: 2011-10-02 | Disposition: A | Discharge status: Home / Self Care | Payer: Medicare Other | Attending: Emergency Medicine | Admitting: Emergency Medicine

## 2011-10-02 ENCOUNTER — Encounter (HOSPITAL_COMMUNITY): Payer: Self-pay | Admitting: *Deleted

## 2011-10-02 DIAGNOSIS — R609 Edema, unspecified: Secondary | ICD-10-CM | POA: Insufficient documentation

## 2011-10-02 DIAGNOSIS — I509 Heart failure, unspecified: Secondary | ICD-10-CM | POA: Insufficient documentation

## 2011-10-02 DIAGNOSIS — Z86718 Personal history of other venous thrombosis and embolism: Secondary | ICD-10-CM | POA: Insufficient documentation

## 2011-10-02 DIAGNOSIS — Z794 Long term (current) use of insulin: Secondary | ICD-10-CM | POA: Insufficient documentation

## 2011-10-02 DIAGNOSIS — R0902 Hypoxemia: Secondary | ICD-10-CM | POA: Insufficient documentation

## 2011-10-02 DIAGNOSIS — R0789 Other chest pain: Secondary | ICD-10-CM

## 2011-10-02 DIAGNOSIS — R0602 Shortness of breath: Secondary | ICD-10-CM | POA: Insufficient documentation

## 2011-10-02 DIAGNOSIS — E119 Type 2 diabetes mellitus without complications: Secondary | ICD-10-CM | POA: Insufficient documentation

## 2011-10-02 DIAGNOSIS — Z87891 Personal history of nicotine dependence: Secondary | ICD-10-CM | POA: Insufficient documentation

## 2011-10-02 DIAGNOSIS — Z79899 Other long term (current) drug therapy: Secondary | ICD-10-CM | POA: Insufficient documentation

## 2011-10-02 DIAGNOSIS — I452 Bifascicular block: Secondary | ICD-10-CM | POA: Insufficient documentation

## 2011-10-02 DIAGNOSIS — R9431 Abnormal electrocardiogram [ECG] [EKG]: Secondary | ICD-10-CM | POA: Insufficient documentation

## 2011-10-02 DIAGNOSIS — E785 Hyperlipidemia, unspecified: Secondary | ICD-10-CM | POA: Insufficient documentation

## 2011-10-02 DIAGNOSIS — I2510 Atherosclerotic heart disease of native coronary artery without angina pectoris: Secondary | ICD-10-CM | POA: Insufficient documentation

## 2011-10-02 DIAGNOSIS — Z951 Presence of aortocoronary bypass graft: Secondary | ICD-10-CM | POA: Insufficient documentation

## 2011-10-02 DIAGNOSIS — I10 Essential (primary) hypertension: Secondary | ICD-10-CM | POA: Insufficient documentation

## 2011-10-02 DIAGNOSIS — E039 Hypothyroidism, unspecified: Secondary | ICD-10-CM | POA: Insufficient documentation

## 2011-10-02 LAB — CARDIAC PANEL(CRET KIN+CKTOT+MB+TROPI)
CK, MB: 1.9 ng/mL (ref 0.3–4.0)
CK, MB: 1.8 ng/mL (ref 0.3–4.0)
Relative Index: INVALID (ref 0.0–2.5)
Total CK: 53 U/L (ref 7–232)
Total CK: 50 U/L (ref 7–232)

## 2011-10-02 LAB — DIFFERENTIAL
Basophils Absolute: 0 10*3/uL (ref 0.0–0.1)
Lymphocytes Relative: 10 % — ABNORMAL LOW (ref 12–46)
Neutro Abs: 4.5 10*3/uL (ref 1.7–7.7)

## 2011-10-02 LAB — CBC
Platelets: 191 10*3/uL (ref 150–400)
RDW: 14.9 % (ref 11.5–15.5)
WBC: 5.8 10*3/uL (ref 4.0–10.5)

## 2011-10-02 LAB — POCT I-STAT TROPONIN I

## 2011-10-02 LAB — PROTIME-INR: Prothrombin Time: 14.2 seconds (ref 11.6–15.2)

## 2011-10-02 LAB — BASIC METABOLIC PANEL
CO2: 28 mEq/L (ref 19–32)
Calcium: 10.7 mg/dL — ABNORMAL HIGH (ref 8.4–10.5)
Chloride: 102 mEq/L (ref 96–112)
GFR calc Af Amer: 46 mL/min — ABNORMAL LOW (ref >90)
Sodium: 139 mEq/L (ref 135–145)

## 2011-10-02 LAB — PRO B NATRIURETIC PEPTIDE: Pro B Natriuretic peptide (BNP): 1840 pg/mL — ABNORMAL HIGH (ref 0–450)

## 2011-10-02 MED ORDER — ASPIRIN 81 MG PO CHEW: 324.0000 mg | CHEWABLE_TABLET | Freq: Once | ORAL | Status: DC

## 2011-10-02 MED ORDER — FUROSEMIDE 10 MG/ML IJ SOLN
40.0000 mg | Freq: Once | INTRAMUSCULAR | Status: AC
  Administered 2011-10-02: 40 mg via INTRAVENOUS
  Filled 2011-10-02: qty 4

## 2011-10-02 NOTE — ED Notes (Signed)
Pt returned from radiology. NAD at this time.

## 2011-10-02 NOTE — ED Notes (Signed)
Lt chest pain  , onset yesterday,No sob,No cough,

## 2011-10-02 NOTE — ED Provider Notes (Signed)
History  This chart was scribed for Donald Octave, MD by Bennett Scrape. This patient was seen in room [facility] and the patient's care was started at 12:32PM.  CSN: [id number]  Arrival date & time 10/02/11  1221   First MD Initiated Contact with Patient 10/02/11 1232      Chief Complaint  Patient presents with  . Chest Pain    The history is provided by the patient. No language interpreter was used.    Donald VANDERSCHAAF is a 78 y.o. male who presents to the Emergency Department complaining of 2 to 3 days of gradual onset, gradually worsening, intermittent left-sided chest pain episodes. Pt states he is currently having chest pain and states that this episode started last night and has been constant since. Pt reports similar episodes of chest pain, the most recent episode occuring 6 months ago. Pt states that the episodes usually come and go for about 6 days and then resolve on their own. He denies knowing the cause of the chest pain. He states that he was evaluated in the ED with negative blood work and radiology reports. Pt was recently put on amiodarone but states that he did not want stents or a pacemaker. Pt is unsure of when his last MI, stress test or catherdization was. He denies abdominal pain and emesis as associated symptoms. He also has a h/o gout, seizures, diabetes, HTN and CVA. He is a former smoker but denies alcohol use.  Dr. Juanetta Gosling is PCP. Pt is unsure what Cardiologist's name is.   Past Medical History  Diagnosis Date  . Arteriosclerotic cardiovascular disease (ASCVD) 2000    CABG surgery in 2000 following acute MI; moderate LV dysfunction; EF of 45-50% by echocardiogram in 2005; 05/2010-admitted with CHF and hypertension, EF of 45% by echo, patient refused cardiac catheterization  . Gout   . Hypothyroidism   . Seizures   . Dysrhythmia     wide complex tachycardia  . Diabetes mellitus, type 2     insulin dependent  . Cerebrovascular disease 2004    Left cerebral  CVA followed by carotid endarterectomy in [place]  . Hypertension   . Gastritis 2004    with gastric erosions  . Hyperlipidemia     Past Surgical History  Procedure Date  . Coronary artery bypass graft 04/1999    Dr. Barry Dienes  . Carotid endarterectomy   . Cholecystectomy   . Cardiac surgery     Family History  Problem Relation Age of Onset  . Heart disease Father     History  Substance Use Topics  . Smoking status: Former Smoker -- 1.5 packs/day for 40 years    Quit date: 04/08/1999  . Smokeless tobacco: Never Used   Comment: quit 1999  . Alcohol Use: No      Review of Systems  A complete 10 system review of systems was obtained and all systems are negative except as noted in the HPI and PMH.   Allergies  Review of patient's allergies indicates no known allergies.  Home Medications   Current Outpatient Rx  Name Route Sig Dispense Refill  . ALLOPURINOL 300 MG PO TABS Oral Take 300 mg by mouth daily.    Marland Kitchen ALPRAZOLAM 0.5 MG PO TABS Oral Take 0.5 mg by mouth 3 (three) times daily as needed. For anxiety    . AMIODARONE HCL 200 MG PO TABS Oral Take 1 tablet (200 mg total) by mouth 2 (two) times daily. 60 tablet 6  . AMLODIPINE BESYLATE  5 MG PO TABS Oral Take 5 mg by mouth daily.    . ATORVASTATIN CALCIUM 40 MG PO TABS Oral Take 40 mg by mouth daily.    . ATORVASTATIN CALCIUM 80 MG PO TABS Oral Take 40 mg by mouth 2 (two) times daily.    Marland Kitchen CARVEDILOL 25 MG PO TABS Oral Take 25 mg by mouth 2 (two) times daily with a meal.    . ASPIRIN-DIPYRIDAMOLE ER 25-200 MG PO CP12 Oral Take 1 capsule by mouth 2 (two) times daily.    . FENOFIBRATE 160 MG PO TABS Oral Take 160 mg by mouth daily.    . FUROSEMIDE 40 MG PO TABS Oral Take 40 mg by mouth daily. May take twice daily if needed    . GLIMEPIRIDE 4 MG PO TABS Oral Take 4 mg by mouth 2 (two) times daily with a meal.     . HYDRALAZINE HCL 50 MG PO TABS Oral Take 50 mg by mouth 4 (four) times daily.    Marland Kitchen HYDROCODONE-ACETAMINOPHEN  5-500 MG PO TABS Oral Take 1 tablet by mouth every 6 (six) hours as needed. For pain    . IMIPRAMINE HCL 25 MG PO TABS Oral Take 50 mg by mouth at bedtime.    . INSULIN GLARGINE 100 UNIT/ML  SOLN Subcutaneous Inject 25 Units into the skin at bedtime.    Marland Kitchen LEVETIRACETAM 500 MG PO TABS Oral Take 500 mg by mouth 2 (two) times daily.    Marland Kitchen LEVOTHYROXINE SODIUM 200 MCG PO TABS Oral Take 200 mcg by mouth daily.    Marland Kitchen LEVOTHYROXINE SODIUM 25 MCG PO TABS Oral Take 25 mcg by mouth daily.    Marland Kitchen LOSARTAN POTASSIUM 25 MG PO TABS Oral Take 1 tablet (25 mg total) by mouth daily. 30 tablet 6  . PHENYTOIN SODIUM EXTENDED 100 MG PO CAPS Oral Take 100-300 mg by mouth 2 (two) times daily. 100 mg in the morning and 300 mg at night    . POTASSIUM CHLORIDE ER 10 MEQ PO TBCR Oral Take 2 tablets (20 mEq total) by mouth daily.    Marland Kitchen PROMETHAZINE HCL 25 MG PO TABS Oral Take 25 mg by mouth every 8 (eight) hours as needed. For nausea/vomiting    . TAMSULOSIN HCL 0.4 MG PO CAPS Oral Take 0.4 mg by mouth 2 (two) times daily.    Marland Kitchen NITROGLYCERIN 0.4 MG SL SUBL Sublingual Place 1 tablet (0.4 mg total) under the tongue every 5 (five) minutes as needed for chest pain (Up to 3 doses). 25 tablet 3    Triage Vitals: Ht 5\' 11"  (1.803 m)  Wt 254 lb (115.214 kg)  BMI 35.43 kg/m2  Physical Exam  Nursing note and vitals reviewed. Constitutional: He is oriented to person, place, and time. He appears well-developed and well-nourished. No distress.  HENT:  Head: Normocephalic and atraumatic.  Eyes: Conjunctivae and EOM are normal.  Neck: Normal range of motion. Neck supple. No tracheal deviation present.  Cardiovascular: Normal rate and regular rhythm.   Pulmonary/Chest: Effort normal and breath sounds normal. No respiratory distress. He exhibits no tenderness (not reproducible to palpation).  Abdominal: Soft. He exhibits no distension.  Musculoskeletal: Normal range of motion. He exhibits edema.       +2 pedal edema bilaterally    Neurological: He is alert and oriented to person, place, and time.  Skin: Skin is warm and dry. No rash (on chest wall) noted.  Psychiatric: He has a normal mood and affect. His behavior is  normal.    ED Course  Procedures (including critical care time)  DIAGNOSTIC STUDIES: None were performed.  COORDINATION OF CARE: 12:43PM-Discussed treatment plan which includes chest x-ray and blood work with pt and pt agreed to plan.  Labs Reviewed  CBC - Abnormal; Notable for the following:    RBC 4.15 (*)    All other components within normal limits  DIFFERENTIAL - Abnormal; Notable for the following:    Neutrophils Relative 78 (*)    Lymphocytes Relative 10 (*)    Lymphs Abs 0.6 (*)    All other components within normal limits  BASIC METABOLIC PANEL - Abnormal; Notable for the following:    Glucose, Bld 185 (*)    BUN 24 (*)    Creatinine, Ser 1.62 (*)    Calcium 10.7 (*)    GFR calc non Af Amer 40 (*)    GFR calc Af Amer 46 (*)    All other components within normal limits  PRO B NATRIURETIC PEPTIDE - Abnormal; Notable for the following:    Pro B Natriuretic peptide (BNP) 1840.0 (*)    All other components within normal limits  CARDIAC PANEL(CRET KIN+CKTOT+MB+TROPI)  PROTIME-INR  POCT I-STAT TROPONIN I   Dg Chest 2 View  10/02/2011  *RADIOLOGY REPORT*  Clinical Data: Chest pain, shortness of breath.  CHEST - 2 VIEW  Comparison: 05/21/2011  Findings: Prior CABG.  Cardiomegaly.  Mild interstitial prominence, increased since prior study, possible mild interstitial edema.  No confluent opacities or effusions.  Stable peribronchial thickening and hyperinflation.  No acute bony abnormality.  IMPRESSION: Slight increased interstitial prominence, question interstitial edema.  Original Report Authenticated By: Cyndie Chime, M.D.     No diagnosis found.    MDM  2-3 days of left-sided chest pain that waxes and wanes in severity but never goes away completely. Patient states he had a  similar pain 6 months ago and did not think is coming from his heart. Patient has a history of coronary disease, ischemic cardiomyopathy, ventricular tachycardia, refused catheterization, stress test, AICD on last admission.  Atypical chest pain with mild volume overload on exam and labs. Patient given 40 mg of IV Lasix. I discussed this case with Dr. Ladona Ridgel his cardiologist. He agrees that chest pain is atypical for angina. Patient has a long history of noncompliance and has refused stress test, cardiac catheterization, AICD placement. Dr. Ladona Ridgel agrees with gentle diuresis and feels that patient can potentially go home or be admitted to the medical service.  Dr. Ladona Ridgel feels there is not much he can do for the patient as he repeatedly refuses any interventions.  Patient continues to have an oxygen requirement is 88% on room air without oxygen.  He is hypoxic to 85% with walking. He denies any further chest pain or shortness of breath. He refuses to be admitted for a CHF exacerbation and atypical chest pain. He is alert oriented and able to make his own decisions. He understands the risks of leaving including heart attack and death.  D/w Dr. Juanetta Gosling and advised him of patient's wishes to go home.  Dr. Juanetta Gosling will attempt to arrange home oxygen today and will expect to see patient in the office this week.   Date: 10/02/2011  Rate: 81  Rhythm: normal sinus rhythm  QRS Axis: right  Intervals: PR prolonged and QT prolonged  ST/T Wave abnormalities: nonspecific ST/T changes  Conduction Disutrbances:right bundle branch block and left posterior fascicular block  Narrative Interpretation:   Old EKG  Reviewed: unchanged  CRITICAL CARE Performed by: Donald Owen   Total critical care time: 30  Critical care time was exclusive of separately billable procedures and treating other patients.  Critical care was necessary to treat or prevent imminent or life-threatening deterioration.  Critical  care was time spent personally by me on the following activities: development of treatment plan with patient and/or surrogate as well as nursing, discussions with consultants, evaluation of patient's response to treatment, examination of patient, obtaining history from patient or surrogate, ordering and performing treatments and interventions, ordering and review of laboratory studies, ordering and review of radiographic studies, pulse oximetry and re-evaluation of patient's condition.     I personally performed the services described in this documentation, which was scribed in my presence.  The recorded information has been reviewed and considered.    Donald Octave, MD 10/02/11 [phone]

## 2011-10-02 NOTE — ED Notes (Signed)
Pt a/ox4. Resp even and labored off O2. NAD at this time. AMA paperwork reviewed with pt. Pt verbalized understanding. Pt ambulated to d/c desk with steady gate.

## 2011-10-02 NOTE — ED Notes (Signed)
Per NT pt ambulated well but pt O2 sat dropped to 85% on RA while walking. Pt returned to 95% after returning to room and O2 being placed back on.

## 2011-10-02 NOTE — ED Notes (Signed)
Pt to radiology via stretcher. Pt on o2@2L . NAD at this time.

## 2011-10-02 NOTE — Discharge Instructions (Signed)
Your leaving AGAINST MEDICAL ADVICE. Take your medications as prescribed and follow up with Dr. Juanetta Gosling this week. He will attempt to arrange home oxygen for you. Return to ED if you wish to be reevaluated or have any new or worsening symptoms. Heart Failure Heart failure (HF) is a condition in which the heart has trouble pumping blood. This means your heart does not pump blood efficiently for your body to work well. In some cases of HF, fluid may back up into your lungs or you may have swelling (edema) in your lower legs. HF is a long-term (chronic) condition. It is important for you to take good care of yourself and follow your caregiver's treatment plan. CAUSES   Health conditions:   High blood pressure (hypertension) causes the heart muscle to work harder than normal. When pressure in the blood vessels is high, the heart needs to pump (contract) with more force in order to circulate blood throughout the body. High blood pressure eventually causes the heart to become stiff and weak.   Coronary artery disease (CAD) is the buildup of cholesterol and fat (plaques) in the arteries of the heart. The blockage in the arteries deprives the heart muscle of oxygen and blood. This can cause chest pain and may lead to a heart attack. High blood pressure can also contribute to CAD.   Heart attack (myocardial infarction) occurs when 1 or more arteries in the heart become blocked. The loss of oxygen damages the muscle tissue of the heart. When this happens, part of the heart muscle dies. The injured tissue does not contract as well and weakens the heart's ability to pump blood.   Abnormal heart valves can cause HF when the heart valves do not open and close properly. This makes the heart muscle pump harder to keep the blood flowing.   Heart muscle disease (cardiomyopathy or myocarditis) is damage to the heart muscle from a variety of causes. These can include drug or alcohol abuse, infections, or unknown reasons.  These can increase the risk of HF.   Lung disease makes the heart work harder because the lungs do not work properly. This can cause a strain on the heart leading it to fail.   Diabetes increases the risk of HF. High blood sugar contributes to high fat (lipid) levels in the blood. Diabetes can also cause slow damage to tiny blood vessels that carry important nutrients to the heart muscle. When the heart does not get enough oxygen and food, it can cause the heart to become weak and stiff. This leads to a heart that does not contract efficiently.   Other diseases can contribute to HF. These include abnormal heart rhythms, thyroid problems, and low blood counts (anemia).   Unhealthy lifestyle habits:   Obesity.   Smoking.   Eating foods high in fat and cholesterol.   Eating or drinking beverages high in salt.   Drug or alcohol abuse.   Lack of exercise.  SYMPTOMS  HF symptoms may vary and can be hard to detect. Symptoms may include:  Shortness of breath with activity, such as climbing stairs.   Persistent cough.   Swelling of the feet, ankles, legs, or abdomen.   Unexplained weight gain.   Difficulty breathing when lying flat.   Waking from sleep because of the need to sit up and get more air.   Rapid heartbeat.   Fatigue and loss of energy.   Feeling lightheaded or close to fainting.  DIAGNOSIS  A diagnosis of HF  is based on your history, symptoms, physical examination, and diagnostic tests. Diagnostic tests for HF may include:  EKG.   Chest X-ray.   Blood tests.   Exercise stress test.   Blood oxygen test (arterial blood gas).   Evaluation by a heart doctor (cardiologist).   Ultrasound evaluation of the heart (echocardiogram).   Heart artery test to look for blockages (angiogram).   Radioactive imaging to look at the heart (radionuclide test).  TREATMENT  Treatment is aimed at managing the symptoms of HF. Medicines, lifestyle changes, or surgical  intervention may be necessary to treat HF.  Medicines to help treat HF may include:   Angiotensin-converting enzyme (ACE) inhibitors. These block the effects of a blood protein called angiotensin-converting enzyme. ACE inhibitors relax (dilate) the blood vessels and help lower blood pressure. This decreases the workload of the heart, slows the progression of HF, and improves symptoms.   Angiotensin receptor blockers (ARBs). These medications work similar to ACE inhibitors. ARBs may be an alternative for people who cannot tolerate an ACE inhibitor.   Aldosterone antagonists. This medication helps get rid of extra fluid from your body. This lowers the volume of blood the heart has to pump.   Water pills (diuretics). Diuretics cause the kidneys to remove salt and water from the blood. The extra fluid is removed by urination. By removing extra fluid from the body, diuretics help lower the workload of the heart and help prevent fluid buildup in the lungs so breathing is easier.   Beta blockers. These prevent the heart from beating too fast and improve heart muscle strength. Beta blockers help maintain a normal heart rate, control blood pressure, and improve HF symptoms.   Digitalis. This increases the force of the heartbeat and may be helpful to people with HF or heart rhythm problems.   Healthy lifestyle changes include:   Stopping smoking.   Eating a healthy diet. Avoid foods high in fat. Avoid foods fried in oil or made with fat. A dietician can help with healthy food choices.   Limiting how much salt you eat.   Limiting alcohol intake to no more than 1 drink per day for women and 2 drinks per day for men. Drinking more than that is harmful to your heart. If your heart has already been damaged by alcohol or you have severe HF, drinking alcohol should be stopped completely.   Exercising as directed by your caregiver.   Surgical treatment for HF may include:   Procedures to open blocked  arteries, repair damaged heart valves, or remove damaged heart muscle tissue.   A pacemaker to help heart muscle function and to control certain abnormal heart rhythms.   A defibrillator to possibly prevent sudden cardiac death.  HOME CARE INSTRUCTIONS   Activity level. Your caregiver can help you determine what type of exercise program may be helpful. It is important to maintain your strength. Pace your physical activity to avoid shortness of breath or chest pain. Rest for 1 hour before and after meals. A cardiac rehabilitation program may be helpful to some people with HF.   Diet. Eat a heart healthy diet. Food choices should be low in saturated fat and cholesterol. Talk to a dietician to learn about heart healthy foods.   Salt intake. When you have HF, you need to limit the amount of salt you eat. Eat less than 1500 milligrams (mg) of salt per day or as recommended by your caregiver.   Weight monitoring. Weigh yourself every day. You  should weigh yourself in the morning after you urinate and before you eat breakfast. Wear the same amount of clothing each time you weigh yourself. Record your weight daily. Bring your recorded weights to your clinic visits. Tell your caregiver right away if you have gained 3 lb/1.4 kg in 1 day, or 5 lb/2.3 kg in a week or whatever amount you were told to report.   Blood pressure monitoring. This should be done as directed by your caregiver. A home blood pressure cuff can be purchased at a drugstore. Record your blood pressure numbers and bring them to your clinic visits. Tell your caregiver if you become dizzy or lightheaded upon standing up.   Smoking. If you are currently a smoker, it is time to quit. Nicotine makes your heart work harder by causing your blood vessels to constrict. Do not use nicotine gum or patches before talking to your caregiver.   Follow up. Be sure to schedule a follow-up visit with your caregiver. Keep all your appointments.  SEEK MEDICAL  CARE IF:   Your weight increases by 3 lb/1.4 kg in 1 day or 5 lb/2.3 kg in a week.   You notice increasing shortness of breath that is unusual for you. This may happen during rest, sleep, or with activity.   You cough more than normal, especially with physical activity.   You notice more swelling in your hands, feet, ankles, or belly (abdomen).   You are unable to sleep because it is hard to breathe.   You cough up bloody mucus (sputum).   You begin to feel "jumping" or "fluttering" sensations (palpitations) in your chest.  SEEK IMMEDIATE MEDICAL CARE IF:   You have severe chest pain or pressure which may include symptoms such as:   Pain or pressure in the arms, neck, jaw, or back.   Feeling sweaty.   Feeling sick to your stomach (nauseous).   Feeling short of breath while at rest.   Having a fast or irregular heartbeat.   You experience stroke symptoms. These symptoms include:   Facial weakness or numbness.   Weakness or numbness in an arm, leg, or on one side of your body.   Blurred vision.   Difficulty talking or thinking.   Dizziness or fainting.   Severe headache.  THESE ARE MEDICAL EMERGENCIES. Do not wait to see if the symptoms go away. Call your local emergency services (911 in [place] DO NOT drive yourself to the hospital. IMPORTANT  Make a list of every medicine, vitamin, or herbal supplement you are taking. Keep the list with you at all times. Show it to your caregiver at every visit. Keep the list up-to-date.   Ask your caregiver or pharmacist to write an explanation of each medicine you are taking. This should include:   Why you are taking it.   The possible side effects.   The best time of day to take it.   Foods to take with it or what foods to avoid.   When to stop taking it.  MAKE SURE YOU:   Understand these instructions.   Will watch your condition.   Will get help right away if you are not doing well or get worse.  Document Released:  04/23/2005 Document Revised: 04/12/2011 Document Reviewed: 08/05/2009 [facility] Patient Information 2012 [place], [place].  Chest Pain (Nonspecific) It is often hard to give a specific diagnosis for the cause of chest pain. There is always a chance that your pain could be related to something serious,  such as a heart attack or a blood clot in the lungs. You need to follow up with your caregiver for further evaluation. CAUSES   Heartburn.   Pneumonia or bronchitis.   Anxiety or stress.   Inflammation around your heart (pericarditis) or lung (pleuritis or pleurisy).   A blood clot in the lung.   A collapsed lung (pneumothorax). It can develop suddenly on its own (spontaneous pneumothorax) or from injury (trauma) to the chest.   Shingles infection (herpes zoster virus).  The chest wall is composed of bones, muscles, and cartilage. Any of these can be the source of the pain.  The bones can be bruised by injury.   The muscles or cartilage can be strained by coughing or overwork.   The cartilage can be affected by inflammation and become sore (costochondritis).  DIAGNOSIS  Lab tests or other studies, such as X-rays, electrocardiography, stress testing, or cardiac imaging, may be needed to find the cause of your pain.  TREATMENT   Treatment depends on what may be causing your chest pain. Treatment may include:   Acid blockers for heartburn.   Anti-inflammatory medicine.   Pain medicine for inflammatory conditions.   Antibiotics if an infection is present.   You may be advised to change lifestyle habits. This includes stopping smoking and avoiding alcohol, caffeine, and chocolate.   You may be advised to keep your head raised (elevated) when sleeping. This reduces the chance of acid going backward from your stomach into your esophagus.   Most of the time, nonspecific chest pain will improve within 2 to 3 days with rest and mild pain medicine.  HOME CARE INSTRUCTIONS   If  antibiotics were prescribed, take your antibiotics as directed. Finish them even if you start to feel better.   For the next few days, avoid physical activities that bring on chest pain. Continue physical activities as directed.   Do not smoke.   Avoid drinking alcohol.   Only take over-the-counter or prescription medicine for pain, discomfort, or fever as directed by your caregiver.   Follow your caregiver's suggestions for further testing if your chest pain does not go away.   Keep any follow-up appointments you made. If you do not go to an appointment, you could develop lasting (chronic) problems with pain. If there is any problem keeping an appointment, you must call to reschedule.  SEEK MEDICAL CARE IF:   You think you are having problems from the medicine you are taking. Read your medicine instructions carefully.   Your chest pain does not go away, even after treatment.   You develop a rash with blisters on your chest.  SEEK IMMEDIATE MEDICAL CARE IF:   You have increased chest pain or pain that spreads to your arm, neck, jaw, back, or abdomen.   You develop shortness of breath, an increasing cough, or you are coughing up blood.   You have severe back or abdominal pain, feel nauseous, or vomit.   You develop severe weakness, fainting, or chills.   You have a fever.  THIS IS AN EMERGENCY. Do not wait to see if the pain will go away. Get medical help at once. Call your local emergency services (911 in [place] Do not drive yourself to the hospital. MAKE SURE YOU:   Understand these instructions.   Will watch your condition.   Will get help right away if you are not doing well or get worse.  Document Released: 01/31/2005 Document Revised: 04/12/2011 Document Reviewed: 11/27/2007 [employer]  Patient Information 2012 [employer]. 

## 2012-01-08 ENCOUNTER — Encounter: Payer: Self-pay | Admitting: Internal Medicine

## 2012-01-08 ENCOUNTER — Ambulatory Visit (INDEPENDENT_AMBULATORY_CARE_PROVIDER_SITE_OTHER): Payer: Medicare Other | Admitting: Internal Medicine

## 2012-01-08 VITALS — BP 162/80 | HR 73 | Ht 71.0 in | Wt 262.5 lb

## 2012-01-08 DIAGNOSIS — R000 Tachycardia, unspecified: Secondary | ICD-10-CM

## 2012-01-08 DIAGNOSIS — I2510 Atherosclerotic heart disease of native coronary artery without angina pectoris: Secondary | ICD-10-CM

## 2012-01-08 DIAGNOSIS — I10 Essential (primary) hypertension: Secondary | ICD-10-CM

## 2012-01-08 DIAGNOSIS — I472 Ventricular tachycardia: Secondary | ICD-10-CM

## 2012-01-08 DIAGNOSIS — I7090 Unspecified atherosclerosis: Secondary | ICD-10-CM

## 2012-01-08 MED ORDER — AMIODARONE HCL 200 MG PO TABS: 200.0000 mg | ORAL_TABLET | Freq: Every day | ORAL | Status: DC

## 2012-01-08 NOTE — Assessment & Plan Note (Signed)
His ventricular tachycardia appears to be well-controlled. I've recommended that he decrease his dose of amiodarone to 200 mg daily he will continue his other medications.Marland Kitchen

## 2012-01-08 NOTE — Patient Instructions (Addendum)
Your physician wants you to follow-up in: 12 months with Dr. Taylor. You will receive a reminder letter in the mail two months in advance. If you don't receive a letter, please call our office to schedule the follow-up appointment.    

## 2012-01-08 NOTE — Progress Notes (Signed)
HPI [name] returns today for followup. He is a very pleasant 78 year old man with an ischemic cardiomyopathy, mild to moderate left ventricular dysfunction, class II congestive heart failure, and ventricular tachycardia. At the time of his hospitalization approximately 7 months ago, he refused invasive evaluation. He specifically refused catheterization and ICD implantation. The patient has done well in the interim. He does have symptoms of claudication. He denies chest pain or shortness of breath. I suspect he is fairly sedentary. No syncope. His blood pressure is elevated in the office today but his family notes that when they checked his blood pressure at home, it is controlled. No Known Allergies   Current Outpatient Prescriptions  Medication Sig Dispense Refill  . allopurinol (ZYLOPRIM) 300 MG tablet Take 300 mg by mouth daily.      Marland Kitchen ALPRAZolam (XANAX) 0.5 MG tablet Take 0.5 mg by mouth 3 (three) times daily as needed. For anxiety      . amiodarone (PACERONE) 200 MG tablet Take 1 tablet (200 mg total) by mouth daily.  60 tablet  6  . amLODipine (NORVASC) 5 MG tablet Take 5 mg by mouth daily.      Marland Kitchen atorvastatin (LIPITOR) 80 MG tablet Take 40 mg by mouth 2 (two) times daily.      . carvedilol (COREG) 25 MG tablet Take 25 mg by mouth 2 (two) times daily with a meal.      . dipyridamole-aspirin (AGGRENOX) 25-200 MG per 12 hr capsule Take 1 capsule by mouth 2 (two) times daily.      . fenofibrate 160 MG tablet Take 160 mg by mouth daily.      . furosemide (LASIX) 40 MG tablet Take 40 mg by mouth daily. May take twice daily if needed      . glimepiride (AMARYL) 4 MG tablet Take 4 mg by mouth 2 (two) times daily with a meal.       . hydrALAZINE (APRESOLINE) 50 MG tablet Take 50 mg by mouth 4 (four) times daily.      Marland Kitchen HYDROcodone-acetaminophen (VICODIN) 5-500 MG per tablet Take 1 tablet by mouth every 6 (six) hours as needed. For pain      . imipramine (TOFRANIL) 25 MG tablet Take 50 mg by  mouth at bedtime.      . insulin glargine (LANTUS) 100 UNIT/ML injection Inject 25 Units into the skin at bedtime.      . levETIRAcetam (KEPPRA) 500 MG tablet Take 500 mg by mouth 2 (two) times daily.      Marland Kitchen levothyroxine (SYNTHROID, LEVOTHROID) 200 MCG tablet Take 200 mcg by mouth daily.      Marland Kitchen levothyroxine (SYNTHROID, LEVOTHROID) 25 MCG tablet Take 25 mcg by mouth daily.      Marland Kitchen losartan (COZAAR) 25 MG tablet Take 1 tablet (25 mg total) by mouth daily.  30 tablet  6  . nitroGLYCERIN (NITROSTAT) 0.4 MG SL tablet Place 1 tablet (0.4 mg total) under the tongue every 5 (five) minutes as needed for chest pain (Up to 3 doses).  25 tablet  3  . phenytoin (DILANTIN) 100 MG ER capsule Take 100-300 mg by mouth 2 (two) times daily. 100 mg in the morning and 300 mg at night      . potassium chloride (K-DUR) 10 MEQ tablet Take 2 tablets (20 mEq total) by mouth daily.      . promethazine (PHENERGAN) 25 MG tablet Take 25 mg by mouth every 8 (eight) hours as needed. For nausea/vomiting      .  Tamsulosin HCl (FLOMAX) 0.4 MG CAPS Take 0.4 mg by mouth 2 (two) times daily.      Marland Kitchen DISCONTD: amiodarone (PACERONE) 200 MG tablet Take 1 tablet (200 mg total) by mouth 2 (two) times daily.  60 tablet  6  . DISCONTD: atorvastatin (LIPITOR) 40 MG tablet Take 40 mg by mouth daily.         Past Medical History  Diagnosis Date  . Arteriosclerotic cardiovascular disease (ASCVD) 2000    CABG surgery in 2000 following acute MI; moderate LV dysfunction; EF of 45-50% by echocardiogram in 2005; 05/2010-admitted with CHF and hypertension, EF of 45% by echo, patient refused cardiac catheterization  . Gout   . Hypothyroidism   . Seizures   . Dysrhythmia     wide complex tachycardia  . Diabetes mellitus, type 2     insulin dependent  . Cerebrovascular disease 2004    Left cerebral CVA followed by carotid endarterectomy in [place]  . Hypertension   . Gastritis 2004    with gastric erosions  . Hyperlipidemia     ROS:    All systems reviewed and negative except as noted in the HPI.   Past Surgical History  Procedure Date  . Coronary artery bypass graft 04/1999    Dr. Barry Dienes  . Carotid endarterectomy   . Cholecystectomy   . Cardiac surgery      Family History  Problem Relation Age of Onset  . Heart disease Father      History   Social History  . Marital Status: Widowed    Spouse Name: N/A    Number of Children: 5  . Years of Education: N/A   Occupational History  . Retired     Previously employed by [employer]   Social History Main Topics  . Smoking status: Former Smoker -- 1.5 packs/day for 40 years    Quit date: 04/08/1999  . Smokeless tobacco: Never Used   Comment: quit 1999  . Alcohol Use: No  . Drug Use: No  . Sexually Active: Not Currently   Other Topics Concern  . Not on file   Social History Narrative  . No narrative on file     BP 162/80  Pulse 73  Ht 5\' 11"  (1.803 m)  Wt 262 lb 8 oz (119.069 kg)  BMI 36.61 kg/m2  Physical Exam:  Well appearing 78 year old man, NAD HEENT: Unremarkable Neck:  No JVD, no thyromegally Lungs:  Clear with no wheezes, rales, or rhonchi. HEART:  Regular rate rhythm, no murmurs, no rubs, no clicks Abd:  soft, positive bowel sounds, no organomegally, no rebound, no guarding Ext:  2 plus pulses, no edema, no cyanosis, no clubbing Skin:  No rashes no nodules Neuro:  CN II through XII intact, motor grossly intact  EKG Normal sinus rhythm with rightward axis and right bundle branch block   Assess/Plan:

## 2012-01-08 NOTE — Assessment & Plan Note (Signed)
His blood pressure is elevated today. I've encouraged the patient to continue his current medications and maintain a low-sodium diet. His family states that his blood pressure is typically controlled at home.

## 2012-02-15 ENCOUNTER — Other Ambulatory Visit: Payer: Self-pay | Admitting: Internal Medicine

## 2012-02-15 DIAGNOSIS — R000 Tachycardia, unspecified: Secondary | ICD-10-CM

## 2012-02-15 DIAGNOSIS — I472 Ventricular tachycardia: Secondary | ICD-10-CM

## 2012-02-15 MED ORDER — AMIODARONE HCL 200 MG PO TABS: 200.0000 mg | ORAL_TABLET | Freq: Every day | ORAL | Status: DC

## 2012-07-12 ENCOUNTER — Inpatient Hospital Stay (HOSPITAL_COMMUNITY)
Admission: EM | Admit: 2012-07-12 | Discharge: 2012-07-17 | DRG: 292 | Disposition: A | Discharge status: Home Health | Payer: Medicare Other | Attending: Pulmonary Disease | Admitting: Pulmonary Disease

## 2012-07-12 ENCOUNTER — Encounter (HOSPITAL_COMMUNITY): Payer: Self-pay | Admitting: Emergency Medicine

## 2012-07-12 ENCOUNTER — Emergency Department (HOSPITAL_COMMUNITY): Payer: Medicare Other

## 2012-07-12 DIAGNOSIS — E119 Type 2 diabetes mellitus without complications: Secondary | ICD-10-CM | POA: Diagnosis present

## 2012-07-12 DIAGNOSIS — Z87891 Personal history of nicotine dependence: Secondary | ICD-10-CM

## 2012-07-12 DIAGNOSIS — Z794 Long term (current) use of insulin: Secondary | ICD-10-CM

## 2012-07-12 DIAGNOSIS — E785 Hyperlipidemia, unspecified: Secondary | ICD-10-CM | POA: Diagnosis present

## 2012-07-12 DIAGNOSIS — N289 Disorder of kidney and ureter, unspecified: Secondary | ICD-10-CM

## 2012-07-12 DIAGNOSIS — E039 Hypothyroidism, unspecified: Secondary | ICD-10-CM | POA: Diagnosis present

## 2012-07-12 DIAGNOSIS — M109 Gout, unspecified: Secondary | ICD-10-CM | POA: Diagnosis present

## 2012-07-12 DIAGNOSIS — Z8673 Personal history of transient ischemic attack (TIA), and cerebral infarction without residual deficits: Secondary | ICD-10-CM

## 2012-07-12 DIAGNOSIS — G40909 Epilepsy, unspecified, not intractable, without status epilepticus: Secondary | ICD-10-CM | POA: Diagnosis present

## 2012-07-12 DIAGNOSIS — N189 Chronic kidney disease, unspecified: Secondary | ICD-10-CM | POA: Diagnosis present

## 2012-07-12 DIAGNOSIS — R0602 Shortness of breath: Secondary | ICD-10-CM

## 2012-07-12 DIAGNOSIS — I5022 Chronic systolic (congestive) heart failure: Secondary | ICD-10-CM | POA: Diagnosis present

## 2012-07-12 DIAGNOSIS — I10 Essential (primary) hypertension: Secondary | ICD-10-CM | POA: Diagnosis present

## 2012-07-12 DIAGNOSIS — I5023 Acute on chronic systolic (congestive) heart failure: Principal | ICD-10-CM | POA: Diagnosis present

## 2012-07-12 DIAGNOSIS — Z951 Presence of aortocoronary bypass graft: Secondary | ICD-10-CM

## 2012-07-12 DIAGNOSIS — I2510 Atherosclerotic heart disease of native coronary artery without angina pectoris: Secondary | ICD-10-CM | POA: Diagnosis present

## 2012-07-12 DIAGNOSIS — Z79899 Other long term (current) drug therapy: Secondary | ICD-10-CM

## 2012-07-12 DIAGNOSIS — I129 Hypertensive chronic kidney disease with stage 1 through stage 4 chronic kidney disease, or unspecified chronic kidney disease: Secondary | ICD-10-CM | POA: Diagnosis present

## 2012-07-12 DIAGNOSIS — I509 Heart failure, unspecified: Secondary | ICD-10-CM | POA: Diagnosis present

## 2012-07-12 DIAGNOSIS — I679 Cerebrovascular disease, unspecified: Secondary | ICD-10-CM | POA: Diagnosis present

## 2012-07-12 DIAGNOSIS — J441 Chronic obstructive pulmonary disease with (acute) exacerbation: Secondary | ICD-10-CM | POA: Diagnosis present

## 2012-07-12 DIAGNOSIS — R0902 Hypoxemia: Secondary | ICD-10-CM

## 2012-07-12 LAB — BASIC METABOLIC PANEL
CO2: 27 mEq/L (ref 19–32)
Calcium: 10.3 mg/dL (ref 8.4–10.5)
Creatinine, Ser: 1.72 mg/dL — ABNORMAL HIGH (ref 0.50–1.35)

## 2012-07-12 LAB — CBC WITH DIFFERENTIAL/PLATELET
Basophils Absolute: 0 10*3/uL (ref 0.0–0.1)
Basophils Relative: 1 % (ref 0–1)
Eosinophils Relative: 2 % (ref 0–5)
HCT: 39.9 % (ref 39.0–52.0)
Lymphocytes Relative: 13 % (ref 12–46)
MCHC: 32.1 g/dL (ref 30.0–36.0)
MCV: 94.8 fL (ref 78.0–100.0)
Monocytes Absolute: 0.4 10*3/uL (ref 0.1–1.0)
RDW: 15.7 % — ABNORMAL HIGH (ref 11.5–15.5)

## 2012-07-12 LAB — GLUCOSE, CAPILLARY: Glucose-Capillary: 252 mg/dL — ABNORMAL HIGH (ref 70–99)

## 2012-07-12 LAB — PRO B NATRIURETIC PEPTIDE: Pro B Natriuretic peptide (BNP): 1769 pg/mL — ABNORMAL HIGH (ref 0–450)

## 2012-07-12 MED ORDER — NITROGLYCERIN 0.4 MG SL SUBL: 0.4000 mg | SUBLINGUAL_TABLET | SUBLINGUAL | Status: DC | PRN

## 2012-07-12 MED ORDER — LOSARTAN POTASSIUM 50 MG PO TABS
25.0000 mg | ORAL_TABLET | Freq: Every day | ORAL | Status: DC
  Administered 2012-07-13 – 2012-07-17 (×5): 25 mg via ORAL
  Filled 2012-07-12 (×7): qty 1

## 2012-07-12 MED ORDER — ALBUTEROL SULFATE (5 MG/ML) 0.5% IN NEBU
5.0000 mg | INHALATION_SOLUTION | Freq: Once | RESPIRATORY_TRACT | Status: AC
  Administered 2012-07-12: 5 mg via RESPIRATORY_TRACT
  Filled 2012-07-12: qty 1

## 2012-07-12 MED ORDER — LEVOTHYROXINE SODIUM 100 MCG PO TABS
200.0000 ug | ORAL_TABLET | Freq: Every day | ORAL | Status: DC
  Administered 2012-07-13 – 2012-07-17 (×5): 200 ug via ORAL
  Filled 2012-07-12: qty 2
  Filled 2012-07-12: qty 1
  Filled 2012-07-12: qty 2
  Filled 2012-07-12: qty 1
  Filled 2012-07-12 (×3): qty 2

## 2012-07-12 MED ORDER — TAMSULOSIN HCL 0.4 MG PO CAPS
0.4000 mg | ORAL_CAPSULE | Freq: Two times a day (BID) | ORAL | Status: DC
  Administered 2012-07-12 – 2012-07-17 (×10): 0.4 mg via ORAL
  Filled 2012-07-12 (×11): qty 1

## 2012-07-12 MED ORDER — AMIODARONE HCL 200 MG PO TABS
200.0000 mg | ORAL_TABLET | Freq: Every day | ORAL | Status: DC
  Administered 2012-07-13 – 2012-07-17 (×5): 200 mg via ORAL
  Filled 2012-07-12 (×5): qty 1

## 2012-07-12 MED ORDER — AMLODIPINE BESYLATE 5 MG PO TABS
5.0000 mg | ORAL_TABLET | Freq: Every day | ORAL | Status: DC
  Administered 2012-07-13 – 2012-07-17 (×5): 5 mg via ORAL
  Filled 2012-07-12 (×5): qty 1

## 2012-07-12 MED ORDER — AMLODIPINE BESYLATE 5 MG PO TABS: 5.0000 mg | ORAL_TABLET | Freq: Every day | ORAL | Status: DC

## 2012-07-12 MED ORDER — ASPIRIN-DIPYRIDAMOLE ER 25-200 MG PO CP12
1.0000 | ORAL_CAPSULE | Freq: Two times a day (BID) | ORAL | Status: DC
  Administered 2012-07-13 – 2012-07-17 (×9): 1 via ORAL
  Filled 2012-07-12 (×12): qty 1

## 2012-07-12 MED ORDER — ALBUTEROL SULFATE (5 MG/ML) 0.5% IN NEBU: 5.0000 mg | INHALATION_SOLUTION | RESPIRATORY_TRACT | Status: AC | PRN

## 2012-07-12 MED ORDER — PHENYTOIN SODIUM EXTENDED 100 MG PO CAPS: 100.0000 mg | ORAL_CAPSULE | Freq: Two times a day (BID) | ORAL | Status: DC

## 2012-07-12 MED ORDER — CARVEDILOL 12.5 MG PO TABS
25.0000 mg | ORAL_TABLET | Freq: Two times a day (BID) | ORAL | Status: DC
  Administered 2012-07-13: 25 mg via ORAL
  Administered 2012-07-13: 12.5 mg via ORAL
  Administered 2012-07-14 – 2012-07-17 (×7): 25 mg via ORAL
  Filled 2012-07-12 (×2): qty 1
  Filled 2012-07-12 (×2): qty 2
  Filled 2012-07-12 (×2): qty 1
  Filled 2012-07-12 (×6): qty 2
  Filled 2012-07-12: qty 1

## 2012-07-12 MED ORDER — ALPRAZOLAM 0.5 MG PO TABS
0.5000 mg | ORAL_TABLET | Freq: Three times a day (TID) | ORAL | Status: DC | PRN
  Administered 2012-07-12 – 2012-07-15 (×6): 0.5 mg via ORAL
  Filled 2012-07-12 (×6): qty 1

## 2012-07-12 MED ORDER — LEVETIRACETAM 500 MG PO TABS
500.0000 mg | ORAL_TABLET | Freq: Two times a day (BID) | ORAL | Status: DC
  Administered 2012-07-12 – 2012-07-17 (×10): 500 mg via ORAL
  Filled 2012-07-12 (×10): qty 1

## 2012-07-12 MED ORDER — POTASSIUM CHLORIDE CRYS ER 10 MEQ PO TBCR
20.0000 meq | EXTENDED_RELEASE_TABLET | Freq: Every day | ORAL | Status: DC
  Administered 2012-07-13 – 2012-07-17 (×5): 20 meq via ORAL
  Filled 2012-07-12: qty 2
  Filled 2012-07-12: qty 1
  Filled 2012-07-12 (×5): qty 2

## 2012-07-12 MED ORDER — HYDROCODONE-ACETAMINOPHEN 5-325 MG PO TABS
1.0000 | ORAL_TABLET | Freq: Four times a day (QID) | ORAL | Status: DC | PRN
  Administered 2012-07-14: 1 via ORAL
  Filled 2012-07-12: qty 1

## 2012-07-12 MED ORDER — HYDRALAZINE HCL 25 MG PO TABS
50.0000 mg | ORAL_TABLET | Freq: Four times a day (QID) | ORAL | Status: DC
  Administered 2012-07-12 – 2012-07-17 (×15): 50 mg via ORAL
  Filled 2012-07-12 (×5): qty 2
  Filled 2012-07-12: qty 1
  Filled 2012-07-12 (×11): qty 2

## 2012-07-12 MED ORDER — IPRATROPIUM BROMIDE 0.02 % IN SOLN
0.5000 mg | Freq: Once | RESPIRATORY_TRACT | Status: AC
  Administered 2012-07-12: 0.5 mg via RESPIRATORY_TRACT
  Filled 2012-07-12: qty 2.5

## 2012-07-12 MED ORDER — INSULIN GLARGINE 100 UNIT/ML ~~LOC~~ SOLN
25.0000 [IU] | Freq: Every day | SUBCUTANEOUS | Status: DC
  Administered 2012-07-12: 25 [IU] via SUBCUTANEOUS
  Administered 2012-07-13: 21:00:00 via SUBCUTANEOUS
  Administered 2012-07-14 – 2012-07-16 (×3): 25 [IU] via SUBCUTANEOUS

## 2012-07-12 MED ORDER — FENOFIBRATE 160 MG PO TABS
160.0000 mg | ORAL_TABLET | Freq: Every day | ORAL | Status: DC
  Administered 2012-07-13 – 2012-07-17 (×5): 160 mg via ORAL
  Filled 2012-07-12 (×6): qty 1

## 2012-07-12 MED ORDER — PHENYTOIN SODIUM EXTENDED 100 MG PO CAPS
300.0000 mg | ORAL_CAPSULE | Freq: Every day | ORAL | Status: DC
  Administered 2012-07-12 – 2012-07-16 (×5): 300 mg via ORAL
  Filled 2012-07-12 (×4): qty 3

## 2012-07-12 MED ORDER — ENOXAPARIN SODIUM 60 MG/0.6ML ~~LOC~~ SOLN
60.0000 mg | SUBCUTANEOUS | Status: DC
  Administered 2012-07-13 – 2012-07-16 (×4): 60 mg via SUBCUTANEOUS
  Filled 2012-07-12 (×4): qty 0.6

## 2012-07-12 MED ORDER — ATORVASTATIN CALCIUM 40 MG PO TABS
40.0000 mg | ORAL_TABLET | Freq: Two times a day (BID) | ORAL | Status: DC
  Administered 2012-07-12 – 2012-07-17 (×10): 40 mg via ORAL
  Filled 2012-07-12 (×11): qty 1

## 2012-07-12 MED ORDER — FUROSEMIDE 10 MG/ML IJ SOLN
40.0000 mg | Freq: Two times a day (BID) | INTRAMUSCULAR | Status: DC
  Administered 2012-07-13 – 2012-07-15 (×6): 40 mg via INTRAVENOUS
  Filled 2012-07-12 (×6): qty 4

## 2012-07-12 MED ORDER — FUROSEMIDE 10 MG/ML IJ SOLN: 40.0000 mg | Freq: Two times a day (BID) | INTRAMUSCULAR | Status: DC

## 2012-07-12 MED ORDER — PHENYTOIN SODIUM EXTENDED 100 MG PO CAPS
100.0000 mg | ORAL_CAPSULE | Freq: Every day | ORAL | Status: DC
  Administered 2012-07-13 – 2012-07-17 (×5): 100 mg via ORAL
  Filled 2012-07-12 (×2): qty 1
  Filled 2012-07-12: qty 3
  Filled 2012-07-12 (×3): qty 1

## 2012-07-12 MED ORDER — LEVOTHYROXINE SODIUM 25 MCG PO TABS
25.0000 ug | ORAL_TABLET | Freq: Every day | ORAL | Status: DC
  Administered 2012-07-13 – 2012-07-17 (×5): 25 ug via ORAL
  Filled 2012-07-12 (×5): qty 1

## 2012-07-12 MED ORDER — ALLOPURINOL 300 MG PO TABS
300.0000 mg | ORAL_TABLET | Freq: Every day | ORAL | Status: DC
  Administered 2012-07-13 – 2012-07-17 (×5): 300 mg via ORAL
  Filled 2012-07-12 (×5): qty 1

## 2012-07-12 MED ORDER — IMIPRAMINE HCL 25 MG PO TABS
50.0000 mg | ORAL_TABLET | Freq: Every day | ORAL | Status: DC
  Administered 2012-07-13 – 2012-07-16 (×4): 50 mg via ORAL
  Filled 2012-07-12 (×4): qty 2

## 2012-07-12 MED ORDER — GLIMEPIRIDE 2 MG PO TABS
4.0000 mg | ORAL_TABLET | Freq: Two times a day (BID) | ORAL | Status: DC
  Administered 2012-07-13 – 2012-07-17 (×9): 4 mg via ORAL
  Filled 2012-07-12 (×2): qty 2
  Filled 2012-07-12 (×2): qty 1
  Filled 2012-07-12 (×2): qty 2
  Filled 2012-07-12: qty 1
  Filled 2012-07-12: qty 2
  Filled 2012-07-12: qty 1
  Filled 2012-07-12 (×4): qty 2

## 2012-07-12 MED ORDER — NITROGLYCERIN 2 % TD OINT
1.0000 [in_us] | TOPICAL_OINTMENT | Freq: Once | TRANSDERMAL | Status: AC
  Administered 2012-07-12: 1 [in_us] via TOPICAL
  Filled 2012-07-12: qty 1

## 2012-07-12 MED ORDER — FUROSEMIDE 10 MG/ML IJ SOLN
80.0000 mg | Freq: Once | INTRAMUSCULAR | Status: AC
  Administered 2012-07-12: 80 mg via INTRAVENOUS
  Filled 2012-07-12: qty 8

## 2012-07-12 NOTE — ED Notes (Signed)
Patient with c/o shortness of breath since Thursday, worsening today per daughter. Patient alert/oriented to baseline. Long cardiac history.

## 2012-07-12 NOTE — Progress Notes (Signed)
PT RECEIVED TO ROOM 309 AT 1720. UNABLE TO TRANFER PT FROM ER TO ROOM 309 IN THE COMPUTER.PT IS ADMITTED AND NARRATOR/ADMISSION ASSESSMENT COMPLETED. UNABLE TO OBTAIN ORDERED MEDICATION D/T TO PT NOT SHOWING UP IN [facility] COMPUTER AS ADMITTED. BED PLACEMENT STAFF IS ATTEMPTING TO CORRECT THIS PROBLEM AS SOON AS POSSIBLE.

## 2012-07-12 NOTE — ED Notes (Signed)
Attempted to call report, RN to call back.
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
## 2012-07-13 LAB — BASIC METABOLIC PANEL
CO2: 31 mEq/L (ref 19–32)
Chloride: 103 mEq/L (ref 96–112)
GFR calc Af Amer: 41 mL/min — ABNORMAL LOW (ref >90)
Potassium: 3.6 mEq/L (ref 3.5–5.1)
Sodium: 140 mEq/L (ref 135–145)

## 2012-07-13 LAB — GLUCOSE, CAPILLARY: Glucose-Capillary: 282 mg/dL — ABNORMAL HIGH (ref 70–99)

## 2012-07-13 LAB — TROPONIN I: Troponin I: <0.3 ng/mL (ref <0.30)
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
## 2012-07-13 NOTE — Progress Notes (Signed)
Subjective: Patient was admitted last night due cough and congestion. He is complaining of leg swelling and shortness of breath.  Objective: Vital signs in last 24 hours: Temp:  [97 F (36.1 C)-98.4 F (36.9 C)] 97 F (36.1 C) (03/09 0547) Pulse Rate:  [54-67] 67 (03/09 0547) Resp:  [15-32] 20 (03/09 0547) BP: (120-157)/(51-69) 136/63 mmHg (03/09 0547) SpO2:  [81 %-100 %] 93 % (03/09 0547) Weight:  [117.7 kg (259 lb 7.7 oz)-119.296 kg (263 lb)] 117.7 kg (259 lb 7.7 oz) (03/08 1720) Weight change:  Last BM Date: 07/12/12  Intake/Output from previous day: 03/08 0701 - 03/09 0700 In: 840 [P.O.:840] Out: 1600 [Urine:1600]  PHYSICAL EXAM General appearance: alert, no distress and slowed mentation Resp: diminished breath sounds bibasilar and rales bibasilar Cardio: regular rate and rhythm GI: soft, non-tender; bowel sounds normal; no masses,  no organomegaly Extremities: edema 3+++ bilateral leg edema  Lab Results:    @labtest @ [facility] No results found for this basename: PHART, PCO2, PO2ART, TCO2, HCO3,  in the last 72 hours CULTURES No results found for this or any previous visit (from the past 240 hour(s)). Studies/Results: Dg Chest Portable 1 View  07/12/2012  *RADIOLOGY REPORT*  Clinical Data: Shortness of breath  PORTABLE CHEST - 1 VIEW  Comparison: 10/02/2011  Findings: Cardiomegaly with pulmonary vascular congestion.  No frank interstitial edema.  No pleural effusion or pneumothorax.  Postsurgical changes related to prior CABG.  IMPRESSION: Cardiomegaly with pulmonary vascular congestion.  No frank interstitial edema.   Original Report Authenticated By: Charline Bills, M.D.     Medications: I have reviewed the patient's current medications.  Assesment: 1. Acute on chronic CHF  2. CAD -  3. Hypertension  4. Hyperlipidemia  5. Diabetes Mellitus Type 2  6. CKD (baseline 1.8-2.4)  7. CVA - Left cerebral CVA followed by carotid endarterectomy in [place] 2004  8.  Hypothyroidism  9. Seizures  10. Gastritis with gastric erosions  11. Gout     Active Problems:   * No active hospital problems. *    Plan: Continue Iv diuretics Telemetry  We will monitor BMP    LOS: 1 day   FANTA,TESFAYE 07/13/2012, 11:52 AM
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
## 2012-07-14 DIAGNOSIS — J441 Chronic obstructive pulmonary disease with (acute) exacerbation: Secondary | ICD-10-CM | POA: Diagnosis present

## 2012-07-14 DIAGNOSIS — I5023 Acute on chronic systolic (congestive) heart failure: Principal | ICD-10-CM | POA: Diagnosis present

## 2012-07-14 LAB — BASIC METABOLIC PANEL
CO2: 31 mEq/L (ref 19–32)
Chloride: 104 mEq/L (ref 96–112)
GFR calc Af Amer: 39 mL/min — ABNORMAL LOW (ref >90)
Sodium: 141 mEq/L (ref 135–145)

## 2012-07-14 LAB — MRSA PCR SCREENING: MRSA by PCR: NEGATIVE

## 2012-07-14 LAB — GLUCOSE, CAPILLARY: Glucose-Capillary: 219 mg/dL — ABNORMAL HIGH (ref 70–99)

## 2012-07-14 NOTE — Care Management Note (Signed)
    Page 1 of 2   07/17/2012     11:47:24 AM   CARE MANAGEMENT NOTE 07/17/2012  Patient:  Donald Owen, Donald Owen   Account Number:  [id number]  Date Initiated:  07/14/2012  Documentation initiated by:  Sharrie Rothman  Subjective/Objective Assessment:   Pt admitted from home with CHF. Pt lives with his daugther who provides 24 hour care for pt. Pt has hospital bed, walker, wheelchair, BSC, and cane.  Will need HH at discharge.     Action/Plan:   Pts daughter chose [facility] for [facility]. No DME needs noted. No PT or aide needed with [facility] per daughter.   Anticipated DC Date:  07/18/2012   Anticipated DC Plan:  HOME W HOME HEALTH SERVICES      DC Planning Services  CM consult      PAC Choice  DURABLE MEDICAL EQUIPMENT  HOME HEALTH   Choice offered to / List presented to:  C-4 Adult Children   DME arranged  [facility]      DME agency  [facility]     HH arranged  HH-1 RN  HH-2 PT      [facility] agency  [facility]   Status of service:  Completed, signed off Medicare Important Message given?  YES (If response is "NO", the following Medicare IM given date fields will be blank) Date Medicare IM given:  07/17/2012 Date Additional Medicare IM given:    Discharge Disposition:  HOME W HOME HEALTH SERVICES  Per UR Regulation:    If discussed at Long Length of Stay Meetings, dates discussed:    Comments:  07/17/12 1145 Arlyss Queen, RN BSN CM Pt discharged today with [facility] RN and PT. Alroy Bailiff of [facility] is aware and will collect the pts information from the chart. Pt also chose [facility] for home O2. Tula Nakayama of [facility] is aware and will deliver portable to the pts room prior to discharge and concentrator to pts home. HH services to start within 48 hours after discharge. No other CM needs noted.  07/14/12 1125 Arlyss Queen, RN BSN CM

## 2012-07-14 NOTE — Clinical Documentation Improvement (Signed)
CHF DOCUMENTATION CLARIFICATION QUERY  THIS DOCUMENT IS NOT A PERMANENT PART OF THE MEDICAL RECORD  TO RESPOND TO THE THIS QUERY, FOLLOW THE INSTRUCTIONS BELOW:  1. If needed, update documentation for the patient's encounter via the notes activity.  2. Access this query again and click edit on the In [employer].  3. After updating, or not, click F2 to complete all highlighted (required) fields concerning your review. Select "additional documentation in the medical record" OR "no additional documentation provided".  4. Click Sign note button.  5. The deficiency will fall out of your In Basket *Please let us know if you are not able to complete this workflow by phone or e-mail (listed below).  Please update your documentation within the medical record to reflect your response to this query.                                                                                    07/14/12  Dear Dr. [facility],  In a better effort to capture your patient's severity of illness, reflect appropriate length of stay and utilization of resources, a review of the patient medical record has revealed the following indicators the diagnosis of Heart Failure.    Based on your clinical judgment, please clarify and document in a progress note and/or discharge summary the clinical condition associated with the following supporting information:  In responding to this query please exercise your independent judgment.  The fact that a query is asked, does not imply that any particular answer is desired or expected.  Please clarify type of documented CHF  Possible Clinical Conditions?  Chronic Systolic Congestive Heart Failure Chronic Diastolic Congestive Heart Failure Chronic Systolic & Diastolic Congestive Heart Failure Acute Systolic Congestive Heart Failure Acute Diastolic Congestive Heart Failure Acute Systolic & Diastolic Congestive Heart Failure Acute on Chronic Systolic Congestive Heart  Failure Acute on Chronic Diastolic Congestive Heart Failure Acute on Chronic Systolic & Diastolic  Congestive Heart Failure Other Condition________________________________________ Cannot Clinically Determine  Clinical Information:   Risk Factors: Admitted with "CHF", Acute on Chronic CHF" and "exacerbation of CHF" History of systolic CHF, CAD, CKD  Signs & Symptoms: Shortness of breath SpO2 81% on admission + 2 pitting edema  Diagnostics: BNP: 1769 Echo results: 05/22/11 EF: 45% EKG: 3/8=ectopic atrial rhythm Radiology: 3/8CXR: IMPRESSION: Cardiomegaly with pulmonary vascular congestion. No frank interstitial edema.  Treatment: Low sodium diet Lasix IV 40mg  q12h I&O qshift Daily wts.  Reviewed: additional documentation in the medical record  Thank You,  Debora T Williams  RN, MSN Clinical Documentation Specialist: Office# [phone] [facility] Health Information Management [place]

## 2012-07-14 NOTE — Progress Notes (Signed)
Subjective: He says he feels a little bit better. He is less short of breath. He has no other new complaints.  Objective: Vital signs in last 24 hours: Temp:  [97.7 F (36.5 C)-98.1 F (36.7 C)] 97.8 F (36.6 C) (03/10 0549) Pulse Rate:  [63] 63 (03/09 1300) Resp:  [18-20] 18 (03/10 0549) BP: (114-153)/(51-58) 125/58 mmHg (03/10 0549) SpO2:  [93 %-98 %] 93 % (03/10 0549) Weight change:  Last BM Date: 07/12/12  Intake/Output from previous day: 03/09 0701 - 03/10 0700 In: 480 [P.O.:480] Out: 850 [Urine:850]  PHYSICAL EXAM General appearance: alert, cooperative, no distress and morbidly obese Resp: rales bilaterally Cardio: irregularly irregular rhythm GI: soft, non-tender; bowel sounds normal; no masses,  no organomegaly Extremities: edema Trace to 1+ with venous stasis changes  Lab Results:    Basic Metabolic Panel:  Recent Labs  45/40/98 0444 07/14/12 0357  NA 140 141  K 3.6 3.8  CL 103 104  CO2 31 31  GLUCOSE 256* 197*  BUN 31* 31*  CREATININE 1.79* 1.86*  CALCIUM 9.7 9.6   Liver Function Tests: No results found for this basename: AST, ALT, ALKPHOS, BILITOT, PROT, ALBUMIN,  in the last 72 hours No results found for this basename: LIPASE, AMYLASE,  in the last 72 hours No results found for this basename: AMMONIA,  in the last 72 hours CBC:  Recent Labs  07/12/12 1216  WBC 6.7  NEUTROABS 5.2  HGB 12.8*  HCT 39.9  MCV 94.8  PLT 183   Cardiac Enzymes:  Recent Labs  07/12/12 1520 07/12/12 2124 07/13/12 0444  TROPONINI <0.30 <0.30 <0.30   BNP:  Recent Labs  07/12/12 1216  PROBNP 1769.0*   D-Dimer: No results found for this basename: DDIMER,  in the last 72 hours CBG:  Recent Labs  07/12/12 2226 07/13/12 2042  GLUCAP 252* 282*   Hemoglobin A1C: No results found for this basename: HGBA1C,  in the last 72 hours Fasting Lipid Panel: No results found for this basename: CHOL, HDL, LDLCALC, TRIG, CHOLHDL, LDLDIRECT,  in the last 72  hours Thyroid Function Tests: No results found for this basename: TSH, T4TOTAL, FREET4, T3FREE, THYROIDAB,  in the last 72 hours Anemia Panel: No results found for this basename: VITAMINB12, FOLATE, FERRITIN, TIBC, IRON, RETICCTPCT,  in the last 72 hours Coagulation: No results found for this basename: LABPROT, INR,  in the last 72 hours Urine Drug Screen: Drugs of Abuse  No results found for this basename: labopia, cocainscrnur, labbenz, amphetmu, thcu, labbarb    Alcohol Level: No results found for this basename: ETH,  in the last 72 hours Urinalysis: No results found for this basename: COLORURINE, APPERANCEUR, LABSPEC, PHURINE, GLUCOSEU, HGBUR, BILIRUBINUR, KETONESUR, PROTEINUR, UROBILINOGEN, NITRITE, LEUKOCYTESUR,  in the last 72 hours Misc. Labs:  ABGS No results found for this basename: PHART, PCO2, PO2ART, TCO2, HCO3,  in the last 72 hours CULTURES No results found for this or any previous visit (from the past 240 hour(s)). Studies/Results: Dg Chest Portable 1 View  07/12/2012  *RADIOLOGY REPORT*  Clinical Data: Shortness of breath  PORTABLE CHEST - 1 VIEW  Comparison: 10/02/2011  Findings: Cardiomegaly with pulmonary vascular congestion.  No frank interstitial edema.  No pleural effusion or pneumothorax.  Postsurgical changes related to prior CABG.  IMPRESSION: Cardiomegaly with pulmonary vascular congestion.  No frank interstitial edema.   Original Report Authenticated By: Charline Bills, M.D.     Medications:  Prior to Admission:  Prescriptions prior to admission  Medication Sig Dispense  Refill  . allopurinol (ZYLOPRIM) 300 MG tablet Take 300 mg by mouth daily.      Marland Kitchen ALPRAZolam (XANAX) 0.5 MG tablet Take 0.5 mg by mouth 3 (three) times daily as needed. For anxiety      . amiodarone (PACERONE) 200 MG tablet Take 1 tablet (200 mg total) by mouth daily.  60 tablet  6  . amLODipine (NORVASC) 5 MG tablet Take 5 mg by mouth daily.      Marland Kitchen atorvastatin (LIPITOR) 80 MG tablet  Take 40 mg by mouth 2 (two) times daily.      . carvedilol (COREG) 25 MG tablet Take 25 mg by mouth 2 (two) times daily with a meal.      . dipyridamole-aspirin (AGGRENOX) 25-200 MG per 12 hr capsule Take 1 capsule by mouth 2 (two) times daily.      . fenofibrate 160 MG tablet Take 160 mg by mouth daily.      . furosemide (LASIX) 40 MG tablet Take 40 mg by mouth daily. May take twice daily if needed      . glimepiride (AMARYL) 4 MG tablet Take 4 mg by mouth 2 (two) times daily with a meal.       . hydrALAZINE (APRESOLINE) 50 MG tablet Take 50 mg by mouth 4 (four) times daily.      Marland Kitchen HYDROcodone-acetaminophen (VICODIN) 5-500 MG per tablet Take 1 tablet by mouth every 6 (six) hours as needed. For pain      . imipramine (TOFRANIL) 25 MG tablet Take 50 mg by mouth at bedtime.      . insulin glargine (LANTUS) 100 UNIT/ML injection Inject 25 Units into the skin at bedtime.      . levETIRAcetam (KEPPRA) 500 MG tablet Take 500 mg by mouth 2 (two) times daily.      Marland Kitchen levothyroxine (SYNTHROID, LEVOTHROID) 200 MCG tablet Take 200 mcg by mouth daily. Takes tablet and 25 mcg tablet for total dose of      . levothyroxine (SYNTHROID, LEVOTHROID) 25 MCG tablet Take 25 mcg by mouth daily. Takes tablet and 25 mcg tablet for total dose of      . losartan (COZAAR) 25 MG tablet Take 25 mg by mouth daily.      . nitroGLYCERIN (NITROSTAT) 0.4 MG SL tablet Place 0.4 mg under the tongue every 5 (five) minutes x 3 doses as needed for chest pain.      . phenytoin (DILANTIN) 100 MG ER capsule Take 100-300 mg by mouth 2 (two) times daily. 100 mg in the morning and 300 mg at night      . potassium chloride (K-DUR) 10 MEQ tablet Take 20 mEq by mouth daily.      . Tamsulosin HCl (FLOMAX) 0.4 MG CAPS Take 0.4 mg by mouth 2 (two) times daily.       Scheduled: . allopurinol  300 mg Oral Daily  . amiodarone  200 mg Oral Daily  . amLODipine  5 mg Oral Daily  . atorvastatin  40 mg Oral BID  .  carvedilol  25 mg Oral BID WC  . dipyridamole-aspirin  1 capsule Oral BID  . enoxaparin (LOVENOX) injection  60 mg Subcutaneous Q24H  . fenofibrate  160 mg Oral Daily  . furosemide  40 mg Intravenous Q12H  . glimepiride  4 mg Oral BID WC  . hydrALAZINE  50 mg Oral QID  . imipramine  50 mg Oral QHS  . insulin glargine  25 Units  Subcutaneous QHS  . levETIRAcetam  500 mg Oral BID  . levothyroxine  200 mcg Oral QAC breakfast  . levothyroxine  25 mcg Oral QAC breakfast  . losartan  25 mg Oral Daily  . phenytoin  300 mg Oral QHS   And  . phenytoin  100 mg Oral Daily  . potassium chloride  20 mEq Oral Daily  . tamsulosin  0.4 mg Oral BID   Continuous:  QIO:[id number], HYDROcodone-acetaminophen, nitroGLYCERIN  Assesment: He was admitted with exacerbation of CHF. He has known severe coronary artery occlusive disease. He says he will not undergo any further invasive testing. He says he feels better. His weight and intake and output have not been recorded. Active Problems:   * No active hospital problems. *    Plan: Continue treatments he does seem to have improved. He is able to lie flat now.    LOS: 2 days   HAWKINS,EDWARD L 07/14/2012, 8:35 AM

## 2012-07-14 NOTE — Progress Notes (Signed)
Inpatient Diabetes Program Recommendations  AACE/ADA: New Consensus Statement on Inpatient Glycemic Control (2013)  Target Ranges:  Prepandial:   less than 140 mg/dL      Peak postprandial:   less than 180 mg/dL (1-2 hours)      Critically ill patients:  140 - 180 mg/dL  Reason for note: Hyperglycemia  Inpatient Diabetes Program Recommendations Insulin - Basal: Please increase basal lantus to 30 units and continue to titrate until fasting glucose is controlled Insulin - Meal Coverage: Please add 4 units meal coverage tidwc (Amaryl does not appear to be effective. Oral Agents: Please do not use Amaryl while in the hospital. PO intake can be variable.  Meal coverage is given only if pt eats greater than or equal to 50% HgbA1C: Please check as there is no documentation of any values Diet: I added carbohydrate modified to present diet orders.  Will need a cosign.  Thank you, Lenor Coffin, RN, CNS, Diabetes Coordinator [phone])

## 2012-07-15 MED ORDER — FUROSEMIDE 10 MG/ML IJ SOLN
80.0000 mg | Freq: Two times a day (BID) | INTRAMUSCULAR | Status: DC
  Administered 2012-07-15 – 2012-07-17 (×4): 80 mg via INTRAVENOUS
  Filled 2012-07-15 (×4): qty 8

## 2012-07-15 MED ORDER — SPIRONOLACTONE 25 MG PO TABS
12.5000 mg | ORAL_TABLET | Freq: Every day | ORAL | Status: DC
  Administered 2012-07-15 – 2012-07-17 (×3): 12.5 mg via ORAL
  Filled 2012-07-15 (×3): qty 1

## 2012-07-15 NOTE — Progress Notes (Signed)
Subjective: He says he feels better. He has no complaints. He says he is breathing better  Objective: Vital signs in last 24 hours: Temp:  [97.4 F (36.3 C)-97.8 F (36.6 C)] 97.4 F (36.3 C) (03/11 0547) Pulse Rate:  [54-68] 68 (03/11 0547) Resp:  [16-20] 20 (03/11 0547) BP: (102-131)/(41-65) 131/65 mmHg (03/11 0547) SpO2:  [72 %-96 %] 91 % (03/11 0800) Weight:  [116 kg (255 lb 11.7 oz)-116.4 kg (256 lb 9.9 oz)] 116.4 kg (256 lb 9.9 oz) (03/11 0547) Weight change:  Last BM Date: 07/14/12  Intake/Output from previous day: 03/10 0701 - 03/11 0700 In: 720 [P.O.:720] Out: 1225 [Urine:1225]  PHYSICAL EXAM General appearance: alert, cooperative, mild distress and morbidly obese Resp: rhonchi bilaterally Cardio: regular rate and rhythm, S1, S2 normal, no murmur, click, rub or gallop GI: soft, non-tender; bowel sounds normal; no masses,  no organomegaly Extremities: He has 1+ edema chronic venous stasis and several excoriated areas on his legs  Lab Results:    Basic Metabolic Panel:  Recent Labs  03/02/24 0444 07/14/12 0357  NA 140 141  K 3.6 3.8  CL 103 104  CO2 31 31  GLUCOSE 256* 197*  BUN 31* 31*  CREATININE 1.79* 1.86*  CALCIUM 9.7 9.6   Liver Function Tests: No results found for this basename: AST, ALT, ALKPHOS, BILITOT, PROT, ALBUMIN,  in the last 72 hours No results found for this basename: LIPASE, AMYLASE,  in the last 72 hours No results found for this basename: AMMONIA,  in the last 72 hours CBC:  Recent Labs  07/12/12 1216  WBC 6.7  NEUTROABS 5.2  HGB 12.8*  HCT 39.9  MCV 94.8  PLT 183   Cardiac Enzymes:  Recent Labs  07/12/12 1520 07/12/12 2124 07/13/12 0444  TROPONINI <0.30 <0.30 <0.30   BNP:  Recent Labs  07/12/12 1216  PROBNP 1769.0*   D-Dimer: No results found for this basename: DDIMER,  in the last 72 hours CBG:  Recent Labs  07/12/12 2226 07/13/12 2042 07/14/12 2120  GLUCAP 252* 282* 219*   Hemoglobin A1C: No  results found for this basename: HGBA1C,  in the last 72 hours Fasting Lipid Panel: No results found for this basename: CHOL, HDL, LDLCALC, TRIG, CHOLHDL, LDLDIRECT,  in the last 72 hours Thyroid Function Tests: No results found for this basename: TSH, T4TOTAL, FREET4, T3FREE, THYROIDAB,  in the last 72 hours Anemia Panel: No results found for this basename: VITAMINB12, FOLATE, FERRITIN, TIBC, IRON, RETICCTPCT,  in the last 72 hours Coagulation: No results found for this basename: LABPROT, INR,  in the last 72 hours Urine Drug Screen: Drugs of Abuse  No results found for this basename: labopia, cocainscrnur, labbenz, amphetmu, thcu, labbarb    Alcohol Level: No results found for this basename: ETH,  in the last 72 hours Urinalysis: No results found for this basename: COLORURINE, APPERANCEUR, LABSPEC, PHURINE, GLUCOSEU, HGBUR, BILIRUBINUR, KETONESUR, PROTEINUR, UROBILINOGEN, NITRITE, LEUKOCYTESUR,  in the last 72 hours Misc. Labs:  ABGS No results found for this basename: PHART, PCO2, PO2ART, TCO2, HCO3,  in the last 72 hours CULTURES Recent Results (from the past 240 hour(s))  MRSA PCR SCREENING     Status: None   Collection Time    07/14/12  7:54 AM      Result Value Range Status   MRSA by PCR NEGATIVE  NEGATIVE Final   Comment:            The GeneXpert MRSA Assay (FDA  approved for NASAL specimens     only), is one component of a     comprehensive MRSA colonization     surveillance program. It is not     intended to diagnose MRSA     infection nor to guide or     monitor treatment for     MRSA infections.   Studies/Results: No results found.  Medications:  Prior to Admission:  Prescriptions prior to admission  Medication Sig Dispense Refill  . allopurinol (ZYLOPRIM) 300 MG tablet Take 300 mg by mouth daily.      Marland Kitchen ALPRAZolam (XANAX) 0.5 MG tablet Take 0.5 mg by mouth 3 (three) times daily as needed. For anxiety      . amiodarone (PACERONE) 200 MG tablet Take 1  tablet (200 mg total) by mouth daily.  60 tablet  6  . amLODipine (NORVASC) 5 MG tablet Take 5 mg by mouth daily.      Marland Kitchen atorvastatin (LIPITOR) 80 MG tablet Take 40 mg by mouth 2 (two) times daily.      . carvedilol (COREG) 25 MG tablet Take 25 mg by mouth 2 (two) times daily with a meal.      . dipyridamole-aspirin (AGGRENOX) 25-200 MG per 12 hr capsule Take 1 capsule by mouth 2 (two) times daily.      . fenofibrate 160 MG tablet Take 160 mg by mouth daily.      . furosemide (LASIX) 40 MG tablet Take 40 mg by mouth daily. May take twice daily if needed      . glimepiride (AMARYL) 4 MG tablet Take 4 mg by mouth 2 (two) times daily with a meal.       . hydrALAZINE (APRESOLINE) 50 MG tablet Take 50 mg by mouth 4 (four) times daily.      Marland Kitchen HYDROcodone-acetaminophen (VICODIN) 5-500 MG per tablet Take 1 tablet by mouth every 6 (six) hours as needed. For pain      . imipramine (TOFRANIL) 25 MG tablet Take 50 mg by mouth at bedtime.      . insulin glargine (LANTUS) 100 UNIT/ML injection Inject 25 Units into the skin at bedtime.      . levETIRAcetam (KEPPRA) 500 MG tablet Take 500 mg by mouth 2 (two) times daily.      Marland Kitchen levothyroxine (SYNTHROID, LEVOTHROID) 200 MCG tablet Take 200 mcg by mouth daily. Takes tablet and 25 mcg tablet for total dose of      . levothyroxine (SYNTHROID, LEVOTHROID) 25 MCG tablet Take 25 mcg by mouth daily. Takes tablet and 25 mcg tablet for total dose of      . losartan (COZAAR) 25 MG tablet Take 25 mg by mouth daily.      . nitroGLYCERIN (NITROSTAT) 0.4 MG SL tablet Place 0.4 mg under the tongue every 5 (five) minutes x 3 doses as needed for chest pain.      . phenytoin (DILANTIN) 100 MG ER capsule Take 100-300 mg by mouth 2 (two) times daily. 100 mg in the morning and 300 mg at night      . potassium chloride (K-DUR) 10 MEQ tablet Take 20 mEq by mouth daily.      . Tamsulosin HCl (FLOMAX) 0.4 MG CAPS Take 0.4 mg by mouth 2 (two) times daily.        Scheduled: . allopurinol  300 mg Oral Daily  . amiodarone  200 mg Oral Daily  . amLODipine  5 mg Oral Daily  .  atorvastatin  40 mg Oral BID  . carvedilol  25 mg Oral BID WC  . dipyridamole-aspirin  1 capsule Oral BID  . enoxaparin (LOVENOX) injection  60 mg Subcutaneous Q24H  . fenofibrate  160 mg Oral Daily  . furosemide  40 mg Intravenous Q12H  . glimepiride  4 mg Oral BID WC  . hydrALAZINE  50 mg Oral QID  . imipramine  50 mg Oral QHS  . insulin glargine  25 Units Subcutaneous QHS  . levETIRAcetam  500 mg Oral BID  . levothyroxine  200 mcg Oral QAC breakfast  . levothyroxine  25 mcg Oral QAC breakfast  . losartan  25 mg Oral Daily  . phenytoin  300 mg Oral QHS   And  . phenytoin  100 mg Oral Daily  . potassium chloride  20 mEq Oral Daily  . tamsulosin  0.4 mg Oral BID   Continuous:  AOZ:[id number], HYDROcodone-acetaminophen, nitroGLYCERIN  Assesment: He was admitted with acute on chronic systolic heart failure and seems to be somewhat better. He has less edema and is less short of breath but his weight and intake and output do not show significant diuresis. He has diabetes which is stable. He has COPD. He has a seizure disorder but no seizures recently. He has a history of a stroke. He does not want any invasive treatment including cardiac catheterization Principal Problem:   Acute on chronic systolic heart failure Active Problems:   HYPERTENSION   Arteriosclerotic cardiovascular disease (ASCVD)   Diabetes mellitus, type 2   Cerebrovascular disease   Chronic systolic heart failure   COPD exacerbation    Plan: Continue diuresis I will increase his diuretics    LOS: 3 days   HAWKINS,EDWARD L 07/15/2012, 8:34 AM

## 2012-07-15 NOTE — Progress Notes (Signed)
UR Chart Review Completed  

## 2012-07-16 LAB — BASIC METABOLIC PANEL
BUN: 30 mg/dL — ABNORMAL HIGH (ref 6–23)
CO2: 30 mEq/L (ref 19–32)
Chloride: 102 mEq/L (ref 96–112)
Creatinine, Ser: 1.79 mg/dL — ABNORMAL HIGH (ref 0.50–1.35)
Glucose, Bld: 152 mg/dL — ABNORMAL HIGH (ref 70–99)
Potassium: 3.9 mEq/L (ref 3.5–5.1)

## 2012-07-16 LAB — GLUCOSE, CAPILLARY

## 2012-07-16 NOTE — Progress Notes (Signed)
Pt was sitting on room air x10 minutes  O2 sat 84%  O2 ay 2.5l [place] applied  O2 saturation 98%

## 2012-07-16 NOTE — Plan of Care (Signed)
Problem: Phase II Progression Outcomes Goal: Dyspnea controlled with activity Outcome: Adequate for Discharge Mild sob with exertion

## 2012-07-16 NOTE — Progress Notes (Signed)
Subjective: He says he is better. He is less short of breath. He has less edema.  Objective: Vital signs in last 24 hours: Temp:  [97.7 F (36.5 C)-97.9 F (36.6 C)] 97.9 F (36.6 C) (03/12 0603) Pulse Rate:  [65-69] 69 (03/12 0603) Resp:  [18-20] 18 (03/12 0603) BP: (143-147)/(65-74) 143/65 mmHg (03/12 0603) SpO2:  [92 %-98 %] 92 % (03/12 0603) Weight:  [115.3 kg (254 lb 3.1 oz)] 115.3 kg (254 lb 3.1 oz) (03/12 0603) Weight change: -0.7 kg (-1 lb 8.7 oz) Last BM Date: 07/14/12  Intake/Output from previous day: 03/11 0701 - 03/12 0700 In: 720 [P.O.:720] Out: 1250 [Urine:1250]  PHYSICAL EXAM General appearance: alert, cooperative and mild distress Resp: clear to auscultation bilaterally Cardio: regular rate and rhythm, S1, S2 normal, no murmur, click, rub or gallop GI: soft, non-tender; bowel sounds normal; no masses,  no organomegaly Extremities: edema Trace  Lab Results:    Basic Metabolic Panel:  Recent Labs  21/30/86 0357 07/16/12 0451  NA 141 140  K 3.8 3.9  CL 104 102  CO2 31 30  GLUCOSE 197* 152*  BUN 31* 30*  CREATININE 1.86* 1.79*  CALCIUM 9.6 9.9   Liver Function Tests: No results found for this basename: AST, ALT, ALKPHOS, BILITOT, PROT, ALBUMIN,  in the last 72 hours No results found for this basename: LIPASE, AMYLASE,  in the last 72 hours No results found for this basename: AMMONIA,  in the last 72 hours CBC: No results found for this basename: WBC, NEUTROABS, HGB, HCT, MCV, PLT,  in the last 72 hours Cardiac Enzymes: No results found for this basename: CKTOTAL, CKMB, CKMBINDEX, TROPONINI,  in the last 72 hours BNP: No results found for this basename: PROBNP,  in the last 72 hours D-Dimer: No results found for this basename: DDIMER,  in the last 72 hours CBG:  Recent Labs  07/13/12 2042 07/14/12 2120 07/15/12 2107 07/16/12 0810  GLUCAP 282* 219* 175* 144*   Hemoglobin A1C: No results found for this basename: HGBA1C,  in the last 72  hours Fasting Lipid Panel: No results found for this basename: CHOL, HDL, LDLCALC, TRIG, CHOLHDL, LDLDIRECT,  in the last 72 hours Thyroid Function Tests: No results found for this basename: TSH, T4TOTAL, FREET4, T3FREE, THYROIDAB,  in the last 72 hours Anemia Panel: No results found for this basename: VITAMINB12, FOLATE, FERRITIN, TIBC, IRON, RETICCTPCT,  in the last 72 hours Coagulation: No results found for this basename: LABPROT, INR,  in the last 72 hours Urine Drug Screen: Drugs of Abuse  No results found for this basename: labopia, cocainscrnur, labbenz, amphetmu, thcu, labbarb    Alcohol Level: No results found for this basename: ETH,  in the last 72 hours Urinalysis: No results found for this basename: COLORURINE, APPERANCEUR, LABSPEC, PHURINE, GLUCOSEU, HGBUR, BILIRUBINUR, KETONESUR, PROTEINUR, UROBILINOGEN, NITRITE, LEUKOCYTESUR,  in the last 72 hours Misc. Labs:  ABGS No results found for this basename: PHART, PCO2, PO2ART, TCO2, HCO3,  in the last 72 hours CULTURES Recent Results (from the past 240 hour(s))  MRSA PCR SCREENING     Status: None   Collection Time    07/14/12  7:54 AM      Result Value Range Status   MRSA by PCR NEGATIVE  NEGATIVE Final   Comment:            The GeneXpert MRSA Assay (FDA     approved for NASAL specimens     only), is one component of a  comprehensive MRSA colonization     surveillance program. It is not     intended to diagnose MRSA     infection nor to guide or     monitor treatment for     MRSA infections.   Studies/Results: No results found.  Medications:  Prior to Admission:  Prescriptions prior to admission  Medication Sig Dispense Refill  . allopurinol (ZYLOPRIM) 300 MG tablet Take 300 mg by mouth daily.      Marland Kitchen ALPRAZolam (XANAX) 0.5 MG tablet Take 0.5 mg by mouth 3 (three) times daily as needed. For anxiety      . amiodarone (PACERONE) 200 MG tablet Take 1 tablet (200 mg total) by mouth daily.  60 tablet  6  .  amLODipine (NORVASC) 5 MG tablet Take 5 mg by mouth daily.      Marland Kitchen atorvastatin (LIPITOR) 80 MG tablet Take 40 mg by mouth 2 (two) times daily.      . carvedilol (COREG) 25 MG tablet Take 25 mg by mouth 2 (two) times daily with a meal.      . dipyridamole-aspirin (AGGRENOX) 25-200 MG per 12 hr capsule Take 1 capsule by mouth 2 (two) times daily.      . fenofibrate 160 MG tablet Take 160 mg by mouth daily.      . furosemide (LASIX) 40 MG tablet Take 40 mg by mouth daily. May take twice daily if needed      . glimepiride (AMARYL) 4 MG tablet Take 4 mg by mouth 2 (two) times daily with a meal.       . hydrALAZINE (APRESOLINE) 50 MG tablet Take 50 mg by mouth 4 (four) times daily.      Marland Kitchen HYDROcodone-acetaminophen (VICODIN) 5-500 MG per tablet Take 1 tablet by mouth every 6 (six) hours as needed. For pain      . imipramine (TOFRANIL) 25 MG tablet Take 50 mg by mouth at bedtime.      . insulin glargine (LANTUS) 100 UNIT/ML injection Inject 25 Units into the skin at bedtime.      . levETIRAcetam (KEPPRA) 500 MG tablet Take 500 mg by mouth 2 (two) times daily.      Marland Kitchen levothyroxine (SYNTHROID, LEVOTHROID) 200 MCG tablet Take 200 mcg by mouth daily. Takes tablet and 25 mcg tablet for total dose of      . levothyroxine (SYNTHROID, LEVOTHROID) 25 MCG tablet Take 25 mcg by mouth daily. Takes tablet and 25 mcg tablet for total dose of      . losartan (COZAAR) 25 MG tablet Take 25 mg by mouth daily.      . nitroGLYCERIN (NITROSTAT) 0.4 MG SL tablet Place 0.4 mg under the tongue every 5 (five) minutes x 3 doses as needed for chest pain.      . phenytoin (DILANTIN) 100 MG ER capsule Take 100-300 mg by mouth 2 (two) times daily. 100 mg in the morning and 300 mg at night      . potassium chloride (K-DUR) 10 MEQ tablet Take 20 mEq by mouth daily.      . Tamsulosin HCl (FLOMAX) 0.4 MG CAPS Take 0.4 mg by mouth 2 (two) times daily.       Scheduled: . allopurinol  300 mg Oral Daily  .  amiodarone  200 mg Oral Daily  . amLODipine  5 mg Oral Daily  . atorvastatin  40 mg Oral BID  . carvedilol  25 mg Oral BID WC  . dipyridamole-aspirin  1 capsule Oral BID  . enoxaparin (LOVENOX) injection  60 mg Subcutaneous Q24H  . fenofibrate  160 mg Oral Daily  . furosemide  80 mg Intravenous Q12H  . glimepiride  4 mg Oral BID WC  . hydrALAZINE  50 mg Oral QID  . imipramine  50 mg Oral QHS  . insulin glargine  25 Units Subcutaneous QHS  . levETIRAcetam  500 mg Oral BID  . levothyroxine  200 mcg Oral QAC breakfast  . levothyroxine  25 mcg Oral QAC breakfast  . losartan  25 mg Oral Daily  . phenytoin  300 mg Oral QHS   And  . phenytoin  100 mg Oral Daily  . potassium chloride  20 mEq Oral Daily  . spironolactone  12.5 mg Oral Daily  . tamsulosin  0.4 mg Oral BID   Continuous:  WUJ:[id number], HYDROcodone-acetaminophen, nitroGLYCERIN  Assesment: He was admitted with acute on chronic systolic heart failure and is improving. He has less edema. He has multiple other medical problems including widespread vascular disease. He is diabetic and has been pretty well controlled. He has had a previous stroke and a seizure disorder related to that but no seizures recently. He looks considerably better than yesterday Principal Problem:   Acute on chronic systolic heart failure Active Problems:   HYPERTENSION   Arteriosclerotic cardiovascular disease (ASCVD)   Diabetes mellitus, type 2   Cerebrovascular disease   Chronic systolic heart failure   COPD exacerbation    Plan: Continue with current medications. I think he will probably be able to be discharged tomorrow    LOS: 4 days   HAWKINS,EDWARD L 07/16/2012, 8:44 AM

## 2012-07-17 LAB — BASIC METABOLIC PANEL
CO2: 31 mEq/L (ref 19–32)
Chloride: 102 mEq/L (ref 96–112)
Creatinine, Ser: 1.73 mg/dL — ABNORMAL HIGH (ref 0.50–1.35)
GFR calc Af Amer: 42 mL/min — ABNORMAL LOW (ref >90)
Potassium: 3.7 mEq/L (ref 3.5–5.1)
Sodium: 141 mEq/L (ref 135–145)

## 2012-07-17 LAB — GLUCOSE, CAPILLARY
Glucose-Capillary: 146 mg/dL — ABNORMAL HIGH (ref 70–99)
Glucose-Capillary: 109 mg/dL — ABNORMAL HIGH (ref 70–99)
Glucose-Capillary: 144 mg/dL — ABNORMAL HIGH (ref 70–99)

## 2012-07-17 MED ORDER — SPIRONOLACTONE 12.5 MG HALF TABLET: 12.5000 mg | ORAL_TABLET | Freq: Every day | ORAL | Status: DC

## 2012-07-17 NOTE — Progress Notes (Signed)
Pt will need to wear O2 @2l /Cogswell continuous for O2 sats 87% on RA.

## 2012-07-17 NOTE — Progress Notes (Signed)
Subjective: He says he feels better. He wants to go home. He has no new complaints.  Objective: Vital signs in last 24 hours: Temp:  [97.4 F (36.3 C)-98 F (36.7 C)] 97.4 F (36.3 C) (03/13 0403) Pulse Rate:  [59-68] 68 (03/13 0403) Resp:  [18] 18 (03/13 0403) BP: (115-131)/(47-54) 125/50 mmHg (03/13 0403) SpO2:  [84 %-99 %] 91 % (03/13 0403) Weight:  [116.8 kg (257 lb 8 oz)] 116.8 kg (257 lb 8 oz) (03/13 0403) Weight change: 1.5 kg (3 lb 4.9 oz) Last BM Date: 07/14/12  Intake/Output from previous day: 03/12 0701 - 03/13 0700 In: 600 [P.O.:600] Out: 900 [Urine:900]  PHYSICAL EXAM General appearance: alert, cooperative, no distress and moderately obese Resp: clear to auscultation bilaterally Cardio: regular rate and rhythm, S1, S2 normal, no murmur, click, rub or gallop GI: soft, non-tender; bowel sounds normal; no masses,  no organomegaly Extremities: venous stasis dermatitis noted  Lab Results:    Basic Metabolic Panel:  Recent Labs  16/10/96 0451 07/17/12 0648  NA 140 141  K 3.9 3.7  CL 102 102  CO2 30 31  GLUCOSE 152* 108*  BUN 30* 27*  CREATININE 1.79* 1.73*  CALCIUM 9.9 9.7   Liver Function Tests: No results found for this basename: AST, ALT, ALKPHOS, BILITOT, PROT, ALBUMIN,  in the last 72 hours No results found for this basename: LIPASE, AMYLASE,  in the last 72 hours No results found for this basename: AMMONIA,  in the last 72 hours CBC: No results found for this basename: WBC, NEUTROABS, HGB, HCT, MCV, PLT,  in the last 72 hours Cardiac Enzymes: No results found for this basename: CKTOTAL, CKMB, CKMBINDEX, TROPONINI,  in the last 72 hours BNP: No results found for this basename: PROBNP,  in the last 72 hours D-Dimer: No results found for this basename: DDIMER,  in the last 72 hours CBG:  Recent Labs  07/14/12 2120 07/15/12 2107 07/16/12 0810 07/16/12 2239 07/17/12 0807  GLUCAP 219* 175* 144* 146* 109*   Hemoglobin A1C: No results found  for this basename: HGBA1C,  in the last 72 hours Fasting Lipid Panel: No results found for this basename: CHOL, HDL, LDLCALC, TRIG, CHOLHDL, LDLDIRECT,  in the last 72 hours Thyroid Function Tests: No results found for this basename: TSH, T4TOTAL, FREET4, T3FREE, THYROIDAB,  in the last 72 hours Anemia Panel: No results found for this basename: VITAMINB12, FOLATE, FERRITIN, TIBC, IRON, RETICCTPCT,  in the last 72 hours Coagulation: No results found for this basename: LABPROT, INR,  in the last 72 hours Urine Drug Screen: Drugs of Abuse  No results found for this basename: labopia, cocainscrnur, labbenz, amphetmu, thcu, labbarb    Alcohol Level: No results found for this basename: ETH,  in the last 72 hours Urinalysis: No results found for this basename: COLORURINE, APPERANCEUR, LABSPEC, PHURINE, GLUCOSEU, HGBUR, BILIRUBINUR, KETONESUR, PROTEINUR, UROBILINOGEN, NITRITE, LEUKOCYTESUR,  in the last 72 hours Misc. Labs:  ABGS No results found for this basename: PHART, PCO2, PO2ART, TCO2, HCO3,  in the last 72 hours CULTURES Recent Results (from the past 240 hour(s))  MRSA PCR SCREENING     Status: None   Collection Time    07/14/12  7:54 AM      Result Value Range Status   MRSA by PCR NEGATIVE  NEGATIVE Final   Comment:            The GeneXpert MRSA Assay (FDA     approved for NASAL specimens     only),  is one component of a     comprehensive MRSA colonization     surveillance program. It is not     intended to diagnose MRSA     infection nor to guide or     monitor treatment for     MRSA infections.   Studies/Results: No results found.  Medications:  Prior to Admission:  Prescriptions prior to admission  Medication Sig Dispense Refill  . allopurinol (ZYLOPRIM) 300 MG tablet Take 300 mg by mouth daily.      Marland Kitchen ALPRAZolam (XANAX) 0.5 MG tablet Take 0.5 mg by mouth 3 (three) times daily as needed. For anxiety      . amiodarone (PACERONE) 200 MG tablet Take 1 tablet (200 mg  total) by mouth daily.  60 tablet  6  . amLODipine (NORVASC) 5 MG tablet Take 5 mg by mouth daily.      Marland Kitchen atorvastatin (LIPITOR) 80 MG tablet Take 40 mg by mouth 2 (two) times daily.      . carvedilol (COREG) 25 MG tablet Take 25 mg by mouth 2 (two) times daily with a meal.      . dipyridamole-aspirin (AGGRENOX) 25-200 MG per 12 hr capsule Take 1 capsule by mouth 2 (two) times daily.      . fenofibrate 160 MG tablet Take 160 mg by mouth daily.      . furosemide (LASIX) 40 MG tablet Take 40 mg by mouth daily. May take twice daily if needed      . glimepiride (AMARYL) 4 MG tablet Take 4 mg by mouth 2 (two) times daily with a meal.       . hydrALAZINE (APRESOLINE) 50 MG tablet Take 50 mg by mouth 4 (four) times daily.      Marland Kitchen HYDROcodone-acetaminophen (VICODIN) 5-500 MG per tablet Take 1 tablet by mouth every 6 (six) hours as needed. For pain      . imipramine (TOFRANIL) 25 MG tablet Take 50 mg by mouth at bedtime.      . insulin glargine (LANTUS) 100 UNIT/ML injection Inject 25 Units into the skin at bedtime.      . levETIRAcetam (KEPPRA) 500 MG tablet Take 500 mg by mouth 2 (two) times daily.      Marland Kitchen levothyroxine (SYNTHROID, LEVOTHROID) 200 MCG tablet Take 200 mcg by mouth daily. Takes tablet and 25 mcg tablet for total dose of      . levothyroxine (SYNTHROID, LEVOTHROID) 25 MCG tablet Take 25 mcg by mouth daily. Takes tablet and 25 mcg tablet for total dose of      . losartan (COZAAR) 25 MG tablet Take 25 mg by mouth daily.      . nitroGLYCERIN (NITROSTAT) 0.4 MG SL tablet Place 0.4 mg under the tongue every 5 (five) minutes x 3 doses as needed for chest pain.      . phenytoin (DILANTIN) 100 MG ER capsule Take 100-300 mg by mouth 2 (two) times daily. 100 mg in the morning and 300 mg at night      . potassium chloride (K-DUR) 10 MEQ tablet Take 20 mEq by mouth daily.      . Tamsulosin HCl (FLOMAX) 0.4 MG CAPS Take 0.4 mg by mouth 2 (two) times daily.       Scheduled: .  allopurinol  300 mg Oral Daily  . amiodarone  200 mg Oral Daily  . amLODipine  5 mg Oral Daily  . atorvastatin  40 mg Oral BID  . carvedilol  25 mg Oral BID WC  . dipyridamole-aspirin  1 capsule Oral BID  . enoxaparin (LOVENOX) injection  60 mg Subcutaneous Q24H  . fenofibrate  160 mg Oral Daily  . furosemide  80 mg Intravenous Q12H  . glimepiride  4 mg Oral BID WC  . hydrALAZINE  50 mg Oral QID  . imipramine  50 mg Oral QHS  . insulin glargine  25 Units Subcutaneous QHS  . levETIRAcetam  500 mg Oral BID  . levothyroxine  200 mcg Oral QAC breakfast  . levothyroxine  25 mcg Oral QAC breakfast  . losartan  25 mg Oral Daily  . phenytoin  300 mg Oral QHS   And  . phenytoin  100 mg Oral Daily  . potassium chloride  20 mEq Oral Daily  . spironolactone  12.5 mg Oral Daily  . tamsulosin  0.4 mg Oral BID   Continuous:  ZOX:[id number], HYDROcodone-acetaminophen, nitroGLYCERIN  Assesment: He was admitted with acute on chronic systolic heart failure in he is better. His renal function is holding. I think he is ready for discharge Principal Problem:   Acute on chronic systolic heart failure Active Problems:   HYPERTENSION   Arteriosclerotic cardiovascular disease (ASCVD)   Diabetes mellitus, type 2   Cerebrovascular disease   Chronic systolic heart failure   COPD exacerbation    Plan: Discharge home today    LOS: 5 days   HAWKINS,EDWARD L 07/17/2012, 8:46 AM

## 2012-07-17 NOTE — Plan of Care (Signed)
Problem: Discharge Progression Outcomes Goal: Barriers To Progression Addressed/Resolved Outcome: Adequate for Discharge Pt will have home O2 Goal: Able to perform self care activities Outcome: Completed/Met Date Met:  07/17/12 At baseline Goal: Activity appropriate for discharge plan Outcome: Completed/Met Date Met:  07/17/12 At baseline Goal: Other Discharge Outcomes/Goals Outcome: Completed/Met Date Met:  07/17/12 Discharge instructions read to pt and his daughter  Discharged to home with daughter on home O2

## 2012-07-17 NOTE — Progress Notes (Signed)
Pt on room air O2 sat 87%  O2 2l  applied

## 2012-07-20 NOTE — Discharge Summary (Signed)
Physician Discharge Summary  Patient ID: Donald Owen MRN: [id number] DOB/AGE: 1934/01/04 79 y.o. Primary Care Physician:Angell Pincock L, MD Admit date: 07/12/2012 Discharge date: 07/20/2012    Discharge Diagnoses:   Principal Problem:   Acute on chronic systolic heart failure Active Problems:   HYPERTENSION   Arteriosclerotic cardiovascular disease (ASCVD)   Diabetes mellitus, type 2   Cerebrovascular disease   Chronic systolic heart failure   COPD exacerbation  seizure disorder Anxiety    Medication List    TAKE these medications       AGGRENOX 200-25 MG per 12 hr capsule  Generic drug:  dipyridamole-aspirin  Take 1 capsule by mouth 2 (two) times daily.     allopurinol 300 MG tablet  Commonly known as:  ZYLOPRIM  Take 300 mg by mouth daily.     ALPRAZolam 0.5 MG tablet  Commonly known as:  XANAX  Take 0.5 mg by mouth 3 (three) times daily as needed. For anxiety     amiodarone 200 MG tablet  Commonly known as:  PACERONE  Take 1 tablet (200 mg total) by mouth daily.     amLODipine 5 MG tablet  Commonly known as:  NORVASC  Take 5 mg by mouth daily.     atorvastatin 80 MG tablet  Commonly known as:  LIPITOR  Take 40 mg by mouth 2 (two) times daily.     carvedilol 25 MG tablet  Commonly known as:  COREG  Take 25 mg by mouth 2 (two) times daily with a meal.     fenofibrate 160 MG tablet  Take 160 mg by mouth daily.     furosemide 40 MG tablet  Commonly known as:  LASIX  Take 40 mg by mouth daily. May take twice daily if needed     glimepiride 4 MG tablet  Commonly known as:  AMARYL  Take 4 mg by mouth 2 (two) times daily with a meal.     hydrALAZINE 50 MG tablet  Commonly known as:  APRESOLINE  Take 50 mg by mouth 4 (four) times daily.     HYDROcodone-acetaminophen 5-500 MG per tablet  Commonly known as:  VICODIN  Take 1 tablet by mouth every 6 (six) hours as needed. For pain     imipramine 25 MG tablet  Commonly known as:  TOFRANIL  Take 50 mg  by mouth at bedtime.     insulin glargine 100 UNIT/ML injection  Commonly known as:  LANTUS  Inject 25 Units into the skin at bedtime.     levETIRAcetam 500 MG tablet  Commonly known as:  KEPPRA  Take 500 mg by mouth 2 (two) times daily.     levothyroxine 200 MCG tablet  Commonly known as:  SYNTHROID, LEVOTHROID  Take 200 mcg by mouth daily. Takes tablet and 25 mcg tablet for total dose of     levothyroxine 25 MCG tablet  Commonly known as:  SYNTHROID, LEVOTHROID  Take 25 mcg by mouth daily. Takes tablet and 25 mcg tablet for total dose of     losartan 25 MG tablet  Commonly known as:  COZAAR  Take 25 mg by mouth daily.     nitroGLYCERIN 0.4 MG SL tablet  Commonly known as:  NITROSTAT  Place 0.4 mg under the tongue every 5 (five) minutes x 3 doses as needed for chest pain.     phenytoin 100 MG ER capsule  Commonly known as:  DILANTIN  Take 100-300 mg by mouth 2 (  two) times daily. 100 mg in the morning and 300 mg at night     potassium chloride 10 MEQ tablet  Commonly known as:  K-DUR  Take 20 mEq by mouth daily.     spironolactone 12.5 mg Tabs  Commonly known as:  ALDACTONE  Take 0.5 tablets (12.5 mg total) by mouth daily.     tamsulosin 0.4 MG Caps  Commonly known as:  FLOMAX  Take 0.4 mg by mouth 2 (two) times daily.        Discharged Condition: Improved    Consults: None  Significant Diagnostic Studies: Dg Chest Portable 1 View  07/12/2012  *RADIOLOGY REPORT*  Clinical Data: Shortness of breath  PORTABLE CHEST - 1 VIEW  Comparison: 10/02/2011  Findings: Cardiomegaly with pulmonary vascular congestion.  No frank interstitial edema.  No pleural effusion or pneumothorax.  Postsurgical changes related to prior CABG.  IMPRESSION: Cardiomegaly with pulmonary vascular congestion.  No frank interstitial edema.   Original Report Authenticated By: Charline Bills, M.D.     Lab Results: Basic Metabolic Panel: No results found for this  basename: NA, K, CL, CO2, GLUCOSE, BUN, CREATININE, CALCIUM, MG, PHOS,  in the last 72 hours Liver Function Tests: No results found for this basename: AST, ALT, ALKPHOS, BILITOT, PROT, ALBUMIN,  in the last 72 hours   CBC: No results found for this basename: WBC, NEUTROABS, HGB, HCT, MCV, PLT,  in the last 72 hours  Recent Results (from the past 240 hour(s))  MRSA PCR SCREENING     Status: None   Collection Time    07/14/12  7:54 AM      Result Value Range Status   MRSA by PCR NEGATIVE  NEGATIVE Final   Comment:            The GeneXpert MRSA Assay (FDA     approved for NASAL specimens     only), is one component of a     comprehensive MRSA colonization     surveillance program. It is not     intended to diagnose MRSA     infection nor to guide or     monitor treatment for     MRSA infections.     Hospital Course: He was admitted with increasing shortness of breath and evidence of congestive heart failure. He has a long known history of cardiac issues. He has declined invasive evaluation for the last year or so. He did not have any chest pain. He was admitted and started on intravenous diuresis and improved. His leg edema was much better. He was able to lie flat. He felt like he was back to baseline at the time of discharge  Discharge Exam: Blood pressure 114/59, pulse 68, temperature 97.4 F (36.3 C), temperature source Oral, resp. rate 18, height 5\' 10"  (1.778 m), weight 116.8 kg (257 lb 8 oz), SpO2 91.00%. He is awake and alert. His chest is much clearer. His heart is regular. He has only trace if any edema now.  Disposition: Home with home health services      Discharge Orders   Future Orders Complete By Expires     Discharge patient  As directed     Face-to-face encounter (required for Medicare/Medicaid patients)  As directed     Comments:      I Donald Owen L certify that this patient is under my care and that I, or a nurse practitioner or physician's assistant  working with me, had a face-to-face encounter that meets the physician  face-to-face encounter requirements with this patient on 07/17/2012. The encounter with the patient was in whole, or in part for the following medical condition(s) which is the primary reason for home health care (List medical condition): CHF    Questions:      The encounter with the patient was in whole, or in part, for the following medical condition, which is the primary reason for home health care:  chf    I certify that, based on my findings, the following services are medically necessary home health services:  Nursing    Physical therapy    My clinical findings support the need for the above services:  Shortness of breath with activity    Further, I certify that my clinical findings support that this patient is homebound due to:  Shortness of Breath with activity    Reason for Medically Necessary Home Health Services:  Skilled Nursing- Change/Decline in Patient Status    Home Health  As directed     Questions:      To provide the following care/treatments:  PT    RN       Follow-up Information   Follow up with [facility].   Contact information:   [address] [place] [address] [phone]      Signed: Fredirick Maudlin Pager [phone]  07/20/2012, 9:28 AM

## 2012-07-22 NOTE — Progress Notes (Signed)
UR Chart Review Completed  

## 2012-08-10 IMAGING — CR DG CHEST 2V
3 series · 3 of 3 positions shown · non-contrast
Comparison: 05/30/2010.  11/08/2003.

CLINICAL DATA: Cough.  Chest pain.

CHEST - 2 VIEW

[view not recorded (1 of 3)]
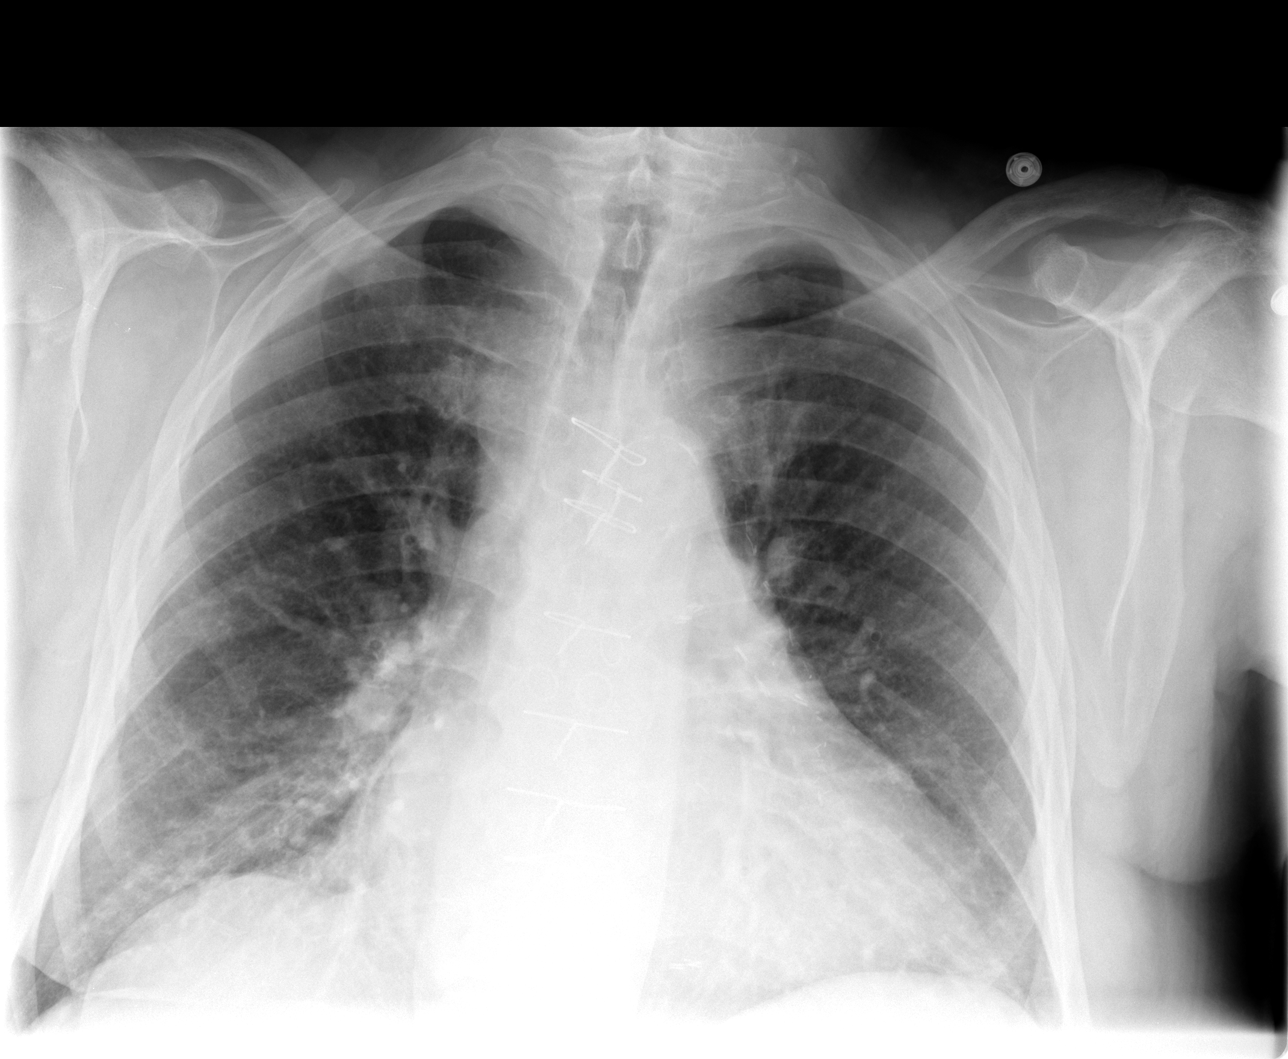

[view not recorded (2 of 3)]
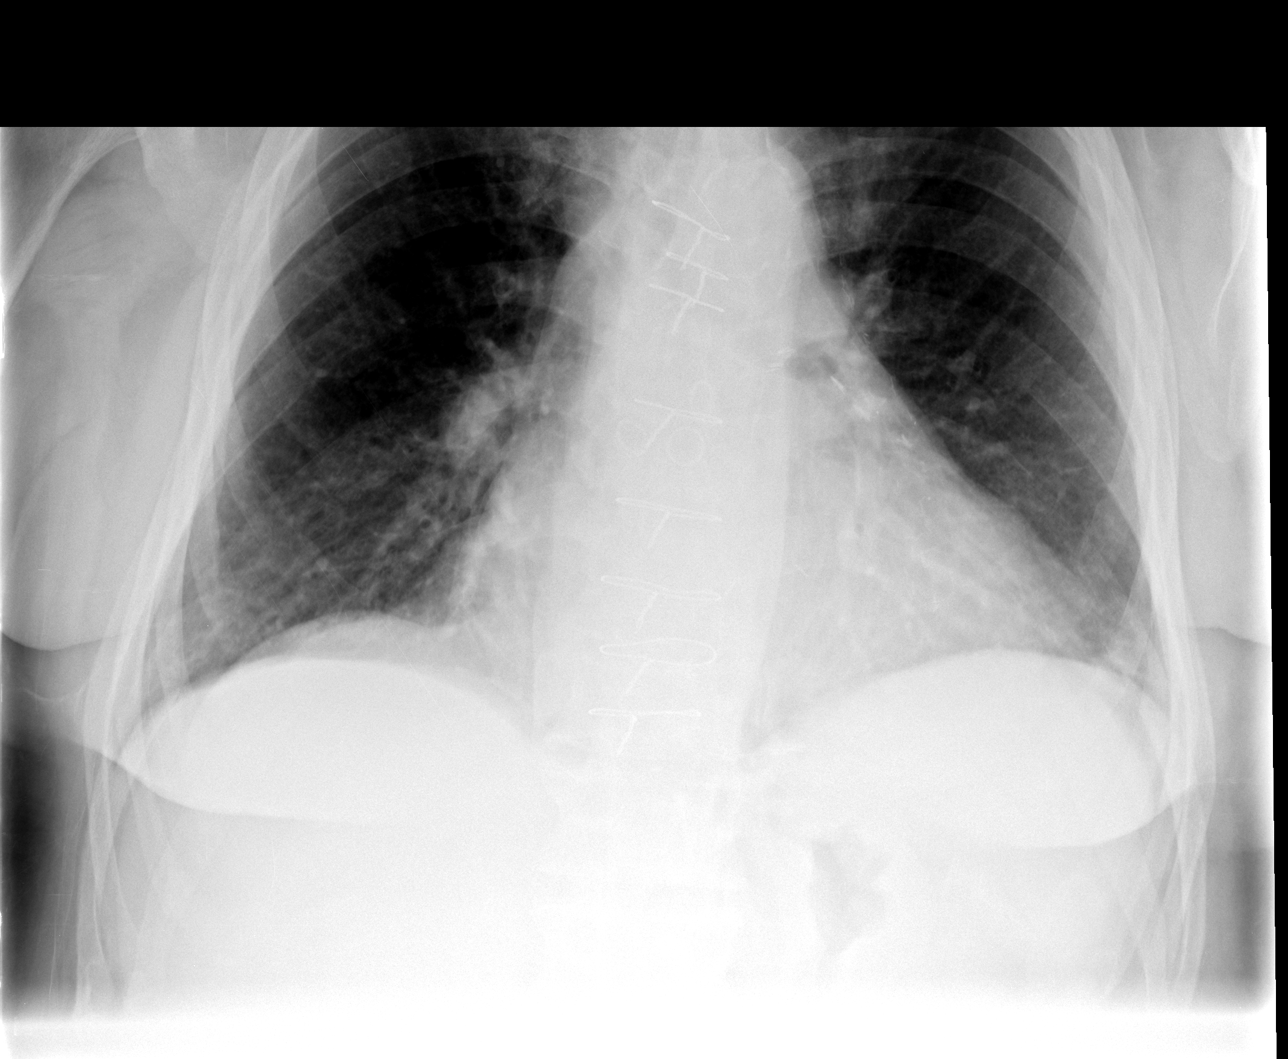

[view not recorded (3 of 3)]
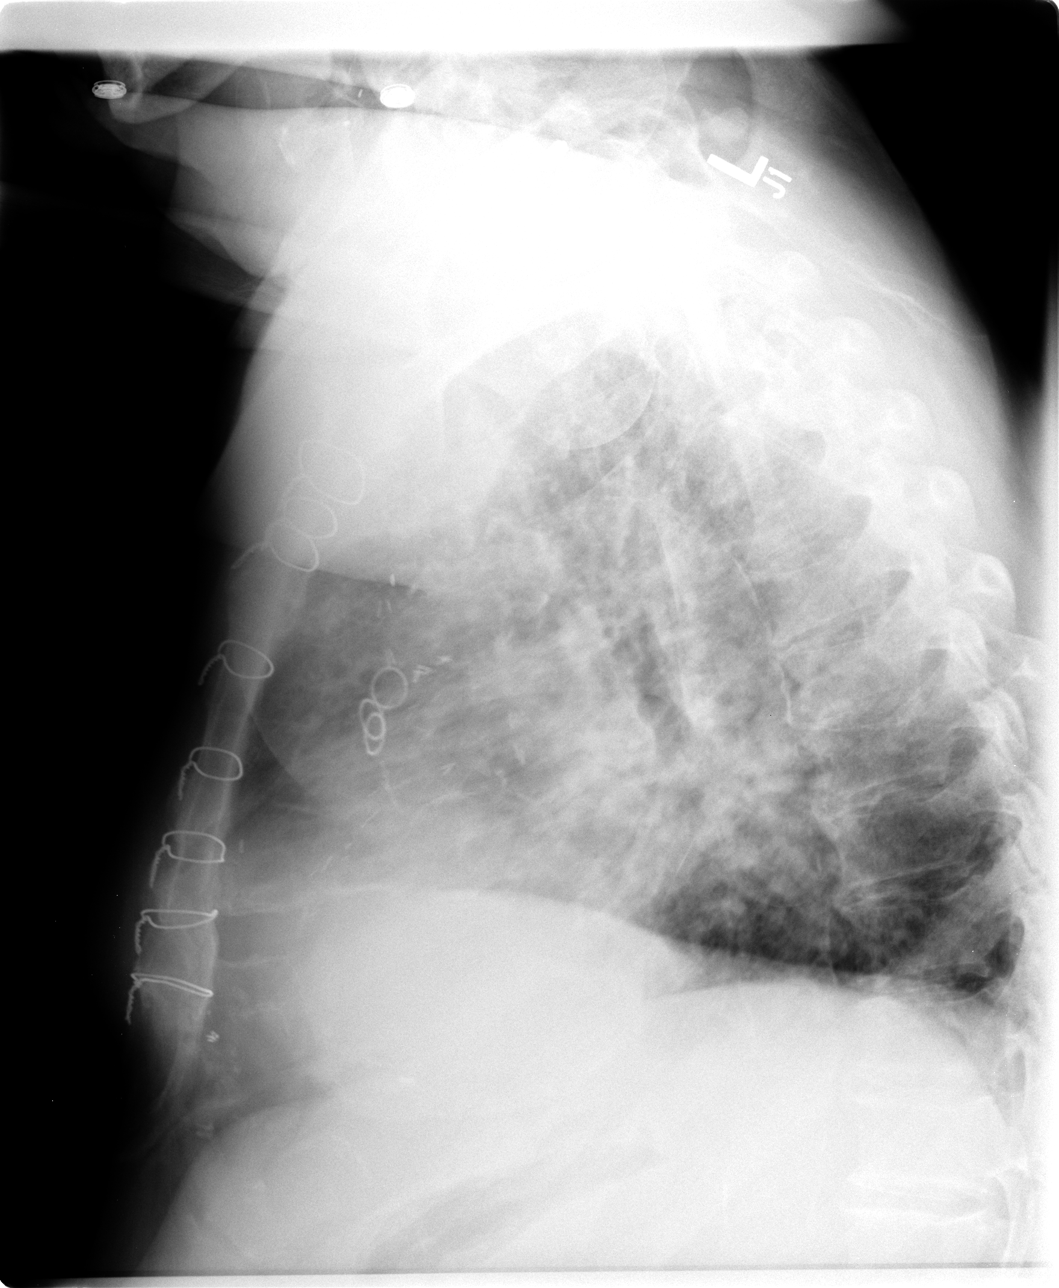

[3 of 3 positions shown; findings below may reference images not displayed]

FINDINGS: There is been previous median sternotomy and CABG.
Cardiac silhouette remains enlarged.  I think there is pulmonary
venous hypertension but there is no frank edema.  There may be
bronchial thickening but there is no infiltrate, collapse or
effusion.  No acute bony finding.
IMPRESSION: Previous CABG.  Cardiomegaly.

Suspicion of pulmonary venous hypertension without frank edema.

Bronchial thickening without consolidation or collapse.

## 2012-08-12 ENCOUNTER — Encounter (HOSPITAL_COMMUNITY): Payer: Self-pay | Admitting: *Deleted

## 2012-08-12 ENCOUNTER — Emergency Department (HOSPITAL_COMMUNITY): Payer: Medicare Other

## 2012-08-12 ENCOUNTER — Emergency Department (HOSPITAL_COMMUNITY)
Admission: EM | Admit: 2012-08-12 | Discharge: 2012-08-12 | DRG: 293 | Discharge status: Against Medical Advice | Payer: Medicare Other | Attending: Emergency Medicine | Admitting: Emergency Medicine

## 2012-08-12 DIAGNOSIS — E785 Hyperlipidemia, unspecified: Secondary | ICD-10-CM | POA: Diagnosis present

## 2012-08-12 DIAGNOSIS — Z87891 Personal history of nicotine dependence: Secondary | ICD-10-CM

## 2012-08-12 DIAGNOSIS — I509 Heart failure, unspecified: Secondary | ICD-10-CM | POA: Diagnosis present

## 2012-08-12 DIAGNOSIS — R079 Chest pain, unspecified: Secondary | ICD-10-CM

## 2012-08-12 DIAGNOSIS — Z8673 Personal history of transient ischemic attack (TIA), and cerebral infarction without residual deficits: Secondary | ICD-10-CM

## 2012-08-12 DIAGNOSIS — Z8249 Family history of ischemic heart disease and other diseases of the circulatory system: Secondary | ICD-10-CM

## 2012-08-12 DIAGNOSIS — I2510 Atherosclerotic heart disease of native coronary artery without angina pectoris: Secondary | ICD-10-CM | POA: Diagnosis present

## 2012-08-12 DIAGNOSIS — I10 Essential (primary) hypertension: Secondary | ICD-10-CM | POA: Diagnosis present

## 2012-08-12 DIAGNOSIS — Z79899 Other long term (current) drug therapy: Secondary | ICD-10-CM

## 2012-08-12 DIAGNOSIS — M109 Gout, unspecified: Secondary | ICD-10-CM | POA: Diagnosis present

## 2012-08-12 DIAGNOSIS — I252 Old myocardial infarction: Secondary | ICD-10-CM

## 2012-08-12 DIAGNOSIS — Z794 Long term (current) use of insulin: Secondary | ICD-10-CM

## 2012-08-12 DIAGNOSIS — Z951 Presence of aortocoronary bypass graft: Secondary | ICD-10-CM

## 2012-08-12 DIAGNOSIS — E119 Type 2 diabetes mellitus without complications: Secondary | ICD-10-CM | POA: Diagnosis present

## 2012-08-12 DIAGNOSIS — E039 Hypothyroidism, unspecified: Secondary | ICD-10-CM | POA: Diagnosis present

## 2012-08-12 LAB — COMPREHENSIVE METABOLIC PANEL
ALT: 13 U/L (ref 0–53)
AST: 17 U/L (ref 0–37)
Alkaline Phosphatase: 27 U/L — ABNORMAL LOW (ref 39–117)
CO2: 26 mEq/L (ref 19–32)
Calcium: 9.8 mg/dL (ref 8.4–10.5)
Potassium: 4.1 mEq/L (ref 3.5–5.1)
Sodium: 138 mEq/L (ref 135–145)
Total Protein: 7.4 g/dL (ref 6.0–8.3)

## 2012-08-12 LAB — CBC WITH DIFFERENTIAL/PLATELET
Basophils Absolute: 0 10*3/uL (ref 0.0–0.1)
Eosinophils Absolute: 0.1 10*3/uL (ref 0.0–0.7)
Eosinophils Relative: 2 % (ref 0–5)
Lymphocytes Relative: 11 % — ABNORMAL LOW (ref 12–46)
MCV: 94.3 fL (ref 78.0–100.0)
Neutrophils Relative %: 77 % (ref 43–77)
Platelets: 174 10*3/uL (ref 150–400)
RBC: 4.03 MIL/uL — ABNORMAL LOW (ref 4.22–5.81)
RDW: 16.3 % — ABNORMAL HIGH (ref 11.5–15.5)
WBC: 7.3 10*3/uL (ref 4.0–10.5)

## 2012-08-12 MED ORDER — FUROSEMIDE 10 MG/ML IJ SOLN
40.0000 mg | Freq: Once | INTRAMUSCULAR | Status: AC
  Administered 2012-08-12: 40 mg via INTRAVENOUS
  Filled 2012-08-12: qty 4

## 2012-08-12 NOTE — ED Notes (Signed)
Chest pain for 2-3 days, seen by Dr Juanetta Gosling today for check up after adm for CHF.  NO sob.  Pain lt chest and down into abd.    No NVD

## 2012-08-12 NOTE — ED Notes (Addendum)
Family refused admission.  States "If something happens to him, then it's on him."  Per family, pt did see PCP today and "was told he was fine."  Dr. Judd Lien made aware.

## 2012-08-12 NOTE — ED Notes (Signed)
Dr. Judd Lien aware, and pt to sign out AMA

## 2012-08-12 NOTE — ED Notes (Signed)
Pt back for xray

## 2012-08-12 NOTE — ED Notes (Signed)
PT c/o pain in left abd radiating up into chest for the past 2 or 3 days.  Denies any n/v/d.   Dr. Judd Lien at bedside, see edp's assessment.

## 2012-08-12 NOTE — ED Provider Notes (Addendum)
History     CSN: [id number]  Arrival date & time 08/12/12  1559   First MD Initiated Contact with Patient 08/12/12 1610      Chief Complaint  Patient presents with  . Chest Pain    (Consider location/radiation/quality/duration/timing/severity/associated sxs/prior treatment) HPI Comments: Patient with history of CAD, CABG, CHF.  Recently admitted for CHF exacerbation and seen by Dr. Juanetta Gosling this morning for follow up.  He tells me he has been having discomfort in the left side of his chest for the past three days, but he did not mention this to Dr. Juanetta Gosling.  It appears as though he may have some dementia and history is difficult.  He is here with his daughter who was able to fill in some gaps in the history.    Patient is a 79 y.o. male presenting with chest pain. The history is provided by the patient.  Chest Pain Pain location:  L chest Pain quality: pressure   Pain radiates to:  Does not radiate Pain radiates to the back: no   Pain severity:  Moderate Onset quality:  Gradual Duration:  3 days Timing:  Constant Progression:  Worsening Chronicity:  Recurrent Context: not breathing and no movement   Relieved by:  Nothing Worsened by:  Nothing tried Ineffective treatments:  None tried Associated symptoms: fatigue   Associated symptoms: no abdominal pain and no fever     Past Medical History  Diagnosis Date  . Arteriosclerotic cardiovascular disease (ASCVD) 2000    CABG surgery in 2000 following acute MI; moderate LV dysfunction; EF of 45-50% by echocardiogram in 2005; 05/2010-admitted with CHF and hypertension, EF of 45% by echo, patient refused cardiac catheterization  . Gout   . Hypothyroidism   . Seizures   . Dysrhythmia     wide complex tachycardia  . Diabetes mellitus, type 2     insulin dependent  . Cerebrovascular disease 2004    Left cerebral CVA followed by carotid endarterectomy in [place]  . Hypertension   . Gastritis 2004    with gastric erosions  .  Hyperlipidemia   . CHF (congestive heart failure)     Past Surgical History  Procedure Laterality Date  . Coronary artery bypass graft  04/1999    Dr. Barry Dienes  . Carotid endarterectomy    . Cholecystectomy    . Cardiac surgery      Family History  Problem Relation Age of Onset  . Heart disease Father     History  Substance Use Topics  . Smoking status: Former Smoker -- 1.50 packs/day for 40 years    Quit date: 04/08/1999  . Smokeless tobacco: Never Used     Comment: quit 1999  . Alcohol Use: No      Review of Systems  Constitutional: Positive for fatigue. Negative for fever.  Cardiovascular: Positive for chest pain.  Gastrointestinal: Negative for abdominal pain.  All other systems reviewed and are negative.    Allergies  Review of patient's allergies indicates no known allergies.  Home Medications   Current Outpatient Rx  Name  Route  Sig  Dispense  Refill  . allopurinol (ZYLOPRIM) 300 MG tablet   Oral   Take 300 mg by mouth daily.         Marland Kitchen ALPRAZolam (XANAX) 0.5 MG tablet   Oral   Take 0.5 mg by mouth 3 (three) times daily as needed. For anxiety         . amiodarone (PACERONE) 200 MG tablet  Oral   Take 1 tablet (200 mg total) by mouth daily.   60 tablet   6   . amLODipine (NORVASC) 5 MG tablet   Oral   Take 5 mg by mouth daily.         Marland Kitchen atorvastatin (LIPITOR) 80 MG tablet   Oral   Take 40 mg by mouth 2 (two) times daily.         . carvedilol (COREG) 25 MG tablet   Oral   Take 25 mg by mouth 2 (two) times daily with a meal.         . dipyridamole-aspirin (AGGRENOX) 25-200 MG per 12 hr capsule   Oral   Take 1 capsule by mouth 2 (two) times daily.         . fenofibrate 160 MG tablet   Oral   Take 160 mg by mouth daily.         . furosemide (LASIX) 40 MG tablet   Oral   Take 40 mg by mouth daily. May take twice daily if needed         . glimepiride (AMARYL) 4 MG tablet   Oral   Take 4 mg by mouth 2 (two) times daily  with a meal.          . hydrALAZINE (APRESOLINE) 50 MG tablet   Oral   Take 50 mg by mouth 4 (four) times daily.         Marland Kitchen HYDROcodone-acetaminophen (VICODIN) 5-500 MG per tablet   Oral   Take 1 tablet by mouth every 6 (six) hours as needed. For pain         . imipramine (TOFRANIL) 25 MG tablet   Oral   Take 50 mg by mouth at bedtime.         . insulin glargine (LANTUS) 100 UNIT/ML injection   Subcutaneous   Inject 25 Units into the skin at bedtime.         . levETIRAcetam (KEPPRA) 500 MG tablet   Oral   Take 500 mg by mouth 2 (two) times daily.         Marland Kitchen levothyroxine (SYNTHROID, LEVOTHROID) 200 MCG tablet   Oral   Take 200 mcg by mouth daily. Takes tablet and 25 mcg tablet for total dose of         . levothyroxine (SYNTHROID, LEVOTHROID) 25 MCG tablet   Oral   Take 25 mcg by mouth daily. Takes tablet and 25 mcg tablet for total dose of         . losartan (COZAAR) 25 MG tablet   Oral   Take 25 mg by mouth daily.         . nitroGLYCERIN (NITROSTAT) 0.4 MG SL tablet   Sublingual   Place 0.4 mg under the tongue every 5 (five) minutes x 3 doses as needed for chest pain.         . phenytoin (DILANTIN) 100 MG ER capsule   Oral   Take 100-300 mg by mouth 2 (two) times daily. 100 mg in the morning and 300 mg at night         . potassium chloride (K-DUR) 10 MEQ tablet   Oral   Take 20 mEq by mouth daily.         Marland Kitchen spironolactone (ALDACTONE) 12.5 mg TABS   Oral   Take 0.5 tablets (12.5 mg total) by mouth daily.   30 tablet   12   .  Tamsulosin HCl (FLOMAX) 0.4 MG CAPS   Oral   Take 0.4 mg by mouth 2 (two) times daily.           BP 138/70  Pulse 60  Temp(Src) 97.5 F (36.4 C) (Oral)  Resp 20  Ht 5\' 9"  (1.753 m)  Wt 248 lb (112.492 kg)  BMI 36.61 kg/m2  SpO2 92%  Physical Exam  Nursing note and vitals reviewed. Constitutional: He is oriented to person, place, and time. He appears well-developed and  well-nourished. No distress.  HENT:  Head: Normocephalic and atraumatic.  Neck: Normal range of motion. Neck supple.  Cardiovascular: Normal rate and regular rhythm.   No murmur heard. Pulmonary/Chest: Effort normal. No respiratory distress.  There are rales in the bases bilaterally.  Abdominal: Soft. Bowel sounds are normal. He exhibits no distension. There is no tenderness.  Musculoskeletal: Normal range of motion. He exhibits edema and tenderness.  There is 3+ pitting edema in the ble.  Neurological: He is alert and oriented to person, place, and time.  Skin: Skin is warm and dry. He is not diaphoretic.    ED Course  Procedures (including critical care time)  Labs Reviewed - No data to display No results found.   No diagnosis found.   Date: 08/12/2012  Rate: 65  Rhythm: normal sinus rhythm  QRS Axis: right  Intervals: normal  ST/T Wave abnormalities: nonspecific T wave changes  Conduction Disutrbances:right bundle branch block  Narrative Interpretation:   Old EKG Reviewed: unchanged    MDM  The patient presents here with chest pain, shortness of breath.  He has a history of CAD, CHF.  The workup today looks like chf.  There is no bump in the troponin and ekg is unchanged.  I have consulted Dr. Sudie Bailey who is covering for Dr. Juanetta Gosling.  Patient to be admitted to the hospital.     After arrangements for admission were made, the patient's daughter came to the desk requesting for him to be discharged.  He is very anxious and he wants to go home.  I explained to him there are risks associated with leaving the hospital, including death and disability.  He is willing to accept these risks and will sign out ama.       Geoffery Lyons, MD 08/12/12 2956  Geoffery Lyons, MD 08/12/12 [phone]

## 2013-03-12 ENCOUNTER — Encounter: Payer: Self-pay | Admitting: Internal Medicine

## 2013-03-12 ENCOUNTER — Ambulatory Visit (INDEPENDENT_AMBULATORY_CARE_PROVIDER_SITE_OTHER): Payer: Medicare Other | Admitting: Internal Medicine

## 2013-03-12 VITALS — BP 126/56 | HR 50 | Ht 69.0 in | Wt 227.8 lb

## 2013-03-12 DIAGNOSIS — I472 Ventricular tachycardia: Secondary | ICD-10-CM

## 2013-03-12 DIAGNOSIS — I259 Chronic ischemic heart disease, unspecified: Secondary | ICD-10-CM

## 2013-03-12 DIAGNOSIS — R000 Tachycardia, unspecified: Secondary | ICD-10-CM

## 2013-03-12 DIAGNOSIS — I5023 Acute on chronic systolic (congestive) heart failure: Secondary | ICD-10-CM

## 2013-03-12 NOTE — Patient Instructions (Addendum)
Your physician recommends that you schedule a follow-up appointment in: 1 year with Dr taylor You will receive a reminder letter two months in advance reminding you to call and schedule your appointment. If you don't receive this letter, please contact our office.   Your physician recommends that you continue on your current medications as directed. Please refer to the Current Medication list given to you today.  

## 2013-03-12 NOTE — Assessment & Plan Note (Signed)
His heart failure symptoms are class II. No change in medical therapy. I've encouraged the patient to maintain a low-sodium diet.

## 2013-03-12 NOTE — Progress Notes (Signed)
HPI Mr. Kelsay returns today for followup. He is a very pleasant 79 year old man with an ischemic cardiomyopathy, mild to moderate left ventricular dysfunction, class II congestive heart failure, and ventricular tachycardia. At the time of his hospitalization over a year  ago, he refused invasive evaluation. He specifically refused catheterization and ICD implantation. The patient has done well in the interim. He does have symptoms of claudication. He denies chest pain or shortness of breath. I suspect he is fairly sedentary. No syncope. His blood pressure is much better controlled today than it has been previously. No Known Allergies   Current Outpatient Prescriptions  Medication Sig Dispense Refill  . allopurinol (ZYLOPRIM) 300 MG tablet Take 300 mg by mouth daily.      Marland Kitchen ALPRAZolam (XANAX) 0.5 MG tablet Take 0.5 mg by mouth 3 (three) times daily as needed. For anxiety      . amiodarone (PACERONE) 200 MG tablet Take 1 tablet (200 mg total) by mouth daily.  60 tablet  6  . amLODipine (NORVASC) 5 MG tablet Take 5 mg by mouth daily.      Marland Kitchen atorvastatin (LIPITOR) 80 MG tablet Take 40 mg by mouth at bedtime.       . carvedilol (COREG) 25 MG tablet Take 25 mg by mouth 2 (two) times daily with a meal.      . dipyridamole-aspirin (AGGRENOX) 25-200 MG per 12 hr capsule Take 1 capsule by mouth 2 (two) times daily.      . fenofibrate 160 MG tablet Take 160 mg by mouth daily.      . furosemide (LASIX) 40 MG tablet Take 40 mg by mouth daily. May take twice daily if needed      . glimepiride (AMARYL) 4 MG tablet Take 4 mg by mouth 2 (two) times daily with a meal.       . hydrALAZINE (APRESOLINE) 50 MG tablet Take 50 mg by mouth 4 (four) times daily.      Marland Kitchen HYDROcodone-acetaminophen (NORCO/VICODIN) 5-325 MG per tablet Take 1 tablet by mouth every 6 (six) hours as needed for pain.      Marland Kitchen imipramine (TOFRANIL) 25 MG tablet Take 50 mg by mouth at bedtime.      . insulin glargine (LANTUS) 100 UNIT/ML injection  Inject 25 Units into the skin at bedtime.      . levETIRAcetam (KEPPRA) 500 MG tablet Take 500 mg by mouth 2 (two) times daily.      Marland Kitchen levothyroxine (SYNTHROID, LEVOTHROID) 200 MCG tablet Takes tablet and 25 mcg tablet for total dose of      . levothyroxine (SYNTHROID, LEVOTHROID) 25 MCG tablet Takes tablet and 25 mcg tablet for total dose of      . losartan (COZAAR) 25 MG tablet Take 25 mg by mouth daily.      . nitroGLYCERIN (NITROSTAT) 0.4 MG SL tablet Place 0.4 mg under the tongue every 5 (five) minutes x 3 doses as needed for chest pain.      . phenytoin (DILANTIN) 100 MG ER capsule Take 100-300 mg by mouth 2 (two) times daily. 100 mg in the morning and 300 mg at night      . potassium chloride (K-DUR) 10 MEQ tablet Take 20 mEq by mouth daily.      Marland Kitchen spironolactone (ALDACTONE) 12.5 mg TABS Take 0.5 tablets (12.5 mg total) by mouth daily.  30 tablet  12  . Tamsulosin HCl (FLOMAX) 0.4 MG CAPS Take 0.4 mg by mouth 2 (  two) times daily.      . cephALEXin (KEFLEX) 500 MG capsule Take 1 capsule by mouth daily.      . ciprofloxacin (CILOXAN) 0.3 % ophthalmic solution 1 drop 2 (two) times daily.      . promethazine (PHENERGAN) 25 MG tablet Take 1 tablet by mouth daily.       No current facility-administered medications for this visit.     Past Medical History  Diagnosis Date  . Arteriosclerotic cardiovascular disease (ASCVD) 2000    CABG surgery in 2000 following acute MI; moderate LV dysfunction; EF of 45-50% by echocardiogram in 2005; 05/2010-admitted with CHF and hypertension, EF of 45% by echo, patient refused cardiac catheterization  . Gout   . Hypothyroidism   . Seizures   . Dysrhythmia     wide complex tachycardia  . Diabetes mellitus, type 2     insulin dependent  . Cerebrovascular disease 2004    Left cerebral CVA followed by carotid endarterectomy in [place]  . Hypertension   . Gastritis 2004    with gastric erosions  . Hyperlipidemia   . CHF  (congestive heart failure)     ROS:   All systems reviewed and negative except as noted in the HPI.   Past Surgical History  Procedure Laterality Date  . Coronary artery bypass graft  04/1999    Dr. Barry Dienes  . Carotid endarterectomy    . Cholecystectomy    . Cardiac surgery       Family History  Problem Relation Age of Onset  . Heart disease Father      History   Social History  . Marital Status: Widowed    Spouse Name: N/A    Number of Children: 5  . Years of Education: N/A   Occupational History  . Retired     Previously employed by [employer]   Social History Main Topics  . Smoking status: Former Smoker -- 1.50 packs/day for 40 years    Quit date: 04/08/1999  . Smokeless tobacco: Never Used     Comment: quit 1999  . Alcohol Use: No  . Drug Use: No  . Sexual Activity: Not Currently   Other Topics Concern  . Not on file   Social History Narrative  . No narrative on file     BP 126/56  Pulse 50  Ht 5\' 9"  (1.753 m)  Wt 227 lb 12.8 oz (103.329 kg)  BMI 33.62 kg/m2  Physical Exam:  Well appearing 79 year old man, NAD HEENT: Unremarkable Neck:  No JVD, no thyromegally Lungs:  Clear with no wheezes, rales, or rhonchi. HEART:  Regular rate rhythm, no murmurs, no rubs, no clicks Abd:  soft, positive bowel sounds, no organomegally, no rebound, no guarding Ext:  2 plus pulses, no edema, no cyanosis, no clubbing Skin:  No rashes no nodules Neuro:  CN II through XII intact, motor grossly intact  EKG Normal sinus rhythm with rightward axis and right bundle branch block   Assess/Plan:

## 2013-03-12 NOTE — Assessment & Plan Note (Signed)
Since his emergency room visit over a year ago, he has had no recurrent ventricular tachycardia on amiodarone. The patient refused ICD therapy.

## 2013-03-12 NOTE — Assessment & Plan Note (Signed)
He is 15 years out from bypass surgery. He denies anginal symptoms. He'll continue his current medical therapy.

## 2013-08-19 ENCOUNTER — Encounter (HOSPITAL_COMMUNITY): Payer: Self-pay | Admitting: Emergency Medicine

## 2013-08-19 ENCOUNTER — Emergency Department (HOSPITAL_COMMUNITY): Payer: Medicare Other

## 2013-08-19 ENCOUNTER — Inpatient Hospital Stay (HOSPITAL_COMMUNITY)
Admission: EM | Admit: 2013-08-19 | Discharge: 2013-08-25 | DRG: 291 | Disposition: A | Discharge status: Home Health | Payer: Medicare Other | Attending: Pulmonary Disease | Admitting: Pulmonary Disease

## 2013-08-19 DIAGNOSIS — L03119 Cellulitis of unspecified part of limb: Secondary | ICD-10-CM

## 2013-08-19 DIAGNOSIS — R0902 Hypoxemia: Secondary | ICD-10-CM | POA: Diagnosis present

## 2013-08-19 DIAGNOSIS — F0391 Unspecified dementia with behavioral disturbance: Secondary | ICD-10-CM | POA: Diagnosis present

## 2013-08-19 DIAGNOSIS — I5023 Acute on chronic systolic (congestive) heart failure: Secondary | ICD-10-CM

## 2013-08-19 DIAGNOSIS — G40909 Epilepsy, unspecified, not intractable, without status epilepticus: Secondary | ICD-10-CM | POA: Diagnosis present

## 2013-08-19 DIAGNOSIS — I509 Heart failure, unspecified: Secondary | ICD-10-CM

## 2013-08-19 DIAGNOSIS — Z794 Long term (current) use of insulin: Secondary | ICD-10-CM

## 2013-08-19 DIAGNOSIS — L02419 Cutaneous abscess of limb, unspecified: Secondary | ICD-10-CM | POA: Diagnosis present

## 2013-08-19 DIAGNOSIS — Y95 Nosocomial condition: Secondary | ICD-10-CM

## 2013-08-19 DIAGNOSIS — J189 Pneumonia, unspecified organism: Secondary | ICD-10-CM

## 2013-08-19 DIAGNOSIS — L97909 Non-pressure chronic ulcer of unspecified part of unspecified lower leg with unspecified severity: Secondary | ICD-10-CM | POA: Diagnosis present

## 2013-08-19 DIAGNOSIS — I5043 Acute on chronic combined systolic (congestive) and diastolic (congestive) heart failure: Secondary | ICD-10-CM | POA: Diagnosis present

## 2013-08-19 DIAGNOSIS — N184 Chronic kidney disease, stage 4 (severe): Secondary | ICD-10-CM | POA: Diagnosis present

## 2013-08-19 DIAGNOSIS — Z9981 Dependence on supplemental oxygen: Secondary | ICD-10-CM

## 2013-08-19 DIAGNOSIS — E039 Hypothyroidism, unspecified: Secondary | ICD-10-CM

## 2013-08-19 DIAGNOSIS — Z951 Presence of aortocoronary bypass graft: Secondary | ICD-10-CM

## 2013-08-19 DIAGNOSIS — I2510 Atherosclerotic heart disease of native coronary artery without angina pectoris: Secondary | ICD-10-CM

## 2013-08-19 DIAGNOSIS — I129 Hypertensive chronic kidney disease with stage 1 through stage 4 chronic kidney disease, or unspecified chronic kidney disease: Secondary | ICD-10-CM | POA: Diagnosis present

## 2013-08-19 DIAGNOSIS — K2970 Gastritis, unspecified, without bleeding: Secondary | ICD-10-CM

## 2013-08-19 DIAGNOSIS — I2589 Other forms of chronic ischemic heart disease: Secondary | ICD-10-CM | POA: Diagnosis present

## 2013-08-19 DIAGNOSIS — N189 Chronic kidney disease, unspecified: Secondary | ICD-10-CM

## 2013-08-19 DIAGNOSIS — I679 Cerebrovascular disease, unspecified: Secondary | ICD-10-CM | POA: Diagnosis present

## 2013-08-19 DIAGNOSIS — K2990 Gastroduodenitis, unspecified, without bleeding: Secondary | ICD-10-CM

## 2013-08-19 DIAGNOSIS — J441 Chronic obstructive pulmonary disease with (acute) exacerbation: Secondary | ICD-10-CM

## 2013-08-19 DIAGNOSIS — J449 Chronic obstructive pulmonary disease, unspecified: Secondary | ICD-10-CM | POA: Diagnosis present

## 2013-08-19 DIAGNOSIS — I69959 Hemiplegia and hemiparesis following unspecified cerebrovascular disease affecting unspecified side: Secondary | ICD-10-CM

## 2013-08-19 DIAGNOSIS — J9611 Chronic respiratory failure with hypoxia: Secondary | ICD-10-CM | POA: Diagnosis present

## 2013-08-19 DIAGNOSIS — Z8249 Family history of ischemic heart disease and other diseases of the circulatory system: Secondary | ICD-10-CM

## 2013-08-19 DIAGNOSIS — I259 Chronic ischemic heart disease, unspecified: Secondary | ICD-10-CM

## 2013-08-19 DIAGNOSIS — I252 Old myocardial infarction: Secondary | ICD-10-CM

## 2013-08-19 DIAGNOSIS — J4489 Other specified chronic obstructive pulmonary disease: Secondary | ICD-10-CM | POA: Diagnosis present

## 2013-08-19 DIAGNOSIS — E785 Hyperlipidemia, unspecified: Secondary | ICD-10-CM | POA: Diagnosis present

## 2013-08-19 DIAGNOSIS — J9610 Chronic respiratory failure, unspecified whether with hypoxia or hypercapnia: Secondary | ICD-10-CM | POA: Diagnosis present

## 2013-08-19 DIAGNOSIS — F03918 Unspecified dementia, unspecified severity, with other behavioral disturbance: Secondary | ICD-10-CM | POA: Diagnosis present

## 2013-08-19 DIAGNOSIS — I200 Unstable angina: Secondary | ICD-10-CM

## 2013-08-19 DIAGNOSIS — Z87891 Personal history of nicotine dependence: Secondary | ICD-10-CM

## 2013-08-19 DIAGNOSIS — I10 Essential (primary) hypertension: Secondary | ICD-10-CM

## 2013-08-19 DIAGNOSIS — N179 Acute kidney failure, unspecified: Secondary | ICD-10-CM | POA: Diagnosis present

## 2013-08-19 DIAGNOSIS — E119 Type 2 diabetes mellitus without complications: Secondary | ICD-10-CM

## 2013-08-19 HISTORY — DX: Ischemic cardiomyopathy: I25.5

## 2013-08-19 HISTORY — DX: Atherosclerotic heart disease of native coronary artery without angina pectoris: I25.10

## 2013-08-19 HISTORY — DX: Essential (primary) hypertension: I10

## 2013-08-19 HISTORY — DX: Ventricular tachycardia: I47.2

## 2013-08-19 HISTORY — DX: Ventricular tachycardia, unspecified: I47.20

## 2013-08-19 LAB — BASIC METABOLIC PANEL
BUN: 69 mg/dL — AB (ref 6–23)
CALCIUM: 10 mg/dL (ref 8.4–10.5)
CO2: 29 mEq/L (ref 19–32)
CREATININE: 2.28 mg/dL — AB (ref 0.50–1.35)
Chloride: 102 mEq/L (ref 96–112)
GFR calc Af Amer: 30 mL/min — ABNORMAL LOW (ref >90)
GFR, EST NON AFRICAN AMERICAN: 26 mL/min — AB (ref >90)
GLUCOSE: 210 mg/dL — AB (ref 70–99)
Potassium: 4.8 mEq/L (ref 3.7–5.3)
Sodium: 142 mEq/L (ref 137–147)

## 2013-08-19 LAB — CBC
HEMATOCRIT: 35 % — AB (ref 39.0–52.0)
HEMOGLOBIN: 11.4 g/dL — AB (ref 13.0–17.0)
MCH: 31.8 pg (ref 26.0–34.0)
MCHC: 32.6 g/dL (ref 30.0–36.0)
MCV: 97.8 fL (ref 78.0–100.0)
Platelets: 164 10*3/uL (ref 150–400)
RBC: 3.58 MIL/uL — ABNORMAL LOW (ref 4.22–5.81)
RDW: 14.5 % (ref 11.5–15.5)
WBC: 5.7 10*3/uL (ref 4.0–10.5)

## 2013-08-19 LAB — URINALYSIS, ROUTINE W REFLEX MICROSCOPIC
BILIRUBIN URINE: NEGATIVE
GLUCOSE, UA: NEGATIVE mg/dL
HGB URINE DIPSTICK: NEGATIVE
Ketones, ur: NEGATIVE mg/dL
Leukocytes, UA: NEGATIVE
Nitrite: NEGATIVE
PROTEIN: NEGATIVE mg/dL
Specific Gravity, Urine: 1.015 (ref 1.005–1.030)
Urobilinogen, UA: 0.2 mg/dL (ref 0.0–1.0)
pH: 5.5 (ref 5.0–8.0)

## 2013-08-19 LAB — CBG MONITORING, ED: Glucose-Capillary: 177 mg/dL — ABNORMAL HIGH (ref 70–99)

## 2013-08-19 LAB — TROPONIN I

## 2013-08-19 LAB — PRO B NATRIURETIC PEPTIDE: Pro B Natriuretic peptide (BNP): 2134 pg/mL — ABNORMAL HIGH (ref 0–450)

## 2013-08-19 MED ORDER — TAMSULOSIN HCL 0.4 MG PO CAPS
0.4000 mg | ORAL_CAPSULE | Freq: Two times a day (BID) | ORAL | Status: DC
  Administered 2013-08-20 – 2013-08-24 (×8): 0.4 mg via ORAL
  Filled 2013-08-19 (×10): qty 1

## 2013-08-19 MED ORDER — CARVEDILOL 12.5 MG PO TABS
25.0000 mg | ORAL_TABLET | Freq: Two times a day (BID) | ORAL | Status: DC
  Administered 2013-08-20 – 2013-08-25 (×9): 25 mg via ORAL
  Filled 2013-08-19 (×11): qty 2

## 2013-08-19 MED ORDER — ASPIRIN-DIPYRIDAMOLE ER 25-200 MG PO CP12
1.0000 | ORAL_CAPSULE | Freq: Two times a day (BID) | ORAL | Status: DC
  Administered 2013-08-20 – 2013-08-24 (×6): 1 via ORAL
  Filled 2013-08-19 (×14): qty 1

## 2013-08-19 MED ORDER — LEVETIRACETAM 500 MG PO TABS
500.0000 mg | ORAL_TABLET | Freq: Two times a day (BID) | ORAL | Status: DC
  Administered 2013-08-20 – 2013-08-24 (×8): 500 mg via ORAL
  Filled 2013-08-19 (×10): qty 1

## 2013-08-19 MED ORDER — CIPROFLOXACIN HCL 0.3 % OP SOLN: 1.0000 [drp] | Freq: Two times a day (BID) | OPHTHALMIC | Status: DC

## 2013-08-19 MED ORDER — POLYETHYLENE GLYCOL 3350 17 G PO PACK
17.0000 g | PACK | Freq: Every day | ORAL | Status: DC | PRN
  Administered 2013-08-23: 17 g via ORAL
  Filled 2013-08-19: qty 1

## 2013-08-19 MED ORDER — HYDRALAZINE HCL 25 MG PO TABS
50.0000 mg | ORAL_TABLET | Freq: Four times a day (QID) | ORAL | Status: DC
  Administered 2013-08-20 – 2013-08-25 (×16): 50 mg via ORAL
  Filled 2013-08-19 (×19): qty 2

## 2013-08-19 MED ORDER — FUROSEMIDE 10 MG/ML IJ SOLN
40.0000 mg | Freq: Once | INTRAMUSCULAR | Status: AC
  Administered 2013-08-19: 40 mg via INTRAVENOUS
  Filled 2013-08-19: qty 4

## 2013-08-19 MED ORDER — DOXYCYCLINE HYCLATE 100 MG PO TABS
100.0000 mg | ORAL_TABLET | Freq: Two times a day (BID) | ORAL | Status: DC
  Administered 2013-08-20 – 2013-08-24 (×9): 100 mg via ORAL
  Filled 2013-08-19 (×10): qty 1

## 2013-08-19 MED ORDER — NITROGLYCERIN 0.4 MG SL SUBL: 0.4000 mg | SUBLINGUAL_TABLET | SUBLINGUAL | Status: DC | PRN

## 2013-08-19 MED ORDER — INSULIN ASPART 100 UNIT/ML ~~LOC~~ SOLN
0.0000 [IU] | Freq: Three times a day (TID) | SUBCUTANEOUS | Status: DC
Start: 2013-08-20 — End: 2013-08-25
  Administered 2013-08-20: 2 [IU] via SUBCUTANEOUS
  Administered 2013-08-23: 3 [IU] via SUBCUTANEOUS
  Administered 2013-08-23: 2 [IU] via SUBCUTANEOUS
  Administered 2013-08-23: 3 [IU] via SUBCUTANEOUS
  Administered 2013-08-24 (×2): 2 [IU] via SUBCUTANEOUS
  Administered 2013-08-24: 3 [IU] via SUBCUTANEOUS
  Administered 2013-08-25: 2 [IU] via SUBCUTANEOUS

## 2013-08-19 MED ORDER — FENOFIBRATE 160 MG PO TABS
160.0000 mg | ORAL_TABLET | Freq: Every morning | ORAL | Status: DC
  Administered 2013-08-20 – 2013-08-24 (×4): 160 mg via ORAL
  Filled 2013-08-19 (×7): qty 1

## 2013-08-19 MED ORDER — ONDANSETRON HCL 4 MG PO TABS: 4.0000 mg | ORAL_TABLET | Freq: Four times a day (QID) | ORAL | Status: DC | PRN

## 2013-08-19 MED ORDER — GUAIFENESIN-DM 100-10 MG/5ML PO SYRP: 5.0000 mL | ORAL_SOLUTION | ORAL | Status: DC | PRN

## 2013-08-19 MED ORDER — AMLODIPINE BESYLATE 5 MG PO TABS
5.0000 mg | ORAL_TABLET | Freq: Every morning | ORAL | Status: DC
  Administered 2013-08-20 – 2013-08-24 (×4): 5 mg via ORAL
  Filled 2013-08-19 (×5): qty 1

## 2013-08-19 MED ORDER — AMIODARONE HCL 200 MG PO TABS
200.0000 mg | ORAL_TABLET | Freq: Every day | ORAL | Status: DC
  Administered 2013-08-20 – 2013-08-24 (×4): 200 mg via ORAL
  Filled 2013-08-19 (×5): qty 1

## 2013-08-19 MED ORDER — SPIRONOLACTONE 25 MG PO TABS
12.5000 mg | ORAL_TABLET | Freq: Every day | ORAL | Status: DC
  Administered 2013-08-20 – 2013-08-24 (×4): 12.5 mg via ORAL
  Filled 2013-08-19 (×5): qty 1

## 2013-08-19 MED ORDER — GLIMEPIRIDE 2 MG PO TABS
4.0000 mg | ORAL_TABLET | Freq: Two times a day (BID) | ORAL | Status: DC
  Administered 2013-08-20 – 2013-08-25 (×9): 4 mg via ORAL
  Filled 2013-08-19 (×11): qty 2

## 2013-08-19 MED ORDER — FUROSEMIDE 40 MG PO TABS
40.0000 mg | ORAL_TABLET | Freq: Once | ORAL | Status: DC
  Filled 2013-08-19: qty 1

## 2013-08-19 MED ORDER — METOLAZONE 5 MG PO TABS
5.0000 mg | ORAL_TABLET | Freq: Every day | ORAL | Status: AC
  Administered 2013-08-20 – 2013-08-22 (×2): 5 mg via ORAL
  Filled 2013-08-19 (×3): qty 1

## 2013-08-19 MED ORDER — LEVOTHYROXINE SODIUM 25 MCG PO TABS
25.0000 ug | ORAL_TABLET | Freq: Every day | ORAL | Status: DC
  Administered 2013-08-20 – 2013-08-25 (×5): 25 ug via ORAL
  Filled 2013-08-19 (×6): qty 1

## 2013-08-19 MED ORDER — INSULIN ASPART 100 UNIT/ML ~~LOC~~ SOLN: 0.0000 [IU] | Freq: Every day | SUBCUTANEOUS | Status: DC

## 2013-08-19 MED ORDER — FUROSEMIDE 10 MG/ML IJ SOLN
10.0000 mg/h | INTRAVENOUS | Status: AC
  Administered 2013-08-20: 10 mg/h via INTRAVENOUS
  Filled 2013-08-19: qty 25

## 2013-08-19 MED ORDER — SODIUM CHLORIDE 0.9 % IJ SOLN
3.0000 mL | Freq: Two times a day (BID) | INTRAMUSCULAR | Status: DC
  Administered 2013-08-20 – 2013-08-24 (×7): 3 mL via INTRAVENOUS

## 2013-08-19 MED ORDER — ALLOPURINOL 300 MG PO TABS
300.0000 mg | ORAL_TABLET | Freq: Every morning | ORAL | Status: DC
  Administered 2013-08-20 – 2013-08-24 (×4): 300 mg via ORAL
  Filled 2013-08-19 (×5): qty 1

## 2013-08-19 MED ORDER — NITROGLYCERIN 2 % TD OINT
0.5000 [in_us] | TOPICAL_OINTMENT | Freq: Four times a day (QID) | TRANSDERMAL | Status: DC
  Administered 2013-08-20 (×3): 0.5 [in_us] via TOPICAL
  Filled 2013-08-19 (×3): qty 1

## 2013-08-19 MED ORDER — ALPRAZOLAM 0.5 MG PO TABS
0.5000 mg | ORAL_TABLET | Freq: Three times a day (TID) | ORAL | Status: DC | PRN
  Administered 2013-08-20: 0.5 mg via ORAL
  Filled 2013-08-19 (×2): qty 1

## 2013-08-19 MED ORDER — PHENYTOIN SODIUM EXTENDED 100 MG PO CAPS
100.0000 mg | ORAL_CAPSULE | Freq: Every day | ORAL | Status: DC
  Administered 2013-08-20 – 2013-08-23 (×3): 100 mg via ORAL
  Filled 2013-08-19 (×5): qty 1

## 2013-08-19 MED ORDER — POTASSIUM CHLORIDE CRYS ER 10 MEQ PO TBCR
20.0000 meq | EXTENDED_RELEASE_TABLET | Freq: Every day | ORAL | Status: DC
  Administered 2013-08-20: 20 meq via ORAL
  Filled 2013-08-19 (×4): qty 2

## 2013-08-19 MED ORDER — HYDROCODONE-ACETAMINOPHEN 5-325 MG PO TABS
1.0000 | ORAL_TABLET | ORAL | Status: DC | PRN
  Filled 2013-08-19: qty 1

## 2013-08-19 MED ORDER — ONDANSETRON HCL 4 MG/2ML IJ SOLN: 4.0000 mg | Freq: Four times a day (QID) | INTRAMUSCULAR | Status: DC | PRN

## 2013-08-19 MED ORDER — ATORVASTATIN CALCIUM 40 MG PO TABS
40.0000 mg | ORAL_TABLET | Freq: Every day | ORAL | Status: DC
  Administered 2013-08-20 – 2013-08-24 (×5): 40 mg via ORAL
  Filled 2013-08-19 (×5): qty 1

## 2013-08-19 MED ORDER — HEPARIN SODIUM (PORCINE) 5000 UNIT/ML IJ SOLN
5000.0000 [IU] | Freq: Three times a day (TID) | INTRAMUSCULAR | Status: DC
Start: 2013-08-20 — End: 2013-08-25
  Administered 2013-08-20 – 2013-08-25 (×14): 5000 [IU] via SUBCUTANEOUS
  Filled 2013-08-19 (×14): qty 1

## 2013-08-19 MED ORDER — LEVOTHYROXINE SODIUM 100 MCG PO TABS
200.0000 ug | ORAL_TABLET | Freq: Every day | ORAL | Status: DC
  Administered 2013-08-20 – 2013-08-25 (×5): 200 ug via ORAL
  Filled 2013-08-19 (×6): qty 2

## 2013-08-19 MED ORDER — INSULIN GLARGINE 100 UNIT/ML ~~LOC~~ SOLN
25.0000 [IU] | Freq: Every day | SUBCUTANEOUS | Status: DC
  Administered 2013-08-20 – 2013-08-24 (×5): 25 [IU] via SUBCUTANEOUS
  Filled 2013-08-19 (×7): qty 0.25

## 2013-08-19 NOTE — ED Notes (Signed)
Patient's family requesting to get patient something to eat. Family stated patient would not eat meals shown to her. Family member states she wants to go out to get patient something.
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
## 2013-08-19 NOTE — ED Provider Notes (Signed)
CSN: [id number]     Arrival date & time 08/19/13  1616 History  This chart was scribed for Rolland Porter, MD by Danella Maiers, ED Scribe. This patient was seen in room APA02/APA02 and the patient's care was started at 5:07 PM.    Chief Complaint  Patient presents with  . Chest Pain   The history is provided by the patient. No language interpreter was used.   HPI Comments: Donald Owen is a 80 y.o. male with a h/o CHF who presents to the Emergency Department complaining of intermittent CP and SOB onset 2 days ago. Daughter is at bedside, states pt was up all night with CP last night. She been somewhat recants that he is having pain, she states that he has to "sit up on the edge of the  bed to breathe". Pt is unable to describe the quality of the pain. He states the last episode was today but denies any pain currently. He denies pain with breathing. Daughter reports bilateral lower leg swelling at baseline, but today is worse than usual. He denies leg pain. She also reports recurrent bed sores on the lower extremities for the past 2 months. BMs have been normal. He denies nausea, vomiting, fever. He is on 2.5L of home oxygen. He does not have a pacemaker. Daughter states he has been up moving around as usual the last few days.  PCP - Fredirick Maudlin, MD   Past Medical History  Diagnosis Date  . Coronary atherosclerosis of native coronary artery 2000    CABG 2000 following acute MI - refused followup cardiac catheterization 2012  . Gout   . Hypothyroidism   . Seizures   . Ventricular tachycardia     On amiodarone  . Diabetes mellitus, type 2   . Cerebrovascular disease 2004    Left cerebral CVA followed by carotid endarterectomy in [place]  . Essential hypertension, benign   . Gastritis 2004    Gastric erosions  . Hyperlipidemia   . Ischemic cardiomyopathy     LVEF 45% 2013   Past Surgical History  Procedure Laterality Date  . Coronary artery bypass graft  04/1999    Dr. Barry Dienes  .  Carotid endarterectomy    . Cholecystectomy     Family History  Problem Relation Age of Onset  . Heart disease Father    History  Substance Use Topics  . Smoking status: Former Smoker -- 1.50 packs/day for 40 years    Types: Cigarettes    Quit date: 04/08/1999  . Smokeless tobacco: Never Used     Comment: quit 1999  . Alcohol Use: No    Review of Systems  Constitutional: Negative for fever, chills, diaphoresis, appetite change and fatigue.  HENT: Negative for mouth sores, sore throat and trouble swallowing.   Eyes: Negative for visual disturbance.  Respiratory: Positive for shortness of breath. Negative for cough, chest tightness and wheezing.   Cardiovascular: Positive for chest pain and leg swelling.  Gastrointestinal: Negative for nausea, vomiting, abdominal pain, diarrhea and abdominal distention.  Endocrine: Negative for polydipsia, polyphagia and polyuria.  Genitourinary: Negative for dysuria, frequency and hematuria.  Musculoskeletal: Negative for gait problem.  Skin: Negative for color change, pallor and rash.  Neurological: Negative for syncope, light-headedness and headaches.  Hematological: Does not bruise/bleed easily.  Psychiatric/Behavioral: Negative for behavioral problems and confusion.      Allergies  Review of patient's allergies indicates no known allergies.  Home Medications   Prior to Admission medications  Medication Sig Start Date End Date Taking? Authorizing Provider  allopurinol (ZYLOPRIM) 300 MG tablet Take 300 mg by mouth daily.    Historical Provider, MD  ALPRAZolam Prudy Feeler) 0.5 MG tablet Take 0.5 mg by mouth 3 (three) times daily as needed. For anxiety    Historical Provider, MD  amiodarone (PACERONE) 200 MG tablet Take 1 tablet (200 mg total) by mouth daily. 02/15/12 03/12/13  Marinus Maw, MD  amLODipine (NORVASC) 5 MG tablet Take 5 mg by mouth daily.    Historical Provider, MD  atorvastatin (LIPITOR) 80 MG tablet Take 40 mg by mouth at  bedtime.     Historical Provider, MD  carvedilol (COREG) 25 MG tablet Take 25 mg by mouth 2 (two) times daily with a meal.    Historical Provider, MD  cephALEXin (KEFLEX) 500 MG capsule Take 1 capsule by mouth daily. 02/05/13   Historical Provider, MD  ciprofloxacin (CILOXAN) 0.3 % ophthalmic solution 1 drop 2 (two) times daily. 03/09/13   Historical Provider, MD  dipyridamole-aspirin (AGGRENOX) 25-200 MG per 12 hr capsule Take 1 capsule by mouth 2 (two) times daily.    Historical Provider, MD  fenofibrate 160 MG tablet Take 160 mg by mouth daily.    Historical Provider, MD  furosemide (LASIX) 40 MG tablet Take 40 mg by mouth daily. May take twice daily if needed 05/24/11   Jessica A Hope, PA-C  glimepiride (AMARYL) 4 MG tablet Take 4 mg by mouth 2 (two) times daily with a meal.     Historical Provider, MD  hydrALAZINE (APRESOLINE) 50 MG tablet Take 50 mg by mouth 4 (four) times daily.    Historical Provider, MD  HYDROcodone-acetaminophen (NORCO/VICODIN) 5-325 MG per tablet Take 1 tablet by mouth every 6 (six) hours as needed for pain.    Historical Provider, MD  imipramine (TOFRANIL) 25 MG tablet Take 50 mg by mouth at bedtime.    Historical Provider, MD  insulin glargine (LANTUS) 100 UNIT/ML injection Inject 25 Units into the skin at bedtime.    Historical Provider, MD  levETIRAcetam (KEPPRA) 500 MG tablet Take 500 mg by mouth 2 (two) times daily.    Historical Provider, MD  levothyroxine (SYNTHROID, LEVOTHROID) 200 MCG tablet Takes tablet and 25 mcg tablet for total dose of    Historical Provider, MD  levothyroxine (SYNTHROID, LEVOTHROID) 25 MCG tablet Takes tablet and 25 mcg tablet for total dose of    Historical Provider, MD  losartan (COZAAR) 25 MG tablet Take 25 mg by mouth daily.    Historical Provider, MD  nitroGLYCERIN (NITROSTAT) 0.4 MG SL tablet Place 0.4 mg under the tongue every 5 (five) minutes x 3 doses as needed for chest pain.    Historical Provider, MD   phenytoin (DILANTIN) 100 MG ER capsule Take 100-300 mg by mouth 2 (two) times daily. 100 mg in the morning and 300 mg at night    Historical Provider, MD  potassium chloride (K-DUR) 10 MEQ tablet Take 20 mEq by mouth daily.    Historical Provider, MD  promethazine (PHENERGAN) 25 MG tablet Take 1 tablet by mouth daily. 03/09/13   Historical Provider, MD  spironolactone (ALDACTONE) 12.5 mg TABS Take 0.5 tablets (12.5 mg total) by mouth daily. 07/17/12   Fredirick Maudlin, MD  Tamsulosin HCl (FLOMAX) 0.4 MG CAPS Take 0.4 mg by mouth 2 (two) times daily.    Historical Provider, MD   BP 149/48  Pulse 53  Temp(Src) 97.5 F (36.4 C) (Oral)  Resp 18  Ht 5\' 9"  (1.753 m)  Wt 242 lb (109.77 kg)  BMI 35.72 kg/m2  SpO2 98% Physical Exam  Constitutional: He is oriented to person, place, and time. He appears well-developed and well-nourished. No distress.  HENT:  Head: Normocephalic.  Eyes: Conjunctivae are normal. Pupils are equal, round, and reactive to light. No scleral icterus.  Neck: Normal range of motion. Neck supple. No JVD present. No thyromegaly present.  Cardiovascular: Regular rhythm.  Bradycardia present.  Exam reveals no gallop and no friction rub.   No murmur heard. Pulmonary/Chest: Effort normal and breath sounds normal. No respiratory distress. He has no wheezes. He has no rales.  Abdominal: Soft. Bowel sounds are normal. He exhibits no distension. There is no tenderness. There is no rebound.  Musculoskeletal: Normal range of motion. He exhibits edema (3+ symmetric bilateral lower edema).  Erythema diffusely to the lower half of the RLE. Small area of erythema to the LLE.  Neurological: He is alert and oriented to person, place, and time.  Skin: Skin is warm and dry. No rash noted. No pallor.  Psychiatric: He has a normal mood and affect. His behavior is normal.    ED Course  Procedures (including critical care time) Medications  dipyridamole-aspirin (AGGRENOX) 200-25 MG per 12 hr  capsule 1 capsule (1 capsule Oral Given 08/23/13 1002)  phenytoin (DILANTIN) ER capsule 100 mg (100 mg Oral Given 08/23/13 1003)  levothyroxine (SYNTHROID, LEVOTHROID) tablet 200 mcg (200 mcg Oral Given 08/23/13 1003)  levothyroxine (SYNTHROID, LEVOTHROID) tablet 25 mcg (25 mcg Oral Given 08/23/13 1006)  allopurinol (ZYLOPRIM) tablet 300 mg (300 mg Oral Given 08/23/13 1003)  fenofibrate tablet 160 mg (160 mg Oral Given 08/23/13 1002)  atorvastatin (LIPITOR) tablet 40 mg (40 mg Oral Not Given 08/22/13 2200)  glimepiride (AMARYL) tablet 4 mg (4 mg Oral Given 08/23/13 1004)  hydrALAZINE (APRESOLINE) tablet 50 mg (50 mg Oral Given 08/23/13 1219)  amLODipine (NORVASC) tablet 5 mg (5 mg Oral Given 08/23/13 1002)  carvedilol (COREG) tablet 25 mg (25 mg Oral Given 08/23/13 1004)  levETIRAcetam (KEPPRA) tablet 500 mg (500 mg Oral Given 08/23/13 1006)  ALPRAZolam (XANAX) tablet 0.5 mg (0.5 mg Oral Not Given 08/20/13 2226)  tamsulosin (FLOMAX) capsule 0.4 mg (0.4 mg Oral Given 08/23/13 1004)  insulin glargine (LANTUS) injection 25 Units (25 Units Subcutaneous Not Given 08/22/13 2200)  amiodarone (PACERONE) tablet 200 mg (200 mg Oral Given 08/23/13 1004)  nitroGLYCERIN (NITROSTAT) SL tablet 0.4 mg (not administered)  spironolactone (ALDACTONE) tablet 12.5 mg (12.5 mg Oral Given 08/23/13 1007)  metolazone (ZAROXOLYN) tablet 5 mg (5 mg Oral Given 08/22/13 1027)  furosemide (LASIX) 250 mg in dextrose 5 % 250 mL infusion (10 mg/hr Intravenous New Bag/Given 08/20/13 0054)  HYDROcodone-acetaminophen (NORCO/VICODIN) 5-325 MG per tablet 1-2 tablet (not administered)  ondansetron (ZOFRAN) tablet 4 mg (not administered)    Or  ondansetron (ZOFRAN) injection 4 mg (not administered)  guaiFENesin-dextromethorphan (ROBITUSSIN DM) 100-10 MG/5ML syrup 5 mL (not administered)  heparin injection 5,000 Units (5,000 Units Subcutaneous Given 08/23/13 1001)  sodium chloride 0.9 % injection 3 mL (3 mLs Intravenous Given 08/23/13 1011)   polyethylene glycol (MIRALAX / GLYCOLAX) packet 17 g (17 g Oral Given 08/23/13 1001)  insulin aspart (novoLOG) injection 0-15 Units (3 Units Subcutaneous Given 08/23/13 1219)  insulin aspart (novoLOG) injection 0-5 Units (0 Units Subcutaneous Not Given 08/22/13 2200)  doxycycline (VIBRA-TABS) tablet 100 mg (100 mg Oral Given 08/23/13 1004)  phenytoin (DILANTIN) ER capsule 300 mg (300 mg  Oral Not Given 08/22/13 2200)  isosorbide mononitrate (IMDUR) 24 hr tablet 30 mg (30 mg Oral Given 08/23/13 1003)  LORazepam (ATIVAN) injection 1 mg (1 mg Intravenous Given 08/22/13 1217)  furosemide (LASIX) injection 40 mg (40 mg Intravenous Given 08/23/13 1007)  potassium chloride SA (K-DUR,KLOR-CON) CR tablet 20 mEq (20 mEq Oral Given 08/23/13 1003)  ipratropium-albuterol (DUONEB) 0.5-2.5 (3) MG/3ML nebulizer solution 3 mL (3 mLs Nebulization Given 08/23/13 0325)  vancomycin (VANCOCIN) 1,500 mg in sodium chloride 0.9 % 500 mL IVPB (1,500 mg Intravenous Given 08/22/13 2248)  piperacillin-tazobactam (ZOSYN) IVPB 3.375 g (3.375 g Intravenous Pending 08/23/13 1447)  furosemide (LASIX) injection 40 mg (40 mg Intravenous Given 08/19/13 1836)  LORazepam (ATIVAN) injection 1 mg (1 mg Intravenous Given 08/20/13 2308)    DIAGNOSTIC STUDIES: Oxygen Saturation is 98% on [place], normal by my interpretation.    COORDINATION OF CARE: 5:40 PM- Discussed treatment plan with pt which includes possibility for admission. Pt and daughter agree to plan.    Labs Review Labs Reviewed  CBC - Abnormal; Notable for the following:    RBC 3.58 (*)    Hemoglobin 11.4 (*)    HCT 35.0 (*)    All other components within normal limits  BASIC METABOLIC PANEL - Abnormal; Notable for the following:    Glucose, Bld 210 (*)    BUN 69 (*)    Creatinine, Ser 2.28 (*)    GFR calc non Af Amer 26 (*)    GFR calc Af Amer 30 (*)    All other components within normal limits  PRO B NATRIURETIC PEPTIDE - Abnormal; Notable for the following:    Pro B  Natriuretic peptide (BNP) 2134.0 (*)    All other components within normal limits  BASIC METABOLIC PANEL - Abnormal; Notable for the following:    Glucose, Bld 105 (*)    BUN 69 (*)    Creatinine, Ser 2.19 (*)    GFR calc non Af Amer 27 (*)    GFR calc Af Amer 32 (*)    All other components within normal limits  CBC - Abnormal; Notable for the following:    RBC 3.55 (*)    Hemoglobin 11.1 (*)    HCT 34.6 (*)    All other components within normal limits  HEMOGLOBIN A1C - Abnormal; Notable for the following:    Hemoglobin A1C 7.0 (*)    Mean Plasma Glucose 154 (*)    All other components within normal limits  GLUCOSE, CAPILLARY - Abnormal; Notable for the following:    Glucose-Capillary 130 (*)    All other components within normal limits  GLUCOSE, CAPILLARY - Abnormal; Notable for the following:    Glucose-Capillary 159 (*)    All other components within normal limits  GLUCOSE, CAPILLARY - Abnormal; Notable for the following:    Glucose-Capillary 109 (*)    All other components within normal limits  GLUCOSE, CAPILLARY - Abnormal; Notable for the following:    Glucose-Capillary 115 (*)    All other components within normal limits  BASIC METABOLIC PANEL - Abnormal; Notable for the following:    Potassium 3.4 (*)    BUN 62 (*)    Creatinine, Ser 1.96 (*)    GFR calc non Af Amer 31 (*)    GFR calc Af Amer 36 (*)    All other components within normal limits  GLUCOSE, CAPILLARY - Abnormal; Notable for the following:    Glucose-Capillary 115 (*)    All other components  within normal limits  CBC WITH DIFFERENTIAL - Abnormal; Notable for the following:    RBC 3.64 (*)    Hemoglobin 11.5 (*)    HCT 35.5 (*)    Neutrophils Relative % 83 (*)    Lymphocytes Relative 5 (*)    Lymphs Abs 0.5 (*)    Monocytes Absolute 1.1 (*)    All other components within normal limits  BASIC METABOLIC PANEL - Abnormal; Notable for the following:    BUN 64 (*)    Creatinine, Ser 2.02 (*)    GFR  calc non Af Amer 30 (*)    GFR calc Af Amer 35 (*)    All other components within normal limits  BASIC METABOLIC PANEL - Abnormal; Notable for the following:    Potassium 3.5 (*)    Glucose, Bld 142 (*)    BUN 65 (*)    Creatinine, Ser 1.98 (*)    GFR calc non Af Amer 31 (*)    GFR calc Af Amer 36 (*)    All other components within normal limits  GLUCOSE, CAPILLARY - Abnormal; Notable for the following:    Glucose-Capillary 143 (*)    All other components within normal limits  GLUCOSE, CAPILLARY - Abnormal; Notable for the following:    Glucose-Capillary 165 (*)    All other components within normal limits  CBG MONITORING, ED - Abnormal; Notable for the following:    Glucose-Capillary 177 (*)    All other components within normal limits  URINALYSIS, ROUTINE W REFLEX MICROSCOPIC  TROPONIN I  TSH  GLUCOSE, CAPILLARY  GLUCOSE, CAPILLARY  GLUCOSE, CAPILLARY  GLUCOSE, CAPILLARY  GLUCOSE, CAPILLARY  GLUCOSE, CAPILLARY  PHENYTOIN LEVEL, TOTAL  GLUCOSE, CAPILLARY    Imaging Review Dg Chest Port 1 View  08/22/2013   CLINICAL DATA:  Shortness of Breath  EXAM: PORTABLE CHEST - 1 VIEW  COMPARISON:  August 19, 2013  FINDINGS: There is generalized interstitial edema and cardiomegaly. There is pulmonary venous hypertension. There is airspace consolidation in the bases as well. No adenopathy. Patient is status post median sternotomy.  IMPRESSION: Congestive heart failure. Cannot exclude superimposed pneumonia in the bases.   Electronically Signed   By: Bretta Bang M.D.   On: 08/22/2013 20:54     MDM   Final diagnoses:  CHF (congestive heart failure)   X-ray shows CHF. Creatinine is above his baseline 2.2. Most recent baseline 1.7. He has increasing dependent edema. Pro BNP is elevated at 2134. Given IV Lasix 40 mg. EKG shows no acute changes, Has RBBB/LAFB. Pt on his baseline 2.5L [place] O2 and saturating 91%.  I personally performed the services described in this documentation, which  was scribed in my presence. The recorded information has been reviewed and is accurate.   Rolland Porter, MD 08/23/13 [phone]

## 2013-08-19 NOTE — ED Notes (Signed)
Pt co sub-sternal chest pain x2 days, pt also co dizziness with chest pain.

## 2013-08-19 NOTE — ED Notes (Signed)
Patient sitting in bed eating at this time.

## 2013-08-20 ENCOUNTER — Encounter (HOSPITAL_COMMUNITY): Payer: Self-pay | Admitting: Cardiology

## 2013-08-20 DIAGNOSIS — I2510 Atherosclerotic heart disease of native coronary artery without angina pectoris: Secondary | ICD-10-CM

## 2013-08-20 DIAGNOSIS — I200 Unstable angina: Secondary | ICD-10-CM | POA: Diagnosis present

## 2013-08-20 DIAGNOSIS — N179 Acute kidney failure, unspecified: Secondary | ICD-10-CM | POA: Diagnosis present

## 2013-08-20 DIAGNOSIS — I059 Rheumatic mitral valve disease, unspecified: Secondary | ICD-10-CM

## 2013-08-20 DIAGNOSIS — N189 Chronic kidney disease, unspecified: Secondary | ICD-10-CM

## 2013-08-20 DIAGNOSIS — I7090 Unspecified atherosclerosis: Secondary | ICD-10-CM

## 2013-08-20 LAB — GLUCOSE, CAPILLARY
GLUCOSE-CAPILLARY: 159 mg/dL — AB (ref 70–99)
Glucose-Capillary: 78 mg/dL (ref 70–99)
Glucose-Capillary: 92 mg/dL (ref 70–99)
Glucose-Capillary: 130 mg/dL — ABNORMAL HIGH (ref 70–99)

## 2013-08-20 LAB — BASIC METABOLIC PANEL
BUN: 69 mg/dL — ABNORMAL HIGH (ref 6–23)
CO2: 31 meq/L (ref 19–32)
Calcium: 10.1 mg/dL (ref 8.4–10.5)
Chloride: 104 mEq/L (ref 96–112)
Creatinine, Ser: 2.19 mg/dL — ABNORMAL HIGH (ref 0.50–1.35)
GFR calc Af Amer: 32 mL/min — ABNORMAL LOW (ref >90)
GFR, EST NON AFRICAN AMERICAN: 27 mL/min — AB (ref >90)
Glucose, Bld: 105 mg/dL — ABNORMAL HIGH (ref 70–99)
POTASSIUM: 4.1 meq/L (ref 3.7–5.3)
SODIUM: 145 meq/L (ref 137–147)

## 2013-08-20 LAB — TSH: TSH: 2.07 u[IU]/mL (ref 0.350–4.500)

## 2013-08-20 LAB — CBC
HCT: 34.6 % — ABNORMAL LOW (ref 39.0–52.0)
HEMOGLOBIN: 11.1 g/dL — AB (ref 13.0–17.0)
MCH: 31.3 pg (ref 26.0–34.0)
MCHC: 32.1 g/dL (ref 30.0–36.0)
MCV: 97.5 fL (ref 78.0–100.0)
Platelets: 159 10*3/uL (ref 150–400)
RBC: 3.55 MIL/uL — ABNORMAL LOW (ref 4.22–5.81)
RDW: 14.5 % (ref 11.5–15.5)
WBC: 6.1 10*3/uL (ref 4.0–10.5)

## 2013-08-20 LAB — HEMOGLOBIN A1C
HEMOGLOBIN A1C: 7 % — AB (ref <5.7)
Mean Plasma Glucose: 154 mg/dL — ABNORMAL HIGH (ref <117)

## 2013-08-20 MED ORDER — LORAZEPAM 2 MG/ML IJ SOLN
INTRAMUSCULAR | Status: AC
  Administered 2013-08-20: 1 mg via INTRAVENOUS
  Filled 2013-08-20: qty 1

## 2013-08-20 MED ORDER — ISOSORBIDE MONONITRATE ER 60 MG PO TB24
30.0000 mg | ORAL_TABLET | Freq: Every day | ORAL | Status: DC
  Administered 2013-08-20 – 2013-08-24 (×4): 30 mg via ORAL
  Filled 2013-08-20 (×5): qty 1

## 2013-08-20 MED ORDER — PHENYTOIN SODIUM EXTENDED 100 MG PO CAPS
300.0000 mg | ORAL_CAPSULE | Freq: Every day | ORAL | Status: DC
  Administered 2013-08-20 – 2013-08-24 (×4): 300 mg via ORAL
  Filled 2013-08-20 (×5): qty 3

## 2013-08-20 MED ORDER — FUROSEMIDE 10 MG/ML IJ SOLN
INTRAMUSCULAR | Status: AC
  Filled 2013-08-20: qty 30

## 2013-08-20 MED ORDER — LORAZEPAM 2 MG/ML IJ SOLN
1.0000 mg | Freq: Once | INTRAMUSCULAR | Status: AC
  Administered 2013-08-20: 1 mg via INTRAVENOUS

## 2013-08-20 NOTE — Consult Note (Addendum)
WOC wound consult note Reason for Consult: Consult requested for legs; completed via telephone conversation with bedside nurse for description of bilat leg appearance. Wound type: Bedside nurse states there are currently no open wounds.  BLE with generalized edema and erythremia.  Drainage (amount, consistency, odor) Some weeping and scattered areas of clear fluid-filled blisters which have evolved in patchy areas to partial thickness skin loss. Dressing procedure/placement/frequency: Foam dressing to protect weeping areas and absorb drainage. Please re-consult if further assistance is needed.  Thank-you,  Cammie Mcgeeawn Ayomide Zuleta MSN, RN, CWOCN, [place], CNS [phone]

## 2013-08-20 NOTE — Progress Notes (Signed)
Pt  very combative and agitated and want let us put o2 on. Pt spo2 76% on room air and refusing to wear on . Nurse informed to call MD.

## 2013-08-20 NOTE — Progress Notes (Signed)
Echocardiogram 2D Echocardiogram has been performed.  Genene ChurnJames M Eugune Sine 08/20/2013, 3:25 PM

## 2013-08-20 NOTE — Progress Notes (Signed)
Utilization Review Complete  

## 2013-08-20 NOTE — Progress Notes (Signed)
Patient refusing to wear oxygen nasal cannula. Also refuses to take his medications. Oxygen sats are 83%. Advised patient and family at the bedside that patient must wear oxygen because his oxygen levels are too low. Contacted Dr. Renard MatterMcInnis who gave an order to have respiratory try a mask or Bipap. Respiratory called to the floor. Will continue to monitor.

## 2013-08-20 NOTE — Consult Note (Signed)
CARDIOLOGY CONSULT NOTE   Patient ID: Donald Owen MRN: [id number] DOB/AGE: 09-11-1933 80 y.o.  Admit Date: 08/19/2013 Referring Physician: Kari BaarsHawkins, Edward MD Primary Physician: Fredirick MaudlinHAWKINS,EDWARD L, MD Consulting Cardiologist: Nona DellMcDowell, Samuel MD Primary Cardiologist: Lewayne Buntingaylor, Gregg MD Reason for Consultation: Chest Pain with known CAD  Clinical Summary Donald Owen is a 80 y.o.male with known CAD s/p CABG 2000, ICM with systolic dysfunction EF of 45% as of 2013, DM, hypertension, hypothyroidism, and VT treated with Amiodarone. He follows with Dr. Ladona Ridgelaylor, has been managed conservatively, declining both cardiac catheterization and consideration for ICD based on workup over the last few years.  [facility] for NSTEMI, but refused catheterization or nuclear study at that time. He also had VT requiring cardioversion that admission. He was seen by Dr. Ladona Ridgelaylor and was recommended for further invasive work up but refused and was treated medically with BB and Amiodarone.  He is now admitted to the hospital complaining of recurring chest pain on a regular basis since Monday. He denies any change in his cardiac medications. He has had no palpitations or syncope. We're consulted to assist with management.  I discussed the situation with the patient and his daughter present, reviewed his records. Donald Owen was fairly noncommittal about whether he would consider followup invasive cardiac evaluation. He does have an increased risk of contrast nephropathy with cardiac catheterization and we discussed this today. For now he was most comfortable with medication adjustments, we did discuss a followup echocardiogram to reassess LVEF.   No Known Allergies  Medications Scheduled Medications: . allopurinol  300 mg Oral q morning - 10a  . amiodarone  200 mg Oral Daily  . amLODipine  5 mg Oral q morning - 10a  . atorvastatin  40 mg Oral QHS  . carvedilol  25 mg Oral BID WC  .  ciprofloxacin  1 drop Both Eyes BID  . dipyridamole-aspirin  1 capsule Oral BID  . doxycycline  100 mg Oral Q12H  . fenofibrate  160 mg Oral q morning - 10a  . glimepiride  4 mg Oral BID WC  . heparin  5,000 Units Subcutaneous Q8H  . hydrALAZINE  50 mg Oral 4 times per day  . insulin aspart  0-15 Units Subcutaneous TID WC  . insulin aspart  0-5 Units Subcutaneous QHS  . insulin glargine  25 Units Subcutaneous QHS  . levETIRAcetam  500 mg Oral BID  . levothyroxine  200 mcg Oral QAC breakfast  . levothyroxine  25 mcg Oral QAC breakfast  . metolazone  5 mg Oral Daily  . nitroGLYCERIN  0.5 inch Topical 4 times per day  . phenytoin  100 mg Oral Daily  . phenytoin  300 mg Oral QHS  . potassium chloride  20 mEq Oral Daily  . sodium chloride  3 mL Intravenous Q12H  . spironolactone  12.5 mg Oral Daily  . tamsulosin  0.4 mg Oral BID    PRN Medications: ALPRAZolam, guaiFENesin-dextromethorphan, HYDROcodone-acetaminophen, nitroGLYCERIN, ondansetron (ZOFRAN) IV, ondansetron, polyethylene glycol   Past Medical History  Diagnosis Date  . Coronary atherosclerosis of native coronary artery 2000    CABG 2000 following acute MI - refused followup cardiac catheterization 2012  . Gout   . Hypothyroidism   . Seizures   . Ventricular tachycardia     On amiodarone  . Diabetes mellitus, type 2   . Cerebrovascular disease 2004    Left cerebral CV[place]  .  Essential hypertension, benign   . Gastritis 2004    Gastric erosions  . Hyperlipidemia   . Ischemic cardiomyopathy     LVEF 45% 2013    Past Surgical History  Procedure Laterality Date  . Coronary artery bypass graft  04/1999    Dr. Barry Dieneswens  . Carotid endarterectomy    . Cholecystectomy      Family History  Problem Relation Age of Onset  . Heart disease Father     Social History Donald Owen reports that he quit smoking about 14 years ago. His smoking use included Cigarettes. He has a 60  pack-year smoking history. He has never used smokeless tobacco. Donald Owen reports that he does not drink alcohol.  Review of Systems Otherwise reviewed and negative except as outlined.  Physical Examination Blood pressure 134/57, pulse 58, temperature 97.4 F (36.3 C), temperature source Oral, resp. rate 18, height 5\' 9"  (1.753 m), weight 236 lb 1.8 oz (107.1 kg), SpO2 95.00%.  Intake/Output Summary (Last 24 hours) at 08/20/13 1204 Last data filed at 08/20/13 0900  Gross per 24 hour  Intake    291 ml  Output   2300 ml  Net  -2009 ml    Telemetry: Sinus rhythm.  GEN: Chronically ill-appearing, no distress. HEENT: Conjunctiva and lids normal, oropharynx clear. Neck: Supple, no elevated JVP or carotid bruits, no thyromegaly. Lungs: Decreased breath sounds, nonlabored breathing at rest. Cardiac: Regular rate and rhythm, no S3, soft systolic murmur, no pericardial rub. Abdomen: Soft, nontender, bowel sounds present. Extremities: Mild pitting edema, distal pulses 2+. Skin: Warm and dry. Musculoskeletal: No kyphosis. Neuropsychiatric: Alert and oriented x3, affect flat.   Lab Results  Basic Metabolic Panel:  Recent Labs Lab 08/19/13 1632 08/20/13 0631  NA 142 145  K 4.8 4.1  CL 102 104  CO2 29 31  GLUCOSE 210* 105*  BUN 69* 69*  CREATININE 2.28* 2.19*  CALCIUM 10.0 10.1    CBC:  Recent Labs Lab 08/19/13 1632 08/20/13 0631  WBC 5.7 6.1  HGB 11.4* 11.1*  HCT 35.0* 34.6*  MCV 97.8 97.5  PLT 164 159    Cardiac Enzymes:  Recent Labs Lab 08/19/13 1935  TROPONINI <0.30    Radiology: Dg Chest 2 View  08/19/2013   CLINICAL DATA:  Chest pain  EXAM: CHEST  2 VIEW  COMPARISON:  08/12/2012  FINDINGS: Status post CABG. Mild hyperinflation. Mild interstitial prominence bilaterally suspicious for mild interstitial edema. No segmental infiltrate. Mild basilar atelectasis.  IMPRESSION: Mild hyperinflation. Mild interstitial prominence bilaterally suspicious for mild  edema. No segmental infiltrate.   Electronically Signed   By: Natasha MeadLiviu  Pop M.D.   On: 08/19/2013 16:56    ECG: Sinus rhythm with RBBB and LPFB (old).   Impression  1. Recurrent chest pain, certainly angina is to be considered in light of his substrate. ECG is chronically abnormal and his initial cardiac markers are normal.  2. History of multivessel disease status post CABG in 2000. Patient declined invasive evaluation with presentation 2013 in the setting of an NSTEMI. In discussing the situation again with him today, he is noncommittal as to whether he would pursue any further workup. His risk of contrast nephropathy would be high.  3. Ischemic cardiomyopathy, LVEF 45% in 2013.  4. History of VT, currently on amiodarone. He has declined evaluation for ICD in the past.  5. CKD, stage 3, creatinine currently 2.1.  6. Hyperlipidemia, on statin therapy.   Recommendations  Discussed the situation with the patient and  daughter present. Plan at this time will be to continue current medical treatment which is reasonable from cardiac perspective, and Imdur 30 mg daily, and obtain an echocardiogram for reassessment of cardiac structure and function. We can continue to review the situation, however at this point Donald Owen remains noncommittal about pursuing further invasive cardiac workup.  Signed: Bettey Mare. Lyman Bishop NP [facility] 08/20/2013, 12:04 PM Co-Sign MD   Attending note:  Patient seen and examined. Reviewed records and completed above note by Ms. Lawrence NP including the impression and recommendations sections. This reflects my findings and plan.  Jonelle Sidle, M.D., F.A.C.C.

## 2013-08-20 NOTE — Progress Notes (Signed)
RT at the bedside and explained again to the family and the patient that he needs to keep the oxygen on. Patient still combative. Mittens placed on patient but he continues to be combative. O2 sats now 76%. Contacted Dr. Renard MatterMcInnis again. Received order to give iv ativan. Will give ativan and continue to monitor.

## 2013-08-21 LAB — GLUCOSE, CAPILLARY
GLUCOSE-CAPILLARY: 109 mg/dL — AB (ref 70–99)
GLUCOSE-CAPILLARY: 115 mg/dL — AB (ref 70–99)
Glucose-Capillary: 93 mg/dL (ref 70–99)

## 2013-08-21 MED ORDER — LORAZEPAM 2 MG/ML IJ SOLN
1.0000 mg | INTRAMUSCULAR | Status: DC | PRN
  Administered 2013-08-21 – 2013-08-22 (×3): 1 mg via INTRAVENOUS
  Filled 2013-08-21 (×3): qty 1

## 2013-08-21 MED ORDER — FUROSEMIDE 10 MG/ML IJ SOLN
40.0000 mg | Freq: Every day | INTRAMUSCULAR | Status: DC
  Administered 2013-08-21 – 2013-08-23 (×3): 40 mg via INTRAVENOUS
  Filled 2013-08-21 (×4): qty 4

## 2013-08-21 MED ORDER — LORAZEPAM 2 MG/ML IJ SOLN
INTRAMUSCULAR | Status: AC
  Administered 2013-08-21: 1 mg via INTRAVENOUS
  Filled 2013-08-21: qty 1

## 2013-08-21 NOTE — Care Management Note (Addendum)
    Page 1 of 2   08/25/2013     12:04:28 PM CARE MANAGEMENT NOTE 08/25/2013  Patient:  Donald Owen,Donald Owen   Account Number:  [id number]  Date Initiated:  08/21/2013  Documentation initiated by:  Rosemary HolmsOBSON,Aelyn Stanaland  Subjective/Objective Assessment:   Pt admitted from home. Lives at home with his daughter. [facility] RN.     Action/Plan:   Anticipated DC Date:  08/25/2013   Anticipated DC Plan:  HOME W HOME HEALTH SERVICES      DC Planning Services  CM consult      [facility]  Resumption Of Svcs/PTA Provider   Choice offered to / List presented to:  C-1 Patient   DME arranged  HOSPITAL BED      DME agency  [facility]     [facility]  [facility]      [facility]  [facility]   Status of service:  Completed, signed off Medicare Important Message given?  YES (If response is "NO", the following Medicare IM given date fields will be blank) Date Medicare IM given:  08/24/2013 Date Additional Medicare IM given:  08/25/2013  Discharge Disposition:  HOME W HOME HEALTH SERVICES  Per UR Regulation:    If discussed at Long Length of Stay Meetings, dates discussed:   08/25/2013    Comments:  08/25/13 Rosemary HolmsAmy Deissy Guilbert RN BSN CM Spoke with daughter about delivery of bed. They are at home waiting on bed delivery and want EMS. [facility] notified and CSW notified. IM reviewed again with daughter.  11:30 Val RN states family is here ready to take pt home in thier car. Not waiting on Bed to be delivered. 12:00 Family decided on EMS, CSW calling transport. 08/24/13 Rosemary HolmsAmy Tyronda Vizcarrondo RN CM  08/21/13 Rosemary HolmsAmy Lelon Ikard RN BSN CM

## 2013-08-21 NOTE — Progress Notes (Signed)
Patient has pulled off O2 mask and is combative again. Attempted to reorient patient but unsuccessful. Family member at bedside removed mitts from patient's hands. Explained to her that patient oxygen level is low at 80% and he needs to keep the mask and mitts on. Patient also unable to void. Bladder scan showed >900 cc in bladder. Contacted Dr. Renard MatterMcInnis to inform him about the patient's agitation and oxygen sats. Also informed him about the bladder scan results. Received orders for ativan, wrist restraints and foley. Will continue to monitor closely.

## 2013-08-21 NOTE — Progress Notes (Signed)
Primary cardiologist: Dr. Sharrell KuGreg Taylor Consulting cardiologist: Dr. Jonelle SidleSamuel G. Layken Beg  Subjective:   Agitation noted overnight as well as hypoxia, off oxygen. He has had hand mittens and wrist restraints. Resting this morning.   Objective:   Temp:  [97.4 F (36.3 C)-97.9 F (36.6 C)] 97.9 F (36.6 C) (04/17 0419) Pulse Rate:  [51-61] 61 (04/17 0419) Resp:  [18-20] 20 (04/17 0419) BP: (101-165)/(49-65) 154/61 mmHg (04/17 0951) SpO2:  [91 %-99 %] 99 % (04/17 0419) Weight:  [229 lb 4.5 oz (104 kg)-232 lb 12.9 oz (105.6 kg)] 229 lb 4.5 oz (104 kg) (04/17 0608) Last BM Date: 08/18/13  Filed Weights   08/20/13 0405 08/21/13 0603 08/21/13 [id number]  Weight: 236 lb 1.8 oz (107.1 kg) 232 lb 12.9 oz (105.6 kg) 229 lb 4.5 oz (104 kg)    Intake/Output Summary (Last 24 hours) at 08/21/13 1201 Last data filed at 08/21/13 0900  Gross per 24 hour  Intake    120 ml  Output   1400 ml  Net  -1280 ml    Exam:  General: Chronically ill-appearing, resting currently.  Lungs: Decreased breath sounds, nonlabored.  Cardiac: RRR, no gallop, soft systolic murmur.  Extremities: Mild edema.   Lab Results:  Basic Metabolic Panel:  Recent Labs Lab 08/19/13 1632 08/20/13 0631  NA 142 145  K 4.8 4.1  CL 102 104  CO2 29 31  GLUCOSE 210* 105*  BUN 69* 69*  CREATININE 2.28* 2.19*  CALCIUM 10.0 10.1    CBC:  Recent Labs Lab 08/19/13 1632 08/20/13 0631  WBC 5.7 6.1  HGB 11.4* 11.1*  HCT 35.0* 34.6*  MCV 97.8 97.5  PLT 164 159    Cardiac Enzymes:  Recent Labs Lab 08/19/13 1935  TROPONINI <0.30    BNP:  Recent Labs  08/19/13 1632  PROBNP 2134.0*    Echocardiogram (08/20/2013): Study Conclusions  - Left ventricle: Wall thickness was increased in a pattern of moderate LVH. Systolic function was mildly reduced. The estimated ejection fraction was in the range of 45% to 50%. There is akinesis and scarring of the basal-midinferolateral and inferior myocardium.  Doppler parameters are consistent with restrictive physiology, indicative of decreased left ventricular diastolic compliance and/or increased left atrial pressure. - Aortic valve: Moderately calcified annulus. Trileaflet; mildly calcified leaflets. No significant regurgitation. - Mitral valve: Mildly thickened leaflets . Mild regurgitation. - Left atrium: The atrium was severely dilated. - Right ventricle: Systolic function was mildly reduced. - Right atrium: The atrium was moderately dilated. Central venous pressure: 15mm Hg (est). - Atrial septum: No defect or patent foramen ovale was identified. - Tricuspid valve: Trivial regurgitation. - Pulmonary arteries: Systolic pressure was moderately increased. PA peak pressure: 51mm Hg (S). - Pericardium, extracardiac: There was no pericardial effusion. There was a left pleural effusion. Impressions:  - Normal LV chamber size with moderate LVH, inferolateral scar, and LVEF 45-50%. Restrictive filling pattern. Severe left atrial enlargement. Mild mitral regurgitation. Moderately sclerotic aortic valve. Mildly reduced RV contraction. PASP moderately increased at 51 mmHg. Elevated CVP.Left pleural fluid noted.   Medications:   Scheduled Medications: . allopurinol  300 mg Oral q morning - 10a  . amiodarone  200 mg Oral Daily  . amLODipine  5 mg Oral q morning - 10a  . atorvastatin  40 mg Oral QHS  . carvedilol  25 mg Oral BID WC  . dipyridamole-aspirin  1 capsule Oral BID  . doxycycline  100 mg Oral Q12H  . fenofibrate  160 mg  Oral q morning - 10a  . glimepiride  4 mg Oral BID WC  . heparin  5,000 Units Subcutaneous Q8H  . hydrALAZINE  50 mg Oral 4 times per day  . insulin aspart  0-15 Units Subcutaneous TID WC  . insulin aspart  0-5 Units Subcutaneous QHS  . insulin glargine  25 Units Subcutaneous QHS  . isosorbide mononitrate  30 mg Oral Daily  . levETIRAcetam  500 mg Oral BID  . levothyroxine  200 mcg Oral QAC breakfast  .  levothyroxine  25 mcg Oral QAC breakfast  . metolazone  5 mg Oral Daily  . phenytoin  100 mg Oral Daily  . phenytoin  300 mg Oral QHS  . potassium chloride  20 mEq Oral Daily  . sodium chloride  3 mL Intravenous Q12H  . spironolactone  12.5 mg Oral Daily  . tamsulosin  0.4 mg Oral BID      PRN Medications:  ALPRAZolam, guaiFENesin-dextromethorphan, HYDROcodone-acetaminophen, LORazepam, nitroGLYCERIN, ondansetron (ZOFRAN) IV, ondansetron, polyethylene glycol   Assessment:   1. Chest pain, certainly angina is to be considered in light of his substrate. ECG is chronically abnormal and his initial cardiac markers are normal. He has been placed on Imdur.  2. History of multivessel disease status post CABG in 2000. Patient declined invasive evaluation with presentation 2013 in the setting of an NSTEMI. In speaking with patient and daughter yesterday, decision was made to continue medical therapy. He has relatively high risk of contrast nephropathy.  3. Ischemic cardiomyopathy, LVEF 45% in 2013. Followup echocardiogram yesterday noted above shows LVEF 45-50% with restrictive diastolic filling.  4. History of VT, currently on amiodarone. He has declined evaluation for ICD in the past.   5. CKD, stage 3, creatinine currently 2.1.   6. Hyperlipidemia, on statin therapy.   Plan/Discussion:    Further evaluation of recent confusion and hypoxia as per primary team. From a cardiac perspective would continue medical therapy, start aspirin as well. LVEF is stable. Would try to diurese somewhat more, particularly in light of restricted diastolic filling and increased pulmonary pressures. IV Lasix being added.   Jonelle SidleSamuel G. Klint Lezcano, M.D., F.A.C.C.
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
## 2013-08-21 NOTE — Progress Notes (Signed)
Patient resting. No distress noted. Family at bedside. Will continue to monitor closely.

## 2013-08-22 ENCOUNTER — Inpatient Hospital Stay (HOSPITAL_COMMUNITY): Payer: Medicare Other

## 2013-08-22 LAB — CBC WITH DIFFERENTIAL/PLATELET
BASOS ABS: 0 10*3/uL (ref 0.0–0.1)
BASOS PCT: 0 % (ref 0–1)
Eosinophils Absolute: 0 10*3/uL (ref 0.0–0.7)
Eosinophils Relative: 0 % (ref 0–5)
HEMATOCRIT: 35.5 % — AB (ref 39.0–52.0)
Hemoglobin: 11.5 g/dL — ABNORMAL LOW (ref 13.0–17.0)
Lymphocytes Relative: 5 % — ABNORMAL LOW (ref 12–46)
Lymphs Abs: 0.5 10*3/uL — ABNORMAL LOW (ref 0.7–4.0)
MCH: 31.6 pg (ref 26.0–34.0)
MCHC: 32.4 g/dL (ref 30.0–36.0)
MCV: 97.5 fL (ref 78.0–100.0)
Monocytes Absolute: 1.1 10*3/uL — ABNORMAL HIGH (ref 0.1–1.0)
Monocytes Relative: 12 % (ref 3–12)
NEUTROS ABS: 7.4 10*3/uL (ref 1.7–7.7)
Neutrophils Relative %: 83 % — ABNORMAL HIGH (ref 43–77)
PLATELETS: 162 10*3/uL (ref 150–400)
RBC: 3.64 MIL/uL — ABNORMAL LOW (ref 4.22–5.81)
RDW: 14.3 % (ref 11.5–15.5)
WBC: 9 10*3/uL (ref 4.0–10.5)

## 2013-08-22 LAB — BASIC METABOLIC PANEL
BUN: 62 mg/dL — ABNORMAL HIGH (ref 6–23)
BUN: 64 mg/dL — ABNORMAL HIGH (ref 6–23)
CALCIUM: 9.8 mg/dL (ref 8.4–10.5)
CHLORIDE: 102 meq/L (ref 96–112)
CO2: 31 mEq/L (ref 19–32)
CO2: 30 mEq/L (ref 19–32)
Calcium: 9.8 mg/dL (ref 8.4–10.5)
Chloride: 101 mEq/L (ref 96–112)
Creatinine, Ser: 1.96 mg/dL — ABNORMAL HIGH (ref 0.50–1.35)
Creatinine, Ser: 2.02 mg/dL — ABNORMAL HIGH (ref 0.50–1.35)
GFR calc Af Amer: 36 mL/min — ABNORMAL LOW (ref >90)
GFR calc non Af Amer: 31 mL/min — ABNORMAL LOW (ref >90)
GFR calc non Af Amer: 30 mL/min — ABNORMAL LOW (ref >90)
GFR, EST AFRICAN AMERICAN: 35 mL/min — AB (ref >90)
Glucose, Bld: 80 mg/dL (ref 70–99)
Glucose, Bld: 92 mg/dL (ref 70–99)
POTASSIUM: 3.4 meq/L — AB (ref 3.7–5.3)
Potassium: 3.7 mEq/L (ref 3.7–5.3)
SODIUM: 143 meq/L (ref 137–147)
Sodium: 144 mEq/L (ref 137–147)

## 2013-08-22 LAB — GLUCOSE, CAPILLARY
GLUCOSE-CAPILLARY: 75 mg/dL (ref 70–99)
GLUCOSE-CAPILLARY: 76 mg/dL (ref 70–99)
Glucose-Capillary: 115 mg/dL — ABNORMAL HIGH (ref 70–99)
Glucose-Capillary: 80 mg/dL (ref 70–99)

## 2013-08-22 MED ORDER — PIPERACILLIN-TAZOBACTAM 3.375 G IVPB
3.3750 g | Freq: Three times a day (TID) | INTRAVENOUS | Status: DC
  Administered 2013-08-22 – 2013-08-25 (×8): 3.375 g via INTRAVENOUS
  Filled 2013-08-22 (×12): qty 50

## 2013-08-22 MED ORDER — IPRATROPIUM-ALBUTEROL 0.5-2.5 (3) MG/3ML IN SOLN
3.0000 mL | RESPIRATORY_TRACT | Status: DC | PRN
  Administered 2013-08-22 – 2013-08-23 (×2): 3 mL via RESPIRATORY_TRACT
  Filled 2013-08-22: qty 3

## 2013-08-22 MED ORDER — PIPERACILLIN-TAZOBACTAM 3.375 G IVPB
INTRAVENOUS | Status: AC
  Filled 2013-08-22: qty 100

## 2013-08-22 MED ORDER — POTASSIUM CHLORIDE CRYS ER 20 MEQ PO TBCR
20.0000 meq | EXTENDED_RELEASE_TABLET | Freq: Two times a day (BID) | ORAL | Status: DC
Start: 2013-08-22 — End: 2013-08-25
  Administered 2013-08-22 – 2013-08-24 (×5): 20 meq via ORAL
  Filled 2013-08-22 (×5): qty 1

## 2013-08-22 MED ORDER — SODIUM CHLORIDE 0.9 % IV SOLN
1500.0000 mg | INTRAVENOUS | Status: DC
  Administered 2013-08-22 – 2013-08-24 (×3): 1500 mg via INTRAVENOUS
  Filled 2013-08-22 (×4): qty 1500
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
## 2013-08-22 NOTE — Progress Notes (Signed)
ANTIBIOTIC CONSULT NOTE - INITIAL  Pharmacy Consult for Vancomycin and Zosyn  Indication: pneumonia  No Known Allergies  Patient Measurements: Height: 5\' 9"  (175.3 cm) Weight: 229 lb 0.9 oz (103.9 kg) IBW/kg (Calculated) : 70.7  Vital Signs: Temp: 98.8 F (37.1 C) (04/18 2005) Temp src: Oral (04/18 2005) BP: 126/47 mmHg (04/18 2005) Pulse Rate: 71 (04/18 2005) Intake/Output from previous day: 04/17 0701 - 04/18 0700 In: 360 [P.O.:360] Out: 3750 [Urine:3750] Intake/Output from this shift:    Labs:  Recent Labs  08/20/13 0631 08/22/13 0542 08/22/13 2031  WBC 6.1  --  9.0  HGB 11.1*  --  11.5*  PLT 159  --  162  CREATININE 2.19* 1.96* 2.02*   Estimated Creatinine Clearance: 36.4 ml/min (by C-G formula based on Cr of 2.02). No results found for this basename: VANCOTROUGH, VANCOPEAK, VANCORANDOM, GENTTROUGH, GENTPEAK, GENTRANDOM, TOBRATROUGH, TOBRAPEAK, TOBRARND, AMIKACINPEAK, AMIKACINTROU, AMIKACIN,  in the last 72 hours   Microbiology: No results found for this or any previous visit (from the past 720 hour(s)).  Medical History: Past Medical History  Diagnosis Date  . Coronary atherosclerosis of native coronary artery 2000    CABG 2000 following acute MI - refused followup cardiac catheterization 2012  . Gout   . Hypothyroidism   . Seizures   . Ventricular tachycardia     On amiodarone  . Diabetes mellitus, type 2   . Cerebrovascular disease 2004    Left cerebral CV[place]  . Essential hypertension, benign   . Gastritis 2004    Gastric erosions  . Hyperlipidemia   . Ischemic cardiomyopathy     LVEF 45% 2013    Medications:  Scheduled:  . allopurinol  300 mg Oral q morning - 10a  . amiodarone  200 mg Oral Daily  . amLODipine  5 mg Oral q morning - 10a  . atorvastatin  40 mg Oral QHS  . carvedilol  25 mg Oral BID WC  . dipyridamole-aspirin  1 capsule Oral BID  . doxycycline  100 mg Oral Q12H  . fenofibrate   160 mg Oral q morning - 10a  . furosemide  40 mg Intravenous Daily  . glimepiride  4 mg Oral BID WC  . heparin  5,000 Units Subcutaneous Q8H  . hydrALAZINE  50 mg Oral 4 times per day  . insulin aspart  0-15 Units Subcutaneous TID WC  . insulin aspart  0-5 Units Subcutaneous QHS  . insulin glargine  25 Units Subcutaneous QHS  . isosorbide mononitrate  30 mg Oral Daily  . levETIRAcetam  500 mg Oral BID  . levothyroxine  200 mcg Oral QAC breakfast  . levothyroxine  25 mcg Oral QAC breakfast  . metolazone  5 mg Oral Daily  . phenytoin  100 mg Oral Daily  . phenytoin  300 mg Oral QHS  . potassium chloride  20 mEq Oral BID  . sodium chloride  3 mL Intravenous Q12H  . spironolactone  12.5 mg Oral Daily  . tamsulosin  0.4 mg Oral BID   Assessment: Okay for Protocol, fever and increased sputum per RN currently on Doxycycline.  Obesity/Normalized CrCl dosing will be used for Vancomycin dosing.  Estimated Normalized CrCl = 30.8 ml/min.  Vancomycin 4/18 >> Zosyn >>  Goal of Therapy:  Vancomycin trough level 15-20 mcg/ml  Plan:  Zosyn 3.375gm IV every 8 hours. Follow-up micro data, labs, vitals. Vancomycin 1500mg  IV every 24 hours. Measure antibiotic drug levels at steady state Follow  up culture results  Mady GemmaMichael R Kairy Folsom 08/22/2013,9:46 PM

## 2013-08-23 DIAGNOSIS — J189 Pneumonia, unspecified organism: Secondary | ICD-10-CM | POA: Diagnosis not present

## 2013-08-23 DIAGNOSIS — Y95 Nosocomial condition: Secondary | ICD-10-CM

## 2013-08-23 LAB — PHENYTOIN LEVEL, TOTAL: PHENYTOIN LVL: 16.4 ug/mL (ref 10.0–20.0)

## 2013-08-23 LAB — BASIC METABOLIC PANEL
BUN: 65 mg/dL — ABNORMAL HIGH (ref 6–23)
CALCIUM: 9.8 mg/dL (ref 8.4–10.5)
CO2: 29 meq/L (ref 19–32)
Chloride: 101 mEq/L (ref 96–112)
Creatinine, Ser: 1.98 mg/dL — ABNORMAL HIGH (ref 0.50–1.35)
GFR calc Af Amer: 36 mL/min — ABNORMAL LOW (ref >90)
GFR, EST NON AFRICAN AMERICAN: 31 mL/min — AB (ref >90)
Glucose, Bld: 142 mg/dL — ABNORMAL HIGH (ref 70–99)
Potassium: 3.5 mEq/L — ABNORMAL LOW (ref 3.7–5.3)
SODIUM: 145 meq/L (ref 137–147)

## 2013-08-23 LAB — GLUCOSE, CAPILLARY
GLUCOSE-CAPILLARY: 83 mg/dL (ref 70–99)
GLUCOSE-CAPILLARY: 170 mg/dL — AB (ref 70–99)
Glucose-Capillary: 143 mg/dL — ABNORMAL HIGH (ref 70–99)
Glucose-Capillary: 165 mg/dL — ABNORMAL HIGH (ref 70–99)
Glucose-Capillary: 167 mg/dL — ABNORMAL HIGH (ref 70–99)

## 2013-08-23 NOTE — Progress Notes (Signed)
Arrived in room to assess patient.  Family concerned about patient condition.  Patient has been lethargic, will open eyes, be alert for a short period of time, then go back to sleep.  Family states that patient has not been eating dinner, and has been coughing during day with yellow sputum.  Assessed patient.  Patient lethargic, but responsive to touch, is combative when trying to engage patient.  Lungs sounds were diminished, and oxygen sats were decreased on his four liters.  Respiratory came and assessed patient.  Dr. Janna Archdondiego notified of patient condition.  Chest x-ray done and labs drawn.  Results reported back to dr. Janna Archondiego.  Will continue to monitor patient.

## 2013-08-23 NOTE — Progress Notes (Signed)
Subjective: He looks much better but his chest x-ray done yesterday shows pneumonia and he is now on IV antibiotics. He denies any chest pain. His breathing is better. His legs look better  Objective: Vital signs in last 24 hours: Temp:  [98.8 F (37.1 C)-99.2 F (37.3 C)] 98.8 F (37.1 C) (04/19 0444) Pulse Rate:  [67-72] 72 (04/19 0444) Resp:  [20] 20 (04/19 0444) BP: (126-147)/(47-74) 147/62 mmHg (04/19 0444) SpO2:  [89 %-94 %] 89 % (04/19 0444) Weight:  [98.1 kg (216 lb 4.3 oz)] 98.1 kg (216 lb 4.3 oz) (04/19 0444) Weight change: -5.8 kg (-12 lb 12.6 oz) Last BM Date: 08/18/13  Intake/Output from previous day: 04/18 0701 - 04/19 0700 In: 480 [P.O.:480] Out: 2250 [Urine:2250]  PHYSICAL EXAM General appearance: alert, cooperative and mild distress Resp: rhonchi bilaterally Cardio: regular rate and rhythm, S1, S2 normal, no murmur, click, rub or gallop GI: soft, non-tender; bowel sounds normal; no masses,  no organomegaly Extremities: extremities normal, atraumatic, no cyanosis or edema  Lab Results:    Basic Metabolic Panel:  Recent Labs  96/04/54 2031 08/23/13 0530  NA 144 145  K 3.7 3.5*  CL 102 101  CO2 30 29  GLUCOSE 92 142*  BUN 64* 65*  CREATININE 2.02* 1.98*  CALCIUM 9.8 9.8   Liver Function Tests: No results found for this basename: AST, ALT, ALKPHOS, BILITOT, PROT, ALBUMIN,  in the last 72 hours No results found for this basename: LIPASE, AMYLASE,  in the last 72 hours No results found for this basename: AMMONIA,  in the last 72 hours CBC:  Recent Labs  08/22/13 2031  WBC 9.0  NEUTROABS 7.4  HGB 11.5*  HCT 35.5*  MCV 97.5  PLT 162   Cardiac Enzymes: No results found for this basename: CKTOTAL, CKMB, CKMBINDEX, TROPONINI,  in the last 72 hours BNP: No results found for this basename: PROBNP,  in the last 72 hours D-Dimer: No results found for this basename: DDIMER,  in the last 72 hours CBG:  Recent Labs  08/21/13 2132 08/22/13 0734  08/22/13 1158 08/22/13 1615 08/22/13 2104 08/23/13 0750  GLUCAP 115* 75 76 80 83 143*   Hemoglobin A1C: No results found for this basename: HGBA1C,  in the last 72 hours Fasting Lipid Panel: No results found for this basename: CHOL, HDL, LDLCALC, TRIG, CHOLHDL, LDLDIRECT,  in the last 72 hours Thyroid Function Tests: No results found for this basename: TSH, T4TOTAL, FREET4, T3FREE, THYROIDAB,  in the last 72 hours Anemia Panel: No results found for this basename: VITAMINB12, FOLATE, FERRITIN, TIBC, IRON, RETICCTPCT,  in the last 72 hours Coagulation: No results found for this basename: LABPROT, INR,  in the last 72 hours Urine Drug Screen: Drugs of Abuse  No results found for this basename: labopia, cocainscrnur, labbenz, amphetmu, thcu, labbarb    Alcohol Level: No results found for this basename: ETH,  in the last 72 hours Urinalysis: No results found for this basename: COLORURINE, APPERANCEUR, LABSPEC, PHURINE, GLUCOSEU, HGBUR, BILIRUBINUR, KETONESUR, PROTEINUR, UROBILINOGEN, NITRITE, LEUKOCYTESUR,  in the last 72 hours Misc. Labs:  ABGS No results found for this basename: PHART, PCO2, PO2ART, TCO2, HCO3,  in the last 72 hours CULTURES No results found for this or any previous visit (from the past 240 hour(s)). Studies/Results: Dg Chest Port 1 View  08/22/2013   CLINICAL DATA:  Shortness of Breath  EXAM: PORTABLE CHEST - 1 VIEW  COMPARISON:  August 19, 2013  FINDINGS: There is generalized interstitial edema and  cardiomegaly. There is pulmonary venous hypertension. There is airspace consolidation in the bases as well. No adenopathy. Patient is status post median sternotomy.  IMPRESSION: Congestive heart failure. Cannot exclude superimposed pneumonia in the bases.   Electronically Signed   By: Bretta BangWilliam  Woodruff M.D.   On: 08/22/2013 20:54    Medications:  Prior to Admission:  Prescriptions prior to admission  Medication Sig Dispense Refill  . allopurinol (ZYLOPRIM) 300 MG  tablet Take 300 mg by mouth every morning.       Marland Kitchen. ALPRAZolam (XANAX) 0.5 MG tablet Take 0.5 mg by mouth 3 (three) times daily as needed. For anxiety      . amiodarone (PACERONE) 200 MG tablet Take 1 tablet (200 mg total) by mouth daily.  60 tablet  6  . amLODipine (NORVASC) 5 MG tablet Take 5 mg by mouth every morning.       Marland Kitchen. atorvastatin (LIPITOR) 80 MG tablet Take 40 mg by mouth at bedtime.       . carvedilol (COREG) 25 MG tablet Take 25 mg by mouth 2 (two) times daily with a meal.      . ciprofloxacin (CILOXAN) 0.3 % ophthalmic solution Place 1 drop into both eyes 2 (two) times daily. *ONLY USES AS NEEDED FOR FLARE*      . dipyridamole-aspirin (AGGRENOX) 25-200 MG per 12 hr capsule Take 1 capsule by mouth 2 (two) times daily.      . fenofibrate 160 MG tablet Take 160 mg by mouth every morning.       . furosemide (LASIX) 40 MG tablet Take 40 mg by mouth daily. May take twice daily if needed      . glimepiride (AMARYL) 4 MG tablet Take 4 mg by mouth 2 (two) times daily with a meal.       . hydrALAZINE (APRESOLINE) 50 MG tablet Take 50 mg by mouth 4 (four) times daily.      Marland Kitchen. HYDROcodone-acetaminophen (NORCO/VICODIN) 5-325 MG per tablet Take 1 tablet by mouth every 6 (six) hours as needed for pain.      Marland Kitchen. insulin glargine (LANTUS) 100 UNIT/ML injection Inject 25 Units into the skin at bedtime.      . levETIRAcetam (KEPPRA) 500 MG tablet Take 500 mg by mouth 2 (two) times daily.      Marland Kitchen. levothyroxine (SYNTHROID, LEVOTHROID) 200 MCG tablet Takes 200mcg tablet and 25 mcg tablet for total dose of 225mcg      . levothyroxine (SYNTHROID, LEVOTHROID) 25 MCG tablet Takes 200mcg tablet and 25 mcg tablet for total dose of 225mcg      . losartan (COZAAR) 25 MG tablet Take 25 mg by mouth daily.      . metolazone (ZAROXOLYN) 5 MG tablet Take 5 mg by mouth daily. 5 day course starting at 08/18/2013      . phenytoin (DILANTIN) 100 MG ER capsule Take 100-300 mg by mouth 2 (two) times daily. 100 mg in the morning  and 300 mg at night      . potassium chloride (K-DUR) 10 MEQ tablet Take 20 mEq by mouth daily.      . promethazine (PHENERGAN) 25 MG tablet Take 1 tablet by mouth daily as needed for nausea.       Marland Kitchen. spironolactone (ALDACTONE) 12.5 mg TABS Take 0.5 tablets (12.5 mg total) by mouth daily.  30 tablet  12  . Tamsulosin HCl (FLOMAX) 0.4 MG CAPS Take 0.4 mg by mouth 2 (two) times daily.      .Marland Kitchen  nitroGLYCERIN (NITROSTAT) 0.4 MG SL tablet Place 0.4 mg under the tongue every 5 (five) minutes x 3 doses as needed for chest pain.       Scheduled: . allopurinol  300 mg Oral q morning - 10a  . amiodarone  200 mg Oral Daily  . amLODipine  5 mg Oral q morning - 10a  . atorvastatin  40 mg Oral QHS  . carvedilol  25 mg Oral BID WC  . dipyridamole-aspirin  1 capsule Oral BID  . doxycycline  100 mg Oral Q12H  . fenofibrate  160 mg Oral q morning - 10a  . furosemide  40 mg Intravenous Daily  . glimepiride  4 mg Oral BID WC  . heparin  5,000 Units Subcutaneous Q8H  . hydrALAZINE  50 mg Oral 4 times per day  . insulin aspart  0-15 Units Subcutaneous TID WC  . insulin aspart  0-5 Units Subcutaneous QHS  . insulin glargine  25 Units Subcutaneous QHS  . isosorbide mononitrate  30 mg Oral Daily  . levETIRAcetam  500 mg Oral BID  . levothyroxine  200 mcg Oral QAC breakfast  . levothyroxine  25 mcg Oral QAC breakfast  . metolazone  5 mg Oral Daily  . phenytoin  100 mg Oral Daily  . phenytoin  300 mg Oral QHS  . piperacillin-tazobactam (ZOSYN)  IV  3.375 g Intravenous 3 times per day  . potassium chloride  20 mEq Oral BID  . sodium chloride  3 mL Intravenous Q12H  . spironolactone  12.5 mg Oral Daily  . tamsulosin  0.4 mg Oral BID  . vancomycin  1,500 mg Intravenous Q24H   Continuous:  XBJ:[id number]:ALPRAZolam, guaiFENesin-dextromethorphan, HYDROcodone-acetaminophen, ipratropium-albuterol, LORazepam, nitroGLYCERIN, ondansetron (ZOFRAN) IV, ondansetron, polyethylene glycol  Assesment: He was admitted with acute on  chronic systolic heart failure which has improved. He has multiple other medical problems as noted. He has ischemic heart disease but no chest pain now. He has diabetes which is fairly well controlled. He now has hospital acquired pneumonia. This is being treated. He is overall improving. His renal function is pretty stable now. Principal Problem:   Acute on chronic systolic heart failure Active Problems:   HYPOTHYROIDISM   HYPERTENSION   Arteriosclerotic cardiovascular disease (ASCVD)   Diabetes mellitus, type 2   Cerebrovascular disease   Gastritis   Ischemic heart disease   CHF (congestive heart failure)   Acute on chronic combined systolic and diastolic CHF, NYHA class 3   Unstable angina   Acute on chronic renal failure    Plan: Continue current treatments    LOS: 4 days   Fredirick Maudlindward L Annie Roseboom 08/23/2013, 8:30 AM

## 2013-08-24 DIAGNOSIS — F0391 Unspecified dementia with behavioral disturbance: Secondary | ICD-10-CM | POA: Diagnosis present

## 2013-08-24 DIAGNOSIS — J9611 Chronic respiratory failure with hypoxia: Secondary | ICD-10-CM | POA: Diagnosis present

## 2013-08-24 DIAGNOSIS — F03918 Unspecified dementia, unspecified severity, with other behavioral disturbance: Secondary | ICD-10-CM | POA: Diagnosis present

## 2013-08-24 LAB — COMPREHENSIVE METABOLIC PANEL
ALK PHOS: 21 U/L — AB (ref 39–117)
ALT: 11 U/L (ref 0–53)
AST: 24 U/L (ref 0–37)
Albumin: 2.5 g/dL — ABNORMAL LOW (ref 3.5–5.2)
BILIRUBIN TOTAL: 0.5 mg/dL (ref 0.3–1.2)
BUN: 77 mg/dL — AB (ref 6–23)
CHLORIDE: 99 meq/L (ref 96–112)
CO2: 29 mEq/L (ref 19–32)
Calcium: 9.5 mg/dL (ref 8.4–10.5)
Creatinine, Ser: 2.33 mg/dL — ABNORMAL HIGH (ref 0.50–1.35)
GFR calc Af Amer: 29 mL/min — ABNORMAL LOW (ref >90)
GFR calc non Af Amer: 25 mL/min — ABNORMAL LOW (ref >90)
Glucose, Bld: 172 mg/dL — ABNORMAL HIGH (ref 70–99)
Potassium: 3.7 mEq/L (ref 3.7–5.3)
Sodium: 141 mEq/L (ref 137–147)
Total Protein: 6.6 g/dL (ref 6.0–8.3)

## 2013-08-24 LAB — GLUCOSE, CAPILLARY
GLUCOSE-CAPILLARY: 138 mg/dL — AB (ref 70–99)
GLUCOSE-CAPILLARY: 135 mg/dL — AB (ref 70–99)
Glucose-Capillary: 163 mg/dL — ABNORMAL HIGH (ref 70–99)
Glucose-Capillary: 123 mg/dL — ABNORMAL HIGH (ref 70–99)

## 2013-08-24 MED ORDER — ASPIRIN EC 81 MG PO TBEC
81.0000 mg | DELAYED_RELEASE_TABLET | Freq: Every day | ORAL | Status: DC
  Administered 2013-08-24: 81 mg via ORAL
  Filled 2013-08-24: qty 1

## 2013-08-24 NOTE — Progress Notes (Addendum)
Patient ID: Donald Owen, male   DOB: July 03, 1933, 80 y.o.   MRN: [id number]     Subjective:    Productive cough this morning.   Objective:   Temp:  [98 F (36.7 C)-98.6 F (37 C)] 98.6 F (37 C) (04/20 0655) Pulse Rate:  [58-63] 58 (04/20 0655) Resp:  [20] 20 (04/20 0655) BP: (105-148)/(51-94) 120/52 mmHg (04/20 0655) SpO2:  [91 %-96 %] 95 % (04/20 0655) Weight:  [222 lb 7.1 oz (100.9 kg)] 222 lb 7.1 oz (100.9 kg) (04/20 0415) Last BM Date: 08/23/13  Filed Weights   08/22/13 0530 08/23/13 0444 08/24/13 0415  Weight: 229 lb 0.9 oz (103.9 kg) 216 lb 4.3 oz (98.1 kg) 222 lb 7.1 oz (100.9 kg)    Intake/Output Summary (Last 24 hours) at 08/24/13 0907 Last data filed at 08/24/13 0119  Gross per 24 hour  Intake   1150 ml  Output   1075 ml  Net     75 ml     Exam:  General: NAD  Resp:mild coarse sounds bilaterally  Cardiac: RRR, no m/r/g, no JVD  JY:[id number]:abdomen soft, NT, ND  MSK: no LE edema  Neuro: no focal deficits  Psych: appropriat affect  Lab Results:  Basic Metabolic Panel:  Recent Labs Lab 08/22/13 2031 08/23/13 0530 08/24/13 0601  NA 144 145 141  K 3.7 3.5* 3.7  CL 102 101 99  CO2 30 29 29   GLUCOSE 92 142* 172*  BUN 64* 65* 77*  CREATININE 2.02* 1.98* 2.33*  CALCIUM 9.8 9.8 9.5    Liver Function Tests:  Recent Labs Lab 08/24/13 0601  AST 24  ALT 11  ALKPHOS 21*  BILITOT 0.5  PROT 6.6  ALBUMIN 2.5*    CBC:  Recent Labs Lab 08/19/13 1632 08/20/13 0631 08/22/13 2031  WBC 5.7 6.1 9.0  HGB 11.4* 11.1* 11.5*  HCT 35.0* 34.6* 35.5*  MCV 97.8 97.5 97.5  PLT 164 159 162    Cardiac Enzymes:  Recent Labs Lab 08/19/13 1935  TROPONINI <0.30    BNP:  Recent Labs  08/19/13 1632  PROBNP 2134.0*    Coagulation: No results found for this basename: INR,  in the last 168 hours  ECG:   Medications:   Scheduled Medications: . allopurinol  300 mg Oral q morning - 10a  . amiodarone  200 mg Oral Daily  . amLODipine  5 mg  Oral q morning - 10a  . atorvastatin  40 mg Oral QHS  . carvedilol  25 mg Oral BID WC  . dipyridamole-aspirin  1 capsule Oral BID  . doxycycline  100 mg Oral Q12H  . fenofibrate  160 mg Oral q morning - 10a  . glimepiride  4 mg Oral BID WC  . heparin  5,000 Units Subcutaneous Q8H  . hydrALAZINE  50 mg Oral 4 times per day  . insulin aspart  0-15 Units Subcutaneous TID WC  . insulin aspart  0-5 Units Subcutaneous QHS  . insulin glargine  25 Units Subcutaneous QHS  . isosorbide mononitrate  30 mg Oral Daily  . levETIRAcetam  500 mg Oral BID  . levothyroxine  200 mcg Oral QAC breakfast  . levothyroxine  25 mcg Oral QAC breakfast  . phenytoin  100 mg Oral Daily  . phenytoin  300 mg Oral QHS  . piperacillin-tazobactam (ZOSYN)  IV  3.375 g Intravenous 3 times per day  . potassium chloride  20 mEq Oral BID  . sodium chloride  3 mL Intravenous Q12H  .  spironolactone  12.5 mg Oral Daily  . tamsulosin  0.4 mg Oral BID  . vancomycin  1,500 mg Intravenous Q24H     Infusions:     PRN Medications:  ALPRAZolam, guaiFENesin-dextromethorphan, HYDROcodone-acetaminophen, ipratropium-albuterol, LORazepam, nitroGLYCERIN, ondansetron (ZOFRAN) IV, ondansetron, polyethylene glycol     Assessment/Plan    80 yo male hx of CAD with prior CABG, ICM LVEF 45% by echo 2013, HTN, DM, and hx of VT currently on amiodarone.  1. CAD/Chest pain - patient has refused invasive evaluation previously, specifically after NSTEMI in 2013.  - no evidence of ACS by EKG or enzymes - continue current medical therapy, he is on aggrenox which only has 25mg  of ASA below the recommended 81 for secondary prevention, will add ASA 81mg  daily - no plans for ischemic evaluation at this time as patient is not interested in any invasive procedures, if stress test abnormal he would not want the follow up cath.   2. Chronic systolic heart failure - echo 1/61/094/16/15 LVEF 45-50%, akinesis and scarring of midinferolateral and inferior  wall, restrictive diastolic dysfunction, PASP 51.  - he is positive 300 mL yesterday, overall negative 8.3 liters since admission. Cr is trending up, agree with holding lasix today. Would discharge home on toresmide 20mg  bid, with follow and labs in 1 week.   3. Hx of VT - has declined ICD in the past, continue amiodarone.   4. CKD  5. Altered mental status - per primary team  6. Pneumonia - per primary team    No further cardiac testing planned at this point. Recommend starting torsemide 20mg  bid at discharge, would not have him continue home metolazone at this time. Needs f/u with NP Lyman BishopLawrence in 1 week with labs. Will sign off of inpatient care.    Dina RichJonathan Koya Hunger, M.D., F.A.C.C.
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
## 2013-08-24 NOTE — Clinical Documentation Improvement (Signed)
  Clinical Documentation Query 1 of 2 on this same form.  Please scroll down.  Clinical Indicators:   - Nursing documentation that patient has been agitated, combative, pulling off supplemental O2, requiring mittens and wrist restraints, etc.   - "He had a severe stroke, and I think he has some element of dementia related to his stroke and is out  of his normal environment." documented by Dr. Juanetta GoslingHawkins progress note 08/22/13    Possible Conditions:   - Probable Dementia with Behavioral Disturbances   - Other Condition   - Unable to Clinically Determine   Clinical Documentation Query 2 of 2 on this same form.  Clinical Indicators:   - Chronic Home Oxygen Use at 2.5 liters nasal cannula   - Nursing documentation that patient desats into the 70's when 02 is removed by patient   Possible Clinical Conditions:   - Chronic 02 dependent Respiratory Failure   - Other Condition   - Unable to Clinically Determine  Thank You, Jerral Ralphathy R [place] Sloop ,RN Clinical Documentation Specialist:  [phone]  [facility]- [facility] Information Management

## 2013-08-25 LAB — BASIC METABOLIC PANEL
BUN: 84 mg/dL — AB (ref 6–23)
CHLORIDE: 100 meq/L (ref 96–112)
CO2: 28 mEq/L (ref 19–32)
Calcium: 9.6 mg/dL (ref 8.4–10.5)
Creatinine, Ser: 2.44 mg/dL — ABNORMAL HIGH (ref 0.50–1.35)
GFR, EST AFRICAN AMERICAN: 28 mL/min — AB (ref >90)
GFR, EST NON AFRICAN AMERICAN: 24 mL/min — AB (ref >90)
Glucose, Bld: 138 mg/dL — ABNORMAL HIGH (ref 70–99)
Potassium: 3.6 mEq/L — ABNORMAL LOW (ref 3.7–5.3)
Sodium: 141 mEq/L (ref 137–147)

## 2013-08-25 LAB — GLUCOSE, CAPILLARY: Glucose-Capillary: 128 mg/dL — ABNORMAL HIGH (ref 70–99)

## 2013-08-25 MED ORDER — ISOSORBIDE MONONITRATE ER 30 MG PO TB24: 30.0000 mg | ORAL_TABLET | Freq: Every day | ORAL | Status: AC

## 2013-08-25 MED ORDER — LEVOFLOXACIN 500 MG PO TABS: 500.0000 mg | ORAL_TABLET | ORAL | Status: DC

## 2013-08-25 MED ORDER — FUROSEMIDE 40 MG PO TABS: 40.0000 mg | ORAL_TABLET | Freq: Every day | ORAL | Status: DC

## 2013-08-25 MED ORDER — PHENYTOIN SODIUM EXTENDED 100 MG PO CAPS: 200.0000 mg | ORAL_CAPSULE | Freq: Every day | ORAL | Status: DC

## 2013-08-25 NOTE — Discharge Summary (Signed)
Physician Discharge Summary  Patient ID: Donald Owen A Willemsen MRN: [id number] DOB/AGE: 11-24-1933 80 y.o. Primary Care Physician:Blessen Kimbrough L, MD Admit date: 08/19/2013 Discharge date: 08/25/2013    Discharge Diagnoses:   Principal Problem:   Acute on chronic systolic heart failure Active Problems:   HYPOTHYROIDISM   HYPERTENSION   Arteriosclerotic cardiovascular disease (ASCVD)   Diabetes mellitus, type 2   Cerebrovascular disease   Gastritis   Ischemic heart disease   CHF (congestive heart failure)   Acute on chronic combined systolic and diastolic CHF, NYHA class 3   Unstable angina   Acute on chronic renal failure   HAP (hospital-acquired pneumonia)   Dementia with behavioral disturbance   Chronic hypoxemic respiratory failure     Medication List    STOP taking these medications       losartan 25 MG tablet  Commonly known as:  COZAAR     metolazone 5 MG tablet  Commonly known as:  ZAROXOLYN      TAKE these medications       AGGRENOX 200-25 MG per 12 hr capsule  Generic drug:  dipyridamole-aspirin  Take 1 capsule by mouth 2 (two) times daily.     allopurinol 300 MG tablet  Commonly known as:  ZYLOPRIM  Take 300 mg by mouth every morning.     ALPRAZolam 0.5 MG tablet  Commonly known as:  XANAX  Take 0.5 mg by mouth 3 (three) times daily as needed. For anxiety     amiodarone 200 MG tablet  Commonly known as:  PACERONE  Take 1 tablet (200 mg total) by mouth daily.     amLODipine 5 MG tablet  Commonly known as:  NORVASC  Take 5 mg by mouth every morning.     atorvastatin 80 MG tablet  Commonly known as:  LIPITOR  Take 40 mg by mouth at bedtime.     carvedilol 25 MG tablet  Commonly known as:  COREG  Take 25 mg by mouth 2 (two) times daily with a meal.     ciprofloxacin 0.3 % ophthalmic solution  Commonly known as:  CILOXAN  Place 1 drop into both eyes 2 (two) times daily. *ONLY USES AS NEEDED FOR FLARE*     fenofibrate 160 MG tablet  Take 160 mg  by mouth every morning.     furosemide 40 MG tablet  Commonly known as:  LASIX  Take 1 tablet (40 mg total) by mouth daily. May take twice daily if needed     glimepiride 4 MG tablet  Commonly known as:  AMARYL  Take 4 mg by mouth 2 (two) times daily with a meal.     hydrALAZINE 50 MG tablet  Commonly known as:  APRESOLINE  Take 50 mg by mouth 4 (four) times daily.     HYDROcodone-acetaminophen 5-325 MG per tablet  Commonly known as:  NORCO/VICODIN  Take 1 tablet by mouth every 6 (six) hours as needed for pain.     insulin glargine 100 UNIT/ML injection  Commonly known as:  LANTUS  Inject 25 Units into the skin at bedtime.     isosorbide mononitrate 30 MG 24 hr tablet  Commonly known as:  IMDUR  Take 1 tablet (30 mg total) by mouth daily.     levETIRAcetam 500 MG tablet  Commonly known as:  KEPPRA  Take 500 mg by mouth 2 (two) times daily.     levofloxacin 500 MG tablet  Commonly known as:  LEVAQUIN  Take 1 tablet (500 mg  total) by mouth every other day.     levothyroxine 200 MCG tablet  Commonly known as:  SYNTHROID, LEVOTHROID  Takes 200mcg tablet and 25 mcg tablet for total dose of 225mcg     levothyroxine 25 MCG tablet  Commonly known as:  SYNTHROID, LEVOTHROID  Takes 200mcg tablet and 25 mcg tablet for total dose of 225mcg     nitroGLYCERIN 0.4 MG SL tablet  Commonly known as:  NITROSTAT  Place 0.4 mg under the tongue every 5 (five) minutes x 3 doses as needed for chest pain.     phenytoin 100 MG ER capsule  Commonly known as:  DILANTIN  Take 2 capsules (200 mg total) by mouth at bedtime. 100 mg in the morning and 300 mg at night     potassium chloride 10 MEQ tablet  Commonly known as:  K-DUR  Take 20 mEq by mouth daily.     promethazine 25 MG tablet  Commonly known as:  PHENERGAN  Take 1 tablet by mouth daily as needed for nausea.     spironolactone 12.5 mg Tabs tablet  Commonly known as:  ALDACTONE  Take 0.5 tablets (12.5 mg total) by mouth daily.      tamsulosin 0.4 MG Caps capsule  Commonly known as:  FLOMAX  Take 0.4 mg by mouth 2 (two) times daily.        Discharged Condition: Improved    Consults: Cardiology  Significant Diagnostic Studies: Dg Chest 2 View  08/19/2013   CLINICAL DATA:  Chest pain  EXAM: CHEST  2 VIEW  COMPARISON:  08/12/2012  FINDINGS: Status post CABG. Mild hyperinflation. Mild interstitial prominence bilaterally suspicious for mild interstitial edema. No segmental infiltrate. Mild basilar atelectasis.  IMPRESSION: Mild hyperinflation. Mild interstitial prominence bilaterally suspicious for mild edema. No segmental infiltrate.   Electronically Signed   By: Natasha MeadLiviu  Pop M.D.   On: 08/19/2013 16:56   Dg Chest Port 1 View  08/22/2013   CLINICAL DATA:  Shortness of Breath  EXAM: PORTABLE CHEST - 1 VIEW  COMPARISON:  August 19, 2013  FINDINGS: There is generalized interstitial edema and cardiomegaly. There is pulmonary venous hypertension. There is airspace consolidation in the bases as well. No adenopathy. Patient is status post median sternotomy.  IMPRESSION: Congestive heart failure. Cannot exclude superimposed pneumonia in the bases.   Electronically Signed   By: Bretta BangWilliam  Woodruff M.D.   On: 08/22/2013 20:54    Lab Results: Basic Metabolic Panel:  Recent Labs  62/13/0804/20/15 0601 08/25/13 0610  NA 141 141  K 3.7 3.6*  CL 99 100  CO2 29 28  GLUCOSE 172* 138*  BUN 77* 84*  CREATININE 2.33* 2.44*  CALCIUM 9.5 9.6   Liver Function Tests:  Recent Labs  08/24/13 0601  AST 24  ALT 11  ALKPHOS 21*  BILITOT 0.5  PROT 6.6  ALBUMIN 2.5*     CBC:  Recent Labs  08/22/13 2031  WBC 9.0  NEUTROABS 7.4  HGB 11.5*  HCT 35.5*  MCV 97.5  PLT 162    No results found for this or any previous visit (from the past 240 hour(s)).   Hospital Course: He was admitted with increasing shortness of breath and chest pain. He appear to be having unstable angina pectoris. He was treated with nitroglycerin isosorbide  and Lasix and improved. He did have what appeared to be acute on chronic systolic congestive heart failure as well. Cardiology consultation was obtained. Initially he said that he would agree to do  some sort of invasive evaluation but then later changed his mind. He had some trouble with being confused during his hospitalization but that has resolved. His renal function is worse we may have to accept worsened renal function to keep him out of heart failure. He had a chest x-ray that was suggestive of pneumonia and is going to be treated for that. By the time of discharge she was much more alert and awake felt better and had no chest pain  Discharge Exam: Blood pressure 113/55, pulse 56, temperature 98.2 F (36.8 C), temperature source Oral, resp. rate 20, height 5\' 9"  (1.753 m), weight 101.2 kg (223 lb 1.7 oz), SpO2 98.00%. He is awake and alert. His chest is clear. His heart is regular.  Disposition: Discharge home with home health      Discharge Orders   Future Orders Complete By Expires   DME Hospital bed  As directed    Questions:     Patient has (list medical condition):  chf/copd   The above medical condition requires body part(s) to be positioned in ways not feasible with a normal bed: (list body part(s)):  chest   Head must be elevated at least:  30 degrees   Bed type:  Semi-electric   Face-to-face encounter (required for Medicare/Medicaid patients)  As directed    Questions:     The encounter with the patient was in whole, or in part, for the following medical condition, which is the primary reason for home health care:  /pneumonia/angina/chf   I certify that, based on my findings, the following services are medically necessary home health services:  Nursing   Physical therapy   My clinical findings support the need for the above services:  Shortness of breath with activity   Further, I certify that my clinical findings support that this patient is homebound due to:  Shortness of  Breath with activity   Reason for Medically Necessary Home Health Services:  Skilled Nursing- Change/Decline in Patient Status   Home Health  As directed    Scheduling Instructions:   BMET Thursday 08/27/2013. Call to DR. fagan if creatinine greater than 2.6   Questions:     To provide the following care/treatments:  PT   RN      Follow-up Information   Follow up with [facility]. (resume [facility] RN)    Contact information:   [address] [place] [address] [phone]      Follow up with Fredirick Maudlin, MD.   Specialty:  Pulmonary Disease   Contact information:   [address] [place] [address] [phone]       Signed: Fredirick Maudlin   08/25/2013, 9:07 AM

## 2013-08-25 NOTE — Progress Notes (Signed)
Subjective: He feels much better. He has no new complaints. He is ready for discharge. His renal function has not returned to baseline and I'm concerned that it may not  Objective: Vital signs in last 24 hours: Temp:  [98 F (36.7 C)] 98 F (36.7 C) (04/20 1500) Pulse Rate:  [54] 54 (04/20 1500) Resp:  [20] 20 (04/20 1500) BP: (123)/(65) 123/65 mmHg (04/20 1500) SpO2:  [98 %] 98 % (04/20 1500) Weight:  [101.2 kg (223 lb 1.7 oz)] 101.2 kg (223 lb 1.7 oz) (04/21 0708) Weight change:  Last BM Date: 08/23/13  Intake/Output from previous day: 04/20 0701 - 04/21 0700 In: 50 [IV Piggyback:50] Out: 950 [Urine:950]  PHYSICAL EXAM General appearance: alert, cooperative, no distress and moderately obese Resp: clear to auscultation bilaterally Cardio: regular rate and rhythm, S1, S2 normal, no murmur, click, rub or gallop GI: soft, non-tender; bowel sounds normal; no masses,  no organomegaly Extremities: His legs look much better with no edema and the burns are better  Lab Results:    Basic Metabolic Panel:  Recent Labs  78/29/5604/20/15 0601 08/25/13 0610  NA 141 141  K 3.7 3.6*  CL 99 100  CO2 29 28  GLUCOSE 172* 138*  BUN 77* 84*  CREATININE 2.33* 2.44*  CALCIUM 9.5 9.6   Liver Function Tests:  Recent Labs  08/24/13 0601  AST 24  ALT 11  ALKPHOS 21*  BILITOT 0.5  PROT 6.6  ALBUMIN 2.5*   No results found for this basename: LIPASE, AMYLASE,  in the last 72 hours No results found for this basename: AMMONIA,  in the last 72 hours CBC:  Recent Labs  08/22/13 2031  WBC 9.0  NEUTROABS 7.4  HGB 11.5*  HCT 35.5*  MCV 97.5  PLT 162   Cardiac Enzymes: No results found for this basename: CKTOTAL, CKMB, CKMBINDEX, TROPONINI,  in the last 72 hours BNP: No results found for this basename: PROBNP,  in the last 72 hours D-Dimer: No results found for this basename: DDIMER,  in the last 72 hours CBG:  Recent Labs  08/23/13 2202 08/24/13 0800 08/24/13 1222  08/24/13 1656 08/24/13 2107 08/25/13 0725  GLUCAP 170* 163* 138* 135* 123* 128*   Hemoglobin A1C: No results found for this basename: HGBA1C,  in the last 72 hours Fasting Lipid Panel: No results found for this basename: CHOL, HDL, LDLCALC, TRIG, CHOLHDL, LDLDIRECT,  in the last 72 hours Thyroid Function Tests: No results found for this basename: TSH, T4TOTAL, FREET4, T3FREE, THYROIDAB,  in the last 72 hours Anemia Panel: No results found for this basename: VITAMINB12, FOLATE, FERRITIN, TIBC, IRON, RETICCTPCT,  in the last 72 hours Coagulation: No results found for this basename: LABPROT, INR,  in the last 72 hours Urine Drug Screen: Drugs of Abuse  No results found for this basename: labopia, cocainscrnur, labbenz, amphetmu, thcu, labbarb    Alcohol Level: No results found for this basename: ETH,  in the last 72 hours Urinalysis: No results found for this basename: COLORURINE, APPERANCEUR, LABSPEC, PHURINE, GLUCOSEU, HGBUR, BILIRUBINUR, KETONESUR, PROTEINUR, UROBILINOGEN, NITRITE, LEUKOCYTESUR,  in the last 72 hours Misc. Labs:  ABGS No results found for this basename: PHART, PCO2, PO2ART, TCO2, HCO3,  in the last 72 hours CULTURES No results found for this or any previous visit (from the past 240 hour(s)). Studies/Results: No results found.  Medications:  Prior to Admission:  Prescriptions prior to admission  Medication Sig Dispense Refill  . allopurinol (ZYLOPRIM) 300 MG tablet Take 300 mg  by mouth every morning.       Marland Kitchen. ALPRAZolam (XANAX) 0.5 MG tablet Take 0.5 mg by mouth 3 (three) times daily as needed. For anxiety      . amiodarone (PACERONE) 200 MG tablet Take 1 tablet (200 mg total) by mouth daily.  60 tablet  6  . amLODipine (NORVASC) 5 MG tablet Take 5 mg by mouth every morning.       Marland Kitchen. atorvastatin (LIPITOR) 80 MG tablet Take 40 mg by mouth at bedtime.       . carvedilol (COREG) 25 MG tablet Take 25 mg by mouth 2 (two) times daily with a meal.      .  ciprofloxacin (CILOXAN) 0.3 % ophthalmic solution Place 1 drop into both eyes 2 (two) times daily. *ONLY USES AS NEEDED FOR FLARE*      . dipyridamole-aspirin (AGGRENOX) 25-200 MG per 12 hr capsule Take 1 capsule by mouth 2 (two) times daily.      . fenofibrate 160 MG tablet Take 160 mg by mouth every morning.       Marland Kitchen. glimepiride (AMARYL) 4 MG tablet Take 4 mg by mouth 2 (two) times daily with a meal.       . hydrALAZINE (APRESOLINE) 50 MG tablet Take 50 mg by mouth 4 (four) times daily.      Marland Kitchen. HYDROcodone-acetaminophen (NORCO/VICODIN) 5-325 MG per tablet Take 1 tablet by mouth every 6 (six) hours as needed for pain.      Marland Kitchen. insulin glargine (LANTUS) 100 UNIT/ML injection Inject 25 Units into the skin at bedtime.      . levETIRAcetam (KEPPRA) 500 MG tablet Take 500 mg by mouth 2 (two) times daily.      Marland Kitchen. levothyroxine (SYNTHROID, LEVOTHROID) 200 MCG tablet Takes 200mcg tablet and 25 mcg tablet for total dose of 225mcg      . levothyroxine (SYNTHROID, LEVOTHROID) 25 MCG tablet Takes 200mcg tablet and 25 mcg tablet for total dose of 225mcg      . losartan (COZAAR) 25 MG tablet Take 25 mg by mouth daily.      . metolazone (ZAROXOLYN) 5 MG tablet Take 5 mg by mouth daily. 5 day course starting at 08/18/2013      . potassium chloride (K-DUR) 10 MEQ tablet Take 20 mEq by mouth daily.      . promethazine (PHENERGAN) 25 MG tablet Take 1 tablet by mouth daily as needed for nausea.       Marland Kitchen. spironolactone (ALDACTONE) 12.5 mg TABS Take 0.5 tablets (12.5 mg total) by mouth daily.  30 tablet  12  . Tamsulosin HCl (FLOMAX) 0.4 MG CAPS Take 0.4 mg by mouth 2 (two) times daily.      . [DISCONTINUED] furosemide (LASIX) 40 MG tablet Take 40 mg by mouth daily. May take twice daily if needed      . [DISCONTINUED] phenytoin (DILANTIN) 100 MG ER capsule Take 100-300 mg by mouth 2 (two) times daily. 100 mg in the morning and 300 mg at night      . nitroGLYCERIN (NITROSTAT) 0.4 MG SL tablet Place 0.4 mg under the tongue  every 5 (five) minutes x 3 doses as needed for chest pain.       Scheduled: . allopurinol  300 mg Oral q morning - 10a  . amiodarone  200 mg Oral Daily  . amLODipine  5 mg Oral q morning - 10a  . aspirin EC  81 mg Oral Daily  . atorvastatin  40 mg Oral QHS  .  carvedilol  25 mg Oral BID WC  . dipyridamole-aspirin  1 capsule Oral BID  . doxycycline  100 mg Oral Q12H  . fenofibrate  160 mg Oral q morning - 10a  . glimepiride  4 mg Oral BID WC  . heparin  5,000 Units Subcutaneous Q8H  . hydrALAZINE  50 mg Oral 4 times per day  . insulin aspart  0-15 Units Subcutaneous TID WC  . insulin aspart  0-5 Units Subcutaneous QHS  . insulin glargine  25 Units Subcutaneous QHS  . isosorbide mononitrate  30 mg Oral Daily  . levETIRAcetam  500 mg Oral BID  . levothyroxine  200 mcg Oral QAC breakfast  . levothyroxine  25 mcg Oral QAC breakfast  . phenytoin  100 mg Oral Daily  . phenytoin  300 mg Oral QHS  . piperacillin-tazobactam (ZOSYN)  IV  3.375 g Intravenous 3 times per day  . potassium chloride  20 mEq Oral BID  . sodium chloride  3 mL Intravenous Q12H  . spironolactone  12.5 mg Oral Daily  . tamsulosin  0.4 mg Oral BID  . vancomycin  1,500 mg Intravenous Q24H   Continuous:  AVW:UJWJXBJYNW, guaiFENesin-dextromethorphan, HYDROcodone-acetaminophen, ipratropium-albuterol, LORazepam, nitroGLYCERIN, ondansetron (ZOFRAN) IV, ondansetron, polyethylene glycol  Assesment: He was admitted with acute coronary syndrome and acute on chronic systolic heart failure. He has not had chest pain several days. His heart failure is much better. There is a question about pneumonia and that is much better he has diabetes which is stable. He has chronic renal failure and I think were going to have to accept a higher creatinine to keep him out of heart failure Principal Problem:   Acute on chronic systolic heart failure Active Problems:   HYPOTHYROIDISM   HYPERTENSION   Arteriosclerotic cardiovascular disease  (ASCVD)   Diabetes mellitus, type 2   Cerebrovascular disease   Gastritis   Ischemic heart disease   CHF (congestive heart failure)   Acute on chronic combined systolic and diastolic CHF, NYHA class 3   Unstable angina   Acute on chronic renal failure   HAP (hospital-acquired pneumonia)   Dementia with behavioral disturbance   Chronic hypoxemic respiratory failure    Plan: Discharge home today    LOS: 6 days   Donald Owen 08/25/2013, 8:48 AM

## 2013-08-25 NOTE — Progress Notes (Signed)
ANTIBIOTIC CONSULT NOTE - follow up  Pharmacy Consult for Vancomycin and Zosyn  Indication: pneumonia  No Known Allergies  Patient Measurements: Height: 5\' 9"  (175.3 cm) Weight: 223 lb 1.7 oz (101.2 kg) IBW/kg (Calculated) : 70.7  Vital Signs:   Intake/Output from previous day: 04/20 0701 - 04/21 0700 In: 50 [IV Piggyback:50] Out: 950 [Urine:950] Intake/Output from this shift: Total I/O In: -  Out: 350 [Urine:350]  Labs:  Recent Labs  08/22/13 2031 08/23/13 0530 08/24/13 0601 08/25/13 0610  WBC 9.0  --   --   --   HGB 11.5*  --   --   --   PLT 162  --   --   --   CREATININE 2.02* 1.98* 2.33* 2.44*   Estimated Creatinine Clearance: 29.7 ml/min (by C-G formula based on Cr of 2.44). No results found for this basename: VANCOTROUGH, VANCOPEAK, VANCORANDOM, GENTTROUGH, GENTPEAK, GENTRANDOM, TOBRATROUGH, TOBRAPEAK, TOBRARND, AMIKACINPEAK, AMIKACINTROU, AMIKACIN,  in the last 72 hours   Microbiology: No results found for this or any previous visit (from the past 720 hour(s)).  Medical History: Past Medical History  Diagnosis Date  . Coronary atherosclerosis of native coronary artery 2000    CABG 2000 following acute MI - refused followup cardiac catheterization 2012  . Gout   . Hypothyroidism   . Seizures   . Ventricular tachycardia     On amiodarone  . Diabetes mellitus, type 2   . Cerebrovascular disease 2004    Left cerebral CVA [place] by carotid endarte[place] in [place]  . Essential hypertension, benign   . Gastritis 2004    Gastric erosions  . Hyperlipidemia   . Ischemic cardiomyopathy     LVEF 45% 2013   Medications:  Scheduled:  . allopurinol  300 mg Oral q morning - 10a  . amiodarone  200 mg Oral Daily  . amLODipine  5 mg Oral q morning - 10a  . aspirin EC  81 mg Oral Daily  . atorvastatin  40 mg Oral QHS  . carvedilol  25 mg Oral BID WC  . dipyridamole-aspirin  1 capsule Oral BID  . doxycycline  100 mg Oral Q12H  . fenofibrate  160 mg Oral  q morning - 10a  . glimepiride  4 mg Oral BID WC  . heparin  5,000 Units Subcutaneous Q8H  . hydrALAZINE  50 mg Oral 4 times per day  . insulin aspart  0-15 Units Subcutaneous TID WC  . insulin aspart  0-5 Units Subcutaneous QHS  . insulin glargine  25 Units Subcutaneous QHS  . isosorbide mononitrate  30 mg Oral Daily  . levETIRAcetam  500 mg Oral BID  . levothyroxine  200 mcg Oral QAC breakfast  . levothyroxine  25 mcg Oral QAC breakfast  . phenytoin  100 mg Oral Daily  . phenytoin  300 mg Oral QHS  . piperacillin-tazobactam (ZOSYN)  IV  3.375 g Intravenous 3 times per day  . potassium chloride  20 mEq Oral BID  . sodium chloride  3 mL Intravenous Q12H  . spironolactone  12.5 mg Oral Daily  . tamsulosin  0.4 mg Oral BID  . vancomycin  1,500 mg Intravenous Q24H   Assessment: Okay for Protocol, fever and increased sputum per RN currently on Doxycycline PO.  Obesity/Normalized CrCl dosing will be used for Vancomycin dosing.  SCr continues to rise gradually.   Estimated Normalized CrCl = 25-30 ml/min.  Vancomycin 4/18 >> Zosyn 4/17 >> Doxycycline PO 4/17 >>  Goal of Therapy:  Vancomycin trough level 15-20 mcg/ml  Plan:  Continue Zosyn 3.375gm IV every 8 hours. Continue Doxycycline 100mg  PO BID Follow-up micro data, labs, and renal fxn Vancomycin 1500mg  IV every 24 hours. Check trough level tonight before dose  KeySpanScott A Misbah Hornaday 08/25/2013,8:41 AM

## 2013-08-25 NOTE — Clinical Social Work Note (Signed)
EMS asked to transport patient home - [address] [place].    Santa GeneraAnne Kendrea Cerritos, LCSW Clinical Social Worker [phone])

## 2013-08-25 NOTE — Progress Notes (Signed)
Patient discharged home with instructions given on medications,and follow up visits,patient's daughter verbalized Donald Charunderstanding,who is one of the caretakers.Prescriptions were sent to Pharmacy of choice, documented AVS. No c/o pain or discomfort noted.Transported by EMS to home.[facility] to follow up with patient.

## 2013-11-11 ENCOUNTER — Inpatient Hospital Stay (HOSPITAL_COMMUNITY)
Admission: EM | Admit: 2013-11-11 | Discharge: 2013-11-18 | DRG: 291 | Disposition: A | Discharge status: Home Health | Payer: Medicare Other | Attending: Pulmonary Disease | Admitting: Pulmonary Disease

## 2013-11-11 ENCOUNTER — Emergency Department (HOSPITAL_COMMUNITY): Payer: Medicare Other

## 2013-11-11 ENCOUNTER — Encounter (HOSPITAL_COMMUNITY): Payer: Self-pay | Admitting: Emergency Medicine

## 2013-11-11 DIAGNOSIS — I1310 Hypertensive heart and chronic kidney disease without heart failure, with stage 1 through stage 4 chronic kidney disease, or unspecified chronic kidney disease: Secondary | ICD-10-CM

## 2013-11-11 DIAGNOSIS — E039 Hypothyroidism, unspecified: Secondary | ICD-10-CM | POA: Diagnosis present

## 2013-11-11 DIAGNOSIS — F03918 Unspecified dementia, unspecified severity, with other behavioral disturbance: Secondary | ICD-10-CM | POA: Diagnosis present

## 2013-11-11 DIAGNOSIS — I25812 Atherosclerosis of bypass graft of coronary artery of transplanted heart without angina pectoris: Secondary | ICD-10-CM

## 2013-11-11 DIAGNOSIS — E1121 Type 2 diabetes mellitus with diabetic nephropathy: Secondary | ICD-10-CM

## 2013-11-11 DIAGNOSIS — N183 Chronic kidney disease, stage 3 unspecified: Secondary | ICD-10-CM | POA: Diagnosis present

## 2013-11-11 DIAGNOSIS — I5043 Acute on chronic combined systolic (congestive) and diastolic (congestive) heart failure: Principal | ICD-10-CM | POA: Diagnosis present

## 2013-11-11 DIAGNOSIS — E1122 Type 2 diabetes mellitus with diabetic chronic kidney disease: Secondary | ICD-10-CM

## 2013-11-11 DIAGNOSIS — Z8249 Family history of ischemic heart disease and other diseases of the circulatory system: Secondary | ICD-10-CM

## 2013-11-11 DIAGNOSIS — R0902 Hypoxemia: Secondary | ICD-10-CM

## 2013-11-11 DIAGNOSIS — N179 Acute kidney failure, unspecified: Secondary | ICD-10-CM

## 2013-11-11 DIAGNOSIS — R000 Tachycardia, unspecified: Secondary | ICD-10-CM | POA: Diagnosis present

## 2013-11-11 DIAGNOSIS — G9341 Metabolic encephalopathy: Secondary | ICD-10-CM | POA: Diagnosis present

## 2013-11-11 DIAGNOSIS — Z91199 Patient's noncompliance with other medical treatment and regimen due to unspecified reason: Secondary | ICD-10-CM

## 2013-11-11 DIAGNOSIS — Z951 Presence of aortocoronary bypass graft: Secondary | ICD-10-CM

## 2013-11-11 DIAGNOSIS — I129 Hypertensive chronic kidney disease with stage 1 through stage 4 chronic kidney disease, or unspecified chronic kidney disease: Secondary | ICD-10-CM | POA: Diagnosis present

## 2013-11-11 DIAGNOSIS — I679 Cerebrovascular disease, unspecified: Secondary | ICD-10-CM

## 2013-11-11 DIAGNOSIS — F0391 Unspecified dementia with behavioral disturbance: Secondary | ICD-10-CM | POA: Diagnosis present

## 2013-11-11 DIAGNOSIS — I252 Old myocardial infarction: Secondary | ICD-10-CM

## 2013-11-11 DIAGNOSIS — J440 Chronic obstructive pulmonary disease with acute lower respiratory infection: Secondary | ICD-10-CM | POA: Diagnosis present

## 2013-11-11 DIAGNOSIS — E1169 Type 2 diabetes mellitus with other specified complication: Secondary | ICD-10-CM | POA: Diagnosis present

## 2013-11-11 DIAGNOSIS — I2510 Atherosclerotic heart disease of native coronary artery without angina pectoris: Secondary | ICD-10-CM | POA: Diagnosis present

## 2013-11-11 DIAGNOSIS — J9610 Chronic respiratory failure, unspecified whether with hypoxia or hypercapnia: Secondary | ICD-10-CM

## 2013-11-11 DIAGNOSIS — Z794 Long term (current) use of insulin: Secondary | ICD-10-CM

## 2013-11-11 DIAGNOSIS — I10 Essential (primary) hypertension: Secondary | ICD-10-CM | POA: Diagnosis present

## 2013-11-11 DIAGNOSIS — E785 Hyperlipidemia, unspecified: Secondary | ICD-10-CM | POA: Diagnosis present

## 2013-11-11 DIAGNOSIS — G40909 Epilepsy, unspecified, not intractable, without status epilepticus: Secondary | ICD-10-CM | POA: Diagnosis present

## 2013-11-11 DIAGNOSIS — I509 Heart failure, unspecified: Secondary | ICD-10-CM | POA: Diagnosis present

## 2013-11-11 DIAGNOSIS — Z66 Do not resuscitate: Secondary | ICD-10-CM | POA: Diagnosis present

## 2013-11-11 DIAGNOSIS — E119 Type 2 diabetes mellitus without complications: Secondary | ICD-10-CM | POA: Diagnosis present

## 2013-11-11 DIAGNOSIS — N189 Chronic kidney disease, unspecified: Secondary | ICD-10-CM

## 2013-11-11 DIAGNOSIS — J209 Acute bronchitis, unspecified: Secondary | ICD-10-CM | POA: Diagnosis present

## 2013-11-11 DIAGNOSIS — I2589 Other forms of chronic ischemic heart disease: Secondary | ICD-10-CM | POA: Diagnosis present

## 2013-11-11 DIAGNOSIS — I452 Bifascicular block: Secondary | ICD-10-CM

## 2013-11-11 DIAGNOSIS — I259 Chronic ischemic heart disease, unspecified: Secondary | ICD-10-CM

## 2013-11-11 DIAGNOSIS — J9611 Chronic respiratory failure with hypoxia: Secondary | ICD-10-CM | POA: Diagnosis present

## 2013-11-11 DIAGNOSIS — I69959 Hemiplegia and hemiparesis following unspecified cerebrovascular disease affecting unspecified side: Secondary | ICD-10-CM

## 2013-11-11 DIAGNOSIS — Z9981 Dependence on supplemental oxygen: Secondary | ICD-10-CM

## 2013-11-11 DIAGNOSIS — Z87891 Personal history of nicotine dependence: Secondary | ICD-10-CM

## 2013-11-11 DIAGNOSIS — Z9119 Patient's noncompliance with other medical treatment and regimen: Secondary | ICD-10-CM

## 2013-11-11 DIAGNOSIS — I472 Ventricular tachycardia: Secondary | ICD-10-CM | POA: Diagnosis present

## 2013-11-11 LAB — COMPREHENSIVE METABOLIC PANEL
ALK PHOS: 18 U/L — AB (ref 39–117)
ALT: 12 U/L (ref 0–53)
ANION GAP: 12 (ref 5–15)
AST: 17 U/L (ref 0–37)
Albumin: 3.5 g/dL (ref 3.5–5.2)
BUN: 66 mg/dL — AB (ref 6–23)
CO2: 28 mEq/L (ref 19–32)
CREATININE: 2.31 mg/dL — AB (ref 0.50–1.35)
Calcium: 9.8 mg/dL (ref 8.4–10.5)
Chloride: 104 mEq/L (ref 96–112)
GFR calc non Af Amer: 26 mL/min — ABNORMAL LOW (ref >90)
GFR, EST AFRICAN AMERICAN: 30 mL/min — AB (ref >90)
GLUCOSE: 157 mg/dL — AB (ref 70–99)
Potassium: 4.7 mEq/L (ref 3.7–5.3)
Sodium: 144 mEq/L (ref 137–147)
Total Bilirubin: 0.4 mg/dL (ref 0.3–1.2)
Total Protein: 7.5 g/dL (ref 6.0–8.3)

## 2013-11-11 LAB — PROTIME-INR
INR: 1.17 (ref 0.00–1.49)
PROTHROMBIN TIME: 14.9 s (ref 11.6–15.2)

## 2013-11-11 LAB — CBC
HCT: 38.1 % — ABNORMAL LOW (ref 39.0–52.0)
HEMOGLOBIN: 12.3 g/dL — AB (ref 13.0–17.0)
MCH: 30.8 pg (ref 26.0–34.0)
MCHC: 32.3 g/dL (ref 30.0–36.0)
MCV: 95.3 fL (ref 78.0–100.0)
Platelets: 151 10*3/uL (ref 150–400)
RBC: 4 MIL/uL — ABNORMAL LOW (ref 4.22–5.81)
RDW: 18.9 % — AB (ref 11.5–15.5)
WBC: 5.6 10*3/uL (ref 4.0–10.5)

## 2013-11-11 LAB — PRO B NATRIURETIC PEPTIDE: Pro B Natriuretic peptide (BNP): 2652 pg/mL — ABNORMAL HIGH (ref 0–450)

## 2013-11-11 LAB — GLUCOSE, CAPILLARY
Glucose-Capillary: 149 mg/dL — ABNORMAL HIGH (ref 70–99)
Glucose-Capillary: 145 mg/dL — ABNORMAL HIGH (ref 70–99)

## 2013-11-11 LAB — TROPONIN I
Troponin I: <0.3 ng/mL (ref <0.30)
Troponin I: <0.3 ng/mL (ref <0.30)
Troponin I: <0.3 ng/mL (ref <0.30)

## 2013-11-11 LAB — I-STAT CG4 LACTIC ACID, ED: LACTIC ACID, VENOUS: 0.48 mmol/L — AB (ref 0.5–2.2)

## 2013-11-11 LAB — CBG MONITORING, ED: Glucose-Capillary: 140 mg/dL — ABNORMAL HIGH (ref 70–99)

## 2013-11-11 LAB — PHENYTOIN LEVEL, TOTAL: Phenytoin Lvl: 25.9 ug/mL — ABNORMAL HIGH (ref 10.0–20.0)

## 2013-11-11 MED ORDER — ONDANSETRON HCL 4 MG/2ML IJ SOLN: 4.0000 mg | Freq: Four times a day (QID) | INTRAMUSCULAR | Status: DC | PRN

## 2013-11-11 MED ORDER — FUROSEMIDE 10 MG/ML IJ SOLN
40.0000 mg | Freq: Two times a day (BID) | INTRAMUSCULAR | Status: DC
  Administered 2013-11-11 – 2013-11-16 (×10): 40 mg via INTRAVENOUS
  Filled 2013-11-11 (×10): qty 4

## 2013-11-11 MED ORDER — FENOFIBRATE 160 MG PO TABS
160.0000 mg | ORAL_TABLET | Freq: Every morning | ORAL | Status: DC
  Administered 2013-11-13 – 2013-11-18 (×6): 160 mg via ORAL
  Filled 2013-11-11 (×9): qty 1

## 2013-11-11 MED ORDER — NITROGLYCERIN 0.4 MG SL SUBL: 0.4000 mg | SUBLINGUAL_TABLET | SUBLINGUAL | Status: DC | PRN

## 2013-11-11 MED ORDER — GLIMEPIRIDE 2 MG PO TABS
4.0000 mg | ORAL_TABLET | Freq: Two times a day (BID) | ORAL | Status: DC
  Administered 2013-11-11 – 2013-11-12 (×2): 4 mg via ORAL
  Filled 2013-11-11 (×3): qty 2

## 2013-11-11 MED ORDER — LEVETIRACETAM 500 MG PO TABS
500.0000 mg | ORAL_TABLET | Freq: Two times a day (BID) | ORAL | Status: DC
  Administered 2013-11-11 – 2013-11-18 (×12): 500 mg via ORAL
  Filled 2013-11-11 (×14): qty 1

## 2013-11-11 MED ORDER — ACETAMINOPHEN 325 MG PO TABS: 650.0000 mg | ORAL_TABLET | ORAL | Status: DC | PRN

## 2013-11-11 MED ORDER — TAMSULOSIN HCL 0.4 MG PO CAPS
0.4000 mg | ORAL_CAPSULE | Freq: Two times a day (BID) | ORAL | Status: DC
  Administered 2013-11-11 – 2013-11-18 (×12): 0.4 mg via ORAL
  Filled 2013-11-11 (×14): qty 1

## 2013-11-11 MED ORDER — ISOSORBIDE MONONITRATE ER 30 MG PO TB24
30.0000 mg | ORAL_TABLET | Freq: Every day | ORAL | Status: DC
  Administered 2013-11-11 – 2013-11-16 (×5): 30 mg via ORAL
  Filled 2013-11-11 (×6): qty 1

## 2013-11-11 MED ORDER — PHENYTOIN SODIUM EXTENDED 100 MG PO CAPS: 100.0000 mg | ORAL_CAPSULE | Freq: Two times a day (BID) | ORAL | Status: DC

## 2013-11-11 MED ORDER — PHENYTOIN SODIUM EXTENDED 100 MG PO CAPS
300.0000 mg | ORAL_CAPSULE | Freq: Every day | ORAL | Status: DC
  Administered 2013-11-11: 300 mg via ORAL
  Filled 2013-11-11 (×2): qty 3

## 2013-11-11 MED ORDER — AMLODIPINE BESYLATE 5 MG PO TABS
5.0000 mg | ORAL_TABLET | Freq: Every morning | ORAL | Status: DC
  Administered 2013-11-13 – 2013-11-18 (×5): 5 mg via ORAL
  Filled 2013-11-11 (×7): qty 1

## 2013-11-11 MED ORDER — SODIUM CHLORIDE 0.9 % IV SOLN: 250.0000 mL | INTRAVENOUS | Status: DC | PRN

## 2013-11-11 MED ORDER — SODIUM CHLORIDE 0.9 % IJ SOLN
3.0000 mL | Freq: Two times a day (BID) | INTRAMUSCULAR | Status: DC
  Administered 2013-11-11 – 2013-11-17 (×7): 3 mL via INTRAVENOUS

## 2013-11-11 MED ORDER — ALPRAZOLAM 0.5 MG PO TABS
0.5000 mg | ORAL_TABLET | Freq: Three times a day (TID) | ORAL | Status: DC | PRN
  Administered 2013-11-14 – 2013-11-16 (×2): 0.5 mg via ORAL
  Filled 2013-11-11 (×3): qty 1

## 2013-11-11 MED ORDER — AMIODARONE HCL 200 MG PO TABS
200.0000 mg | ORAL_TABLET | Freq: Every day | ORAL | Status: DC
  Administered 2013-11-11 – 2013-11-18 (×7): 200 mg via ORAL
  Filled 2013-11-11 (×8): qty 1

## 2013-11-11 MED ORDER — ASPIRIN-DIPYRIDAMOLE ER 25-200 MG PO CP12
1.0000 | ORAL_CAPSULE | Freq: Two times a day (BID) | ORAL | Status: DC
  Administered 2013-11-11 – 2013-11-18 (×11): 1 via ORAL
  Filled 2013-11-11 (×18): qty 1

## 2013-11-11 MED ORDER — PHENYTOIN SODIUM EXTENDED 100 MG PO CAPS
100.0000 mg | ORAL_CAPSULE | Freq: Every day | ORAL | Status: DC
  Administered 2013-11-13: 100 mg via ORAL
  Filled 2013-11-11 (×2): qty 1

## 2013-11-11 MED ORDER — ALBUTEROL SULFATE (2.5 MG/3ML) 0.083% IN NEBU: 2.5000 mg | INHALATION_SOLUTION | RESPIRATORY_TRACT | Status: DC | PRN

## 2013-11-11 MED ORDER — ALLOPURINOL 300 MG PO TABS
300.0000 mg | ORAL_TABLET | Freq: Every morning | ORAL | Status: DC
  Administered 2013-11-13 – 2013-11-18 (×6): 300 mg via ORAL
  Filled 2013-11-11 (×7): qty 1

## 2013-11-11 MED ORDER — CARVEDILOL 12.5 MG PO TABS
25.0000 mg | ORAL_TABLET | Freq: Two times a day (BID) | ORAL | Status: DC
  Administered 2013-11-11 – 2013-11-16 (×8): 25 mg via ORAL
  Filled 2013-11-11 (×11): qty 2

## 2013-11-11 MED ORDER — HYDRALAZINE HCL 25 MG PO TABS
50.0000 mg | ORAL_TABLET | Freq: Four times a day (QID) | ORAL | Status: DC
  Administered 2013-11-11 – 2013-11-18 (×17): 50 mg via ORAL
  Filled 2013-11-11 (×20): qty 2

## 2013-11-11 MED ORDER — ATORVASTATIN CALCIUM 40 MG PO TABS
40.0000 mg | ORAL_TABLET | Freq: Every day | ORAL | Status: DC
  Administered 2013-11-11 – 2013-11-17 (×6): 40 mg via ORAL
  Filled 2013-11-11 (×7): qty 1

## 2013-11-11 MED ORDER — LEVOTHYROXINE SODIUM 25 MCG PO TABS
225.0000 ug | ORAL_TABLET | Freq: Every day | ORAL | Status: DC
  Administered 2013-11-13 – 2013-11-18 (×6): 225 ug via ORAL
  Filled 2013-11-11: qty 2
  Filled 2013-11-11 (×2): qty 1
  Filled 2013-11-11 (×2): qty 2
  Filled 2013-11-11 (×2): qty 1
  Filled 2013-11-11: qty 2
  Filled 2013-11-11 (×3): qty 1
  Filled 2013-11-11 (×3): qty 2

## 2013-11-11 MED ORDER — SODIUM CHLORIDE 0.9 % IJ SOLN
3.0000 mL | INTRAMUSCULAR | Status: DC | PRN
  Administered 2013-11-14: 3 mL via INTRAVENOUS

## 2013-11-11 MED ORDER — INSULIN ASPART 100 UNIT/ML ~~LOC~~ SOLN: 0.0000 [IU] | Freq: Three times a day (TID) | SUBCUTANEOUS | Status: DC

## 2013-11-11 MED ORDER — INSULIN GLARGINE 100 UNIT/ML ~~LOC~~ SOLN
25.0000 [IU] | Freq: Every day | SUBCUTANEOUS | Status: DC
  Administered 2013-11-11: 25 [IU] via SUBCUTANEOUS
  Filled 2013-11-11 (×2): qty 0.25

## 2013-11-11 MED ORDER — POTASSIUM CHLORIDE CRYS ER 10 MEQ PO TBCR
20.0000 meq | EXTENDED_RELEASE_TABLET | Freq: Three times a day (TID) | ORAL | Status: DC
  Administered 2013-11-11 – 2013-11-18 (×18): 20 meq via ORAL
  Filled 2013-11-11 (×20): qty 2

## 2013-11-11 MED ORDER — HEPARIN SODIUM (PORCINE) 5000 UNIT/ML IJ SOLN
5000.0000 [IU] | Freq: Three times a day (TID) | INTRAMUSCULAR | Status: DC
  Administered 2013-11-11 – 2013-11-18 (×17): 5000 [IU] via SUBCUTANEOUS
  Filled 2013-11-11 (×18): qty 1

## 2013-11-11 NOTE — ED Provider Notes (Signed)
CSN: [id number]     Arrival date & time 11/11/13  1325 History  This chart was scribed for Toy Baker, MD by Quintella Reichert, ED scribe.  This patient was seen in room APA05/APA05 and the patient's care was started at 2:00 PM.   Chief Complaint  Patient presents with  . Altered Mental Status    The history is provided by the patient and a relative. No language interpreter was used.    Level 5 Caveat: Confusion  HPI Comments: Donald Owen is a 80 y.o. male brought in by EMS to the Emergency Department complaining of AMS.  Pt was last seen normal by daughter this morning at 10:30 AM.  Daughter reports that pt then took a nap and woke up "not acting himself," with worsened grogginess from his baseline.  When they checked his sugar it was 72 so EMS was called.  When EMS arrived his sugar was 125 although he was hypoxic with oxygen saturation in the low 80s.  Daughter states he is supposed to be on oxygen at all times for an unknown reason, but he is noncompliant with this and takes off his oxygen whenever he starts feeling better.  EMS placed him on oxygen en route and daughter states he has improved significantly since then, and is now back at his baseline.  Pt denies HA, CP, or SOB.  Daughter denies recent cough or fever.  She denies pt having prior h/o similar episodes.  However she does note that at pt's baseline it is not at all unusual for him to be disoriented to person, place, and/or time.  He walks with a walker.  She denies recent falls.   Past Medical History  Diagnosis Date  . Coronary atherosclerosis of native coronary artery 2000    CABG 2000 following acute MI - refused followup cardiac catheterization 2012  . Gout   . Hypothyroidism   . Seizures   . Ventricular tachycardia     On amiodarone  . Diabetes mellitus, type 2   . Cerebrovascular disease 2004    Left cerebral CVA followed by carotid endarterectomy in [place]  . Essential hypertension, benign   . Gastritis  2004    Gastric erosions  . Hyperlipidemia   . Ischemic cardiomyopathy     LVEF 45% 2013    Past Surgical History  Procedure Laterality Date  . Coronary artery bypass graft  04/1999    Dr. Barry Dienes  . Carotid endarterectomy    . Cholecystectomy      Family History  Problem Relation Age of Onset  . Heart disease Father     History  Substance Use Topics  . Smoking status: Former Smoker -- 1.50 packs/day for 40 years    Types: Cigarettes    Quit date: 04/08/1999  . Smokeless tobacco: Never Used     Comment: quit 1999  . Alcohol Use: No     Review of Systems  Unable to perform ROS: Other  (confusion)    Allergies  Review of patient's allergies indicates no known allergies.  Home Medications   Prior to Admission medications   Medication Sig Start Date End Date Taking? Authorizing Provider  allopurinol (ZYLOPRIM) 300 MG tablet Take 300 mg by mouth every morning.     Historical Provider, MD  ALPRAZolam Prudy Feeler) 0.5 MG tablet Take 0.5 mg by mouth 3 (three) times daily as needed. For anxiety    Historical Provider, MD  amiodarone (PACERONE) 200 MG tablet Take 1 tablet (200 mg  total) by mouth daily. 02/15/12 08/19/13  Marinus MawGregg W Taylor, MD  amLODipine (NORVASC) 5 MG tablet Take 5 mg by mouth every morning.     Historical Provider, MD  atorvastatin (LIPITOR) 80 MG tablet Take 40 mg by mouth at bedtime.     Historical Provider, MD  carvedilol (COREG) 25 MG tablet Take 25 mg by mouth 2 (two) times daily with a meal.    Historical Provider, MD  ciprofloxacin (CILOXAN) 0.3 % ophthalmic solution Place 1 drop into both eyes 2 (two) times daily. *ONLY USES AS NEEDED FOR FLARE* 03/09/13   Historical Provider, MD  dipyridamole-aspirin (AGGRENOX) 25-200 MG per 12 hr capsule Take 1 capsule by mouth 2 (two) times daily.    Historical Provider, MD  fenofibrate 160 MG tablet Take 160 mg by mouth every morning.     Historical Provider, MD  furosemide (LASIX) 40 MG tablet Take 1 tablet (40 mg total)  by mouth daily. May take twice daily if needed 08/25/13   Fredirick MaudlinEdward L Hawkins, MD  glimepiride (AMARYL) 4 MG tablet Take 4 mg by mouth 2 (two) times daily with a meal.     Historical Provider, MD  hydrALAZINE (APRESOLINE) 50 MG tablet Take 50 mg by mouth 4 (four) times daily.    Historical Provider, MD  HYDROcodone-acetaminophen (NORCO/VICODIN) 5-325 MG per tablet Take 1 tablet by mouth every 6 (six) hours as needed for pain.    Historical Provider, MD  insulin glargine (LANTUS) 100 UNIT/ML injection Inject 25 Units into the skin at bedtime.    Historical Provider, MD  isosorbide mononitrate (IMDUR) 30 MG 24 hr tablet Take 1 tablet (30 mg total) by mouth daily. 08/25/13   Fredirick MaudlinEdward L Hawkins, MD  levETIRAcetam (KEPPRA) 500 MG tablet Take 500 mg by mouth 2 (two) times daily.    Historical Provider, MD  levofloxacin (LEVAQUIN) 500 MG tablet Take 1 tablet (500 mg total) by mouth every other day. 08/25/13   Fredirick MaudlinEdward L Hawkins, MD  levothyroxine (SYNTHROID, LEVOTHROID) 200 MCG tablet Takes 200mcg tablet and 25 mcg tablet for total dose of 225mcg    Historical Provider, MD  levothyroxine (SYNTHROID, LEVOTHROID) 25 MCG tablet Takes 200mcg tablet and 25 mcg tablet for total dose of 225mcg    Historical Provider, MD  nitroGLYCERIN (NITROSTAT) 0.4 MG SL tablet Place 0.4 mg under the tongue every 5 (five) minutes x 3 doses as needed for chest pain.    Historical Provider, MD  phenytoin (DILANTIN) 100 MG ER capsule Take 2 capsules (200 mg total) by mouth at bedtime. 100 mg in the morning and 300 mg at night 08/25/13   Fredirick MaudlinEdward L Hawkins, MD  potassium chloride (K-DUR) 10 MEQ tablet Take 20 mEq by mouth daily.    Historical Provider, MD  promethazine (PHENERGAN) 25 MG tablet Take 1 tablet by mouth daily as needed for nausea.  03/09/13   Historical Provider, MD  spironolactone (ALDACTONE) 12.5 mg TABS Take 0.5 tablets (12.5 mg total) by mouth daily. 07/17/12   Fredirick MaudlinEdward L Hawkins, MD  Tamsulosin HCl (FLOMAX) 0.4 MG CAPS Take 0.4 mg  by mouth 2 (two) times daily.    Historical Provider, MD   BP 171/57  Pulse 55  Temp(Src) 97.6 F (36.4 C) (Oral)  Resp 20  SpO2 91%  Physical Exam  Nursing note and vitals reviewed. Constitutional: He appears well-developed and well-nourished.  Non-toxic appearance. No distress.  HENT:  Head: Normocephalic and atraumatic.  Eyes: Conjunctivae, EOM and lids are normal. Pupils are equal, round,  and reactive to light.  Neck: Normal range of motion. Neck supple. No tracheal deviation present. No mass present.  Cardiovascular: Regular rhythm and normal heart sounds.  Bradycardia present.  Exam reveals no gallop.   No murmur heard. Pulmonary/Chest: Effort normal and breath sounds normal. No stridor. No respiratory distress. He has no decreased breath sounds. He has no wheezes. He has no rhonchi. He has no rales.  Abdominal: Soft. Normal appearance and bowel sounds are normal. He exhibits no distension. There is no tenderness. There is no rebound and no CVA tenderness.  Musculoskeletal: Normal range of motion. He exhibits no edema and no tenderness.  Neurological: He is alert. No cranial nerve deficit or sensory deficit. GCS eye subscore is 4. GCS verbal subscore is 5. GCS motor subscore is 6.  No facial asymmetry Strength 4/5 in all extremities Cerebellar function grossly intact Mental status exam at baseline according to daughter  Skin: Skin is warm and dry. No abrasion and no rash noted.  Psychiatric: He has a normal mood and affect. His speech is normal and behavior is normal.    ED Course  Procedures (including critical care time)  DIAGNOSTIC STUDIES: Oxygen Saturation is 91% on [place], low by my interpretation.    COORDINATION OF CARE: 2:06 PM-Discussed treatment plan which includes head CT, CXR, EKG, and labs with pt's daughter at bedside and she agreed to plan.     Labs Review Labs Reviewed  CBG MONITORING, ED - Abnormal; Notable for the following:    Glucose-Capillary 140  (*)    All other components within normal limits  CBC  COMPREHENSIVE METABOLIC PANEL  PROTIME-INR  URINALYSIS, ROUTINE W REFLEX MICROSCOPIC  I-STAT CG4 LACTIC ACID, ED    Imaging Review No results found.   EKG Interpretation   Date/Time:  Wednesday November 11 2013 13:56:07 EDT Ventricular Rate:  55 PR Interval:    QRS Duration: 195 QT Interval:  540 QTC Calculation: 517 R Axis:   126 Text Interpretation:  Junctional rhythm RBBB and LPFB ST depr, consider  ischemia, inferior leads No significant change since last tracing  Confirmed by Seraiah Nowack  MD, Tevyn Codd ([id number]) on 11/11/2013 2:30:17 PM      MDM   Final diagnoses:  None   Patient's chest x-ray and labs reviewed. Patient CHF at this time. Will give dose of Lasix here and admitted to medicine.  I personally performed the services described in this documentation, which was scribed in my presence. The recorded information has been reviewed and is accurate.   Toy BakerAnthony T Khylan Sawyer, MD 11/11/13 [phone]

## 2013-11-11 NOTE — ED Notes (Signed)
Pt awoke form a nap at an unknown time today and was not "acting himself" EMS did not ask last time normal. Pt is groggy and is oriented only to self on arrival to ED. Was not wearing O2 at home as prescribed and was found to have sats of "low 80's". CBG was 125. SB on EMS EKG. Denies chest pain and shortness of breath. Other vitals "normal" but not provided by EMS. PIV #20 in left AC.

## 2013-11-11 NOTE — H&P (Signed)
PATIENT DETAILS Name: Donald Owen Age: 80 y.o. Sex: male Date of Birth: 23-Dec-1933 Admit Date: 11/11/2013 UJW:JXBJYNW,GNFAOZPCP:HAWKINS,EDWARD L, MD   CHIEF COMPLAINT:  Shortness of breath  HPI: Donald Hugeron A Puls is a 80 y.o. male with a Past Medical History of chronic combined systolic and diastolic heart failure EF 45%, hypothyroidism, diabetes mellitus type 2 now insulin-dependent, hypertension, chronic kidney disease stage III,COPD on 2-1/2 L nasal cannula oxygen at home, CVA with very minimal right sided hemiparesis, hypertension  who presents today with the above noted complaint. Please note, patient has a history of dementia, and is a unreliable historian, most of this history is obtained after speaking with the patient's daughter at bedside. Apparently, this morning patient's daughter noticed that patient was not his usual self-she thought that the patient was labored and short of breath when walking. As a result EMS was called, patient was brought to the emergency room, where further evaluation was consistent with acute congestive heart failure. I was asked to admit this patient for further evaluation and treatment. Apparently, while at the field, EMS found that patient was hypoxic with his oxygen saturation to 80s. Patient apparently is supposed to be on 2 L of oxygen 24/7, however patient has been noncompliant. On my evaluation, patient seemed comfortable, per patient's daughter at bedside, current mental status is at baseline-she claims that ever since that the patient had a stroke in thousand and [place], patient has had issues with memory and has probably developed dementia.  ALLERGIES:  No Known Allergies  PAST MEDICAL HISTORY: Past Medical History  Diagnosis Date  . Coronary atherosclerosis of native coronary artery 2000    CABG 2000 following acute MI - refused followup cardiac catheterization 2012  . Gout   . Hypothyroidism   . Seizures   . Ventricular tachycardia     On amiodarone  .  Diabetes mellitus, type 2   . Cerebrovascular disease 2004    Left cerebral CVA followed [place] carotid endarte[place] in [place]  . Essential hypertension, benign   . Gastritis 2004    Gastric erosions  . Hyperlipidemia   . Ischemic cardiomyopathy     LVEF 45% 2013    PAST SURGICAL HISTORY: Past Surgical History  Procedure Laterality Date  . Coronary artery bypass graft  04/1999    Dr. Barry Dieneswens  . Carotid endarterectomy    . Cholecystectomy      MEDICATIONS AT HOME: Prior to Admission medications   Medication Sig Start Date End Date Taking? Authorizing Provider  allopurinol (ZYLOPRIM) 300 MG tablet Take 300 mg by mouth every morning.    Yes Historical Provider, MD  ALPRAZolam Prudy Feeler(XANAX) 0.5 MG tablet Take 0.5 mg by mouth 3 (three) times daily as needed. For anxiety   Yes Historical Provider, MD  amLODipine (NORVASC) 5 MG tablet Take 5 mg by mouth every morning.    Yes Historical Provider, MD  atorvastatin (LIPITOR) 80 MG tablet Take 40 mg by mouth at bedtime.    Yes Historical Provider, MD  carvedilol (COREG) 25 MG tablet Take 25 mg by mouth 2 (two) times daily with a meal.   Yes Historical Provider, MD  dipyridamole-aspirin (AGGRENOX) 25-200 MG per 12 hr capsule Take 1 capsule by mouth 2 (two) times daily.   Yes Historical Provider, MD  fenofibrate 160 MG tablet Take 160 mg by mouth every morning.    Yes Historical Provider, MD  furosemide (LASIX) 40 MG tablet Take 1 tablet (40 mg total) by  mouth daily. May take twice daily if needed 08/25/13  Yes Fredirick Maudlin, MD  glimepiride (AMARYL) 4 MG tablet Take 4 mg by mouth 2 (two) times daily with a meal.    Yes Historical Provider, MD  hydrALAZINE (APRESOLINE) 50 MG tablet Take 50 mg by mouth 4 (four) times daily.   Yes Historical Provider, MD  HYDROcodone-acetaminophen (NORCO/VICODIN) 5-325 MG per tablet Take 1 tablet by mouth every 6 (six) hours as needed for pain.   Yes Historical Provider, MD  insulin glargine (LANTUS) 100 UNIT/ML  injection Inject 25 Units into the skin at bedtime.   Yes Historical Provider, MD  isosorbide mononitrate (IMDUR) 30 MG 24 hr tablet Take 1 tablet (30 mg total) by mouth daily. 08/25/13  Yes Fredirick Maudlin, MD  levETIRAcetam (KEPPRA) 500 MG tablet Take 500 mg by mouth 2 (two) times daily.   Yes Historical Provider, MD  levothyroxine (SYNTHROID, LEVOTHROID) 200 MCG tablet Takes tablet and 25 mcg tablet for total dose of   Yes Historical Provider, MD  levothyroxine (SYNTHROID, LEVOTHROID) 25 MCG tablet Takes tablet and 25 mcg tablet for total dose of   Yes Historical Provider, MD  losartan (COZAAR) 25 MG tablet Take 25 mg by mouth daily. 10/20/13  Yes Historical Provider, MD  metolazone (ZAROXOLYN) 5 MG tablet Take 5 mg by mouth 2 (two) times daily as needed. Swelling   Yes Historical Provider, MD  phenytoin (DILANTIN) 100 MG ER capsule Take 100-300 mg by mouth 2 (two) times daily. Patient take 100 in the morning and 300 at night   Yes Historical Provider, MD  potassium chloride (K-DUR) 10 MEQ tablet Take 20 mEq by mouth 3 (three) times daily.    Yes Historical Provider, MD  spironolactone (ALDACTONE) 12.5 mg TABS Take 0.5 tablets (12.5 mg total) by mouth daily. 07/17/12  Yes Fredirick Maudlin, MD  Tamsulosin HCl (FLOMAX) 0.4 MG CAPS Take 0.4 mg by mouth 2 (two) times daily.   Yes Historical Provider, MD  amiodarone (PACERONE) 200 MG tablet Take 1 tablet (200 mg total) by mouth daily. 02/15/12 08/19/13  Marinus Maw, MD  ciprofloxacin (CILOXAN) 0.3 % ophthalmic solution Place 1 drop into both eyes 2 (two) times daily. *ONLY USES AS NEEDED FOR FLARE* 03/09/13   Historical Provider, MD  nitroGLYCERIN (NITROSTAT) 0.4 MG SL tablet Place 0.4 mg under the tongue every 5 (five) minutes x 3 doses as needed for chest pain.    Historical Provider, MD  promethazine (PHENERGAN) 25 MG tablet Take 1 tablet by mouth daily as needed for nausea.  03/09/13   Historical Provider, MD    FAMILY  HISTORY: Family History  Problem Relation Age of Onset  . Heart disease Father     SOCIAL HISTORY:  reports that he quit smoking about 14 years ago. His smoking use included Cigarettes. He has a 60 pack-year smoking history. He has never used smokeless tobacco. He reports that he does not drink alcohol or use illicit drugs.  REVIEW OF SYSTEMS:  Constitutional:   No  weight loss, night sweats,  Fevers, chills, fatigue.  HEENT:    No headaches, Difficulty swallowing,Tooth/dental problems,Sore throat,  No sneezing, itching, ear ache, nasal congestion, post nasal drip,   Cardio-vascular: No chest pain,  Orthopnea, PND, swelling in lower extremities, anasarca,         dizziness, palpitations  GI:  No heartburn, indigestion, abdominal pain, nausea, vomiting, diarrhea, change in       bowel  habits, loss of appetite  Resp: No shortness of breath with exertion or at rest.  No excess mucus, no productive cough, No non-productive cough,  No coughing up of blood.No change in color of mucus.No wheezing.No chest wall deformity  Skin:  no rash or lesions.  GU:  no dysuria, change in color of urine, no urgency or frequency.  No flank pain.  Musculoskeletal: No joint pain or swelling.  No decreased range of motion.  No back pain.  Psych: No change in mood or affect. No depression or anxiety.  No memory loss.   PHYSICAL EXAM: Blood pressure 183/57, pulse 61, temperature 98.6 F (37 C), temperature source Rectal, resp. rate 15, SpO2 90.00%.  General appearance :Awake,and pleasantly confused, not in any distress. Speech Clear. Not toxic Looking HEENT: Atraumatic and Normocephalic, pupils equally reactive to light and accomodation Neck: supple,+JVD. No cervical lymphadenopathy.  Chest:Good air entry bilaterally, bibasilar rales  CVS: S1 S2 regular, no murmurs.  Abdomen: Bowel sounds present, Non tender and not distended with no gaurding, rigidity or rebound. Extremities: B/L Lower Ext  shows1-+ edema, both legs are warm to touch. Chronic skin changes to b/l lower ext Neurology: Non focal Skin:No Rash Wounds:N/A  LABS ON ADMISSION:   Recent Labs  11/11/13 1344  NA 144  K 4.7  CL 104  CO2 28  GLUCOSE 157*  BUN 66*  CREATININE 2.31*  CALCIUM 9.8    Recent Labs  11/11/13 1344  AST 17  ALT 12  ALKPHOS 18*  BILITOT 0.4  PROT 7.5  ALBUMIN 3.5   No results found for this basename: LIPASE, AMYLASE,  in the last 72 hours  Recent Labs  11/11/13 1344  WBC 5.6  HGB 12.3*  HCT 38.1*  MCV 95.3  PLT 151    Recent Labs  11/11/13 1344  TROPONINI <0.30   No results found for this basename: DDIMER,  in the last 72 hours No components found with this basename: POCBNP,    RADIOLOGIC STUDIES ON ADMISSION: Ct Head Wo Contrast  11/11/2013   CLINICAL DATA:  Altered mental status  EXAM: CT HEAD WITHOUT CONTRAST  TECHNIQUE: Contiguous axial images were obtained from the base of the skull through the vertex without intravenous contrast.  COMPARISON:  None.  FINDINGS: Large old infarct with encephalomalacia with in the left frontal lobe. No acute infarction. No hemorrhage. No mass effect or midline shift. No hydrocephalus.  No acute calvarial abnormality.  Visualized paranasal sinuses and mastoids clear. Orbital soft tissues unremarkable.  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  No acute intracranial abnormality.   Electronically Signed   By: Charlett Nose M.D.   On: 11/11/2013 15:07   Dg Chest Port 1 View  11/11/2013   CLINICAL DATA:  Shortness of breath and chest pain  EXAM: PORTABLE CHEST - 1 VIEW  COMPARISON:  August 22, 2013  FINDINGS: There is interstitial edema throughout the lungs bilaterally. There is cardiomegaly with pulmonary venous hypertension. There is no airspace consolidation. No adenopathy. Patient is status post median sternotomy.  IMPRESSION: Congestive heart failure.  No airspace consolidation.   Electronically Signed   By:  Bretta Bang M.D.   On: 11/11/2013 14:05     EKG: Independently reviewed. White complex rhythm-suspect RBBB pattern  ASSESSMENT AND PLAN: Present on Admission:  . Acute on chronic combined systolic and diastolic CHF, NYHA class 3 - Admit to telemetry, start Lasix, strict I&O is, daily weights. Follow clinical course. Consult cardiology.  - Hold  losartan and Aldactone for now given significant elevation in creatinine.   . Stage III chronic kidney disease  - Creatinine slightly worse than usual baseline. Hold losartan and Aldactone, cautiously continue with Lasix. Follow electrolytes closely. If any worsening, consult nephrology.   . Chronic hypoxemic respiratory failure - Continue oxygen 2 L per nasal cannula.   . Dementia with behavioral disturbance - Patient is pleasantly confused, supportive care.   . Diabetes mellitus, type 2 - Continue Lantus, had SSI. Follow CBGs.   Marland Kitchen. HYPERTENSION - Continue oral antihypertensive medications-holding Aldactone and losartan for now.   Marland Kitchen. HYPOTHYROIDISM - Continue levothyroxine   . History of ventricular tachycardia - Continue with beta blocker on amiodarone, reviewed prior cardiology note-patient has refused further workup for PT in the past.  Further plan will depend as patient's clinical course evolves and further radiologic and laboratory data become available. Patient will be monitored closely.  Above noted plan was discussed with patient/daughter, they were in agreement.   DVT Prophylaxis: Prophylactic  Heparin  Code Status: DNR-confirmed with daughter  Total time spent for admission equals 45 minutes.  [facility]IM[facility],Dahlton Hinde [facility] Pager [phone]  If 7PM-7AM, please contact night-coverage [email or web address] Password [email or web address] 11/11/2013, 4:23 PM  **Disclaimer: This note may have been dictated with voice recognition software. Similar sounding words can inadvertently be transcribed and this note may contain transcription  errors which may not have been corrected upon publication of note.**

## 2013-11-12 DIAGNOSIS — I679 Cerebrovascular disease, unspecified: Secondary | ICD-10-CM

## 2013-11-12 DIAGNOSIS — I452 Bifascicular block: Secondary | ICD-10-CM

## 2013-11-12 DIAGNOSIS — I25812 Atherosclerosis of bypass graft of coronary artery of transplanted heart without angina pectoris: Secondary | ICD-10-CM

## 2013-11-12 DIAGNOSIS — E1129 Type 2 diabetes mellitus with other diabetic kidney complication: Secondary | ICD-10-CM

## 2013-11-12 DIAGNOSIS — I259 Chronic ischemic heart disease, unspecified: Secondary | ICD-10-CM

## 2013-11-12 DIAGNOSIS — N058 Unspecified nephritic syndrome with other morphologic changes: Secondary | ICD-10-CM

## 2013-11-12 DIAGNOSIS — I1310 Hypertensive heart and chronic kidney disease without heart failure, with stage 1 through stage 4 chronic kidney disease, or unspecified chronic kidney disease: Secondary | ICD-10-CM

## 2013-11-12 DIAGNOSIS — I5043 Acute on chronic combined systolic (congestive) and diastolic (congestive) heart failure: Principal | ICD-10-CM

## 2013-11-12 LAB — GLUCOSE, CAPILLARY
GLUCOSE-CAPILLARY: 149 mg/dL — AB (ref 70–99)
Glucose-Capillary: 71 mg/dL (ref 70–99)
Glucose-Capillary: 66 mg/dL — ABNORMAL LOW (ref 70–99)
Glucose-Capillary: 109 mg/dL — ABNORMAL HIGH (ref 70–99)
Glucose-Capillary: 90 mg/dL (ref 70–99)

## 2013-11-12 LAB — BASIC METABOLIC PANEL
ANION GAP: 10 (ref 5–15)
BUN: 59 mg/dL — ABNORMAL HIGH (ref 6–23)
CHLORIDE: 108 meq/L (ref 96–112)
CO2: 27 mEq/L (ref 19–32)
Calcium: 9.6 mg/dL (ref 8.4–10.5)
Creatinine, Ser: 2.02 mg/dL — ABNORMAL HIGH (ref 0.50–1.35)
GFR calc Af Amer: 35 mL/min — ABNORMAL LOW (ref >90)
GFR calc non Af Amer: 30 mL/min — ABNORMAL LOW (ref >90)
Glucose, Bld: 92 mg/dL (ref 70–99)
POTASSIUM: 4.1 meq/L (ref 3.7–5.3)
Sodium: 145 mEq/L (ref 137–147)

## 2013-11-12 LAB — TROPONIN I

## 2013-11-12 MED ORDER — DEXTROSE 50 % IV SOLN
INTRAVENOUS | Status: AC
  Filled 2013-11-12: qty 50

## 2013-11-12 MED ORDER — LORAZEPAM 2 MG/ML IJ SOLN
1.0000 mg | INTRAMUSCULAR | Status: DC | PRN
  Administered 2013-11-12 – 2013-11-13 (×3): 1 mg via INTRAVENOUS
  Filled 2013-11-12 (×3): qty 1

## 2013-11-12 MED ORDER — LORAZEPAM 2 MG/ML IJ SOLN
INTRAMUSCULAR | Status: AC
  Administered 2013-11-12: 1 mg
  Filled 2013-11-12: qty 1

## 2013-11-12 MED ORDER — DEXTROSE 50 % IV SOLN
1.0000 | Freq: Once | INTRAVENOUS | Status: AC
  Administered 2013-11-12: 50 mL via INTRAVENOUS

## 2013-11-12 NOTE — Consult Note (Signed)
The patient was seen and examined, and I agree with the assessment and plan as documented above, with modifications as noted below. 80 yr old male with CAD/CABG, h/o VT, ischemic cardiomyopathy (EF 45-50%) with restrictive physiology in 08/2013, HTN, CKD stage III, dementia, COPD with documented O2 noncompliance, and CVA admitted with acute delirium secondary to acute on chronic systolic heart failure. He has refused invasive evaluations in the past (coronary angiography), and would have a high propensity for contrast-induced nephropathy. He is currently sleeping and history is obtained from Lupita LeashDonna, his daughter and POA, whom he lives with.  RECS: Agree with IV Lasix 40 mg bid for diuresis, with 1.8 liters of output in last 24 hrs. Do not recommend repeating echocardiogram at this time, as ECG is unchanged from prior and troponins are normal. Etiology likely ischemic with cardiorenal component given underlying substrate. However, he is on good medical therapy with Coreg, Lipitor, and nitrates. BP additionally controlled with amlodipine and hydralazine. Agree with holding Aldactone and losartan for now, but would resume when renal function stabilizes. Troponins normal. Amiodarone has been restarted. Currently sinus bradycardia but stable (RBBB, LPFB).

## 2013-11-12 NOTE — Progress Notes (Signed)
Subjective: He was admitted with heart failure. He seems a little better. His renal function was a little bit worse than usual also. He has no new complaints today.  Objective: Vital signs in last 24 hours: Temp:  [97.3 F (36.3 C)-98.6 F (37 C)] 97.7 F (36.5 C) (07/09 0558) Pulse Rate:  [47-65] 50 (07/09 0558) Resp:  [15-20] 20 (07/08 2128) BP: (122-183)/(50-70) 135/50 mmHg (07/09 0558) SpO2:  [90 %-95 %] 95 % (07/09 0558) Weight:  [92.2 kg (203 lb 4.2 oz)-101 kg (222 lb 10.6 oz)] 92.2 kg (203 lb 4.2 oz) (07/09 0500) Weight change:  Last BM Date: 11/10/13  Intake/Output from previous day: 07/08 0701 - 07/09 0700 In: 50 [P.O.:50] Out: 1850 [Urine:1850]  PHYSICAL EXAM General appearance: alert, cooperative, mild distress and morbidly obese Resp: rales bilaterally Cardio: regular rate and rhythm, S1, S2 normal, no murmur, click, rub or gallop GI: soft, non-tender; bowel sounds normal; no masses,  no organomegaly Extremities: He has multiple scars on his legs from previous leg wounds. His right leg is somewhat erythematous. Both legs have 1-2+ edema  Lab Results:  Results for orders placed during the hospital encounter of 11/11/13 (from the past 48 hour(s))  CBC     Status: Abnormal   Collection Time    11/11/13  1:44 PM      Result Value Ref Range   WBC 5.6  4.0 - 10.5 K/uL   RBC 4.00 (*) 4.22 - 5.81 MIL/uL   Hemoglobin 12.3 (*) 13.0 - 17.0 g/dL   HCT 38.1 (*) 39.0 - 52.0 %   MCV 95.3  78.0 - 100.0 fL   MCH 30.8  26.0 - 34.0 pg   MCHC 32.3  30.0 - 36.0 g/dL   RDW 18.9 (*) 11.5 - 15.5 %   Platelets 151  150 - 400 K/uL  COMPREHENSIVE METABOLIC PANEL     Status: Abnormal   Collection Time    11/11/13  1:44 PM      Result Value Ref Range   Sodium 144  137 - 147 mEq/L   Potassium 4.7  3.7 - 5.3 mEq/L   Chloride 104  96 - 112 mEq/L   CO2 28  19 - 32 mEq/L   Glucose, Bld 157 (*) 70 - 99 mg/dL   BUN 66 (*) 6 - 23 mg/dL   Creatinine, Ser 2.31 (*) 0.50 - 1.35 mg/dL   Calcium 9.8  8.4 - 10.5 mg/dL   Total Protein 7.5  6.0 - 8.3 g/dL   Albumin 3.5  3.5 - 5.2 g/dL   AST 17  0 - 37 U/L   ALT 12  0 - 53 U/L   Alkaline Phosphatase 18 (*) 39 - 117 U/L   Total Bilirubin 0.4  0.3 - 1.2 mg/dL   GFR calc non Af Amer 26 (*) >90 mL/min   GFR calc Af Amer 30 (*) >90 mL/min   Comment: (NOTE)     The eGFR has been calculated using the CKD EPI equation.     This calculation has not been validated in all clinical situations.     eGFR's persistently <90 mL/min signify possible Chronic Kidney     Disease.   Anion gap 12  5 - 15  PROTIME-INR     Status: None   Collection Time    11/11/13  1:44 PM      Result Value Ref Range   Prothrombin Time 14.9  11.6 - 15.2 seconds   INR 1.17  0.00 -  1.49  TROPONIN I     Status: None   Collection Time    11/11/13  1:44 PM      Result Value Ref Range   Troponin I <0.30  <0.30 ng/mL   Comment:            Due to the release kinetics of cTnI,     a negative result within the first hours     of the onset of symptoms does not rule out     myocardial infarction with certainty.     If myocardial infarction is still suspected,     repeat the test at appropriate intervals.  PRO B NATRIURETIC PEPTIDE     Status: Abnormal   Collection Time    11/11/13  1:44 PM      Result Value Ref Range   Pro B Natriuretic peptide (BNP) 2652.0 (*) 0 - 450 pg/mL  CBG MONITORING, ED     Status: Abnormal   Collection Time    11/11/13  1:55 PM      Result Value Ref Range   Glucose-Capillary 140 (*) 70 - 99 mg/dL  I-STAT CG4 LACTIC ACID, ED     Status: Abnormal   Collection Time    11/11/13  2:19 PM      Result Value Ref Range   Lactic Acid, Venous 0.48 (*) 0.5 - 2.2 mmol/L  TROPONIN I     Status: None   Collection Time    11/11/13  4:20 PM      Result Value Ref Range   Troponin I <0.30  <0.30 ng/mL   Comment:            Due to the release kinetics of cTnI,     a negative result within the first hours     of the onset of symptoms does not  rule out     myocardial infarction with certainty.     If myocardial infarction is still suspected,     repeat the test at appropriate intervals.  PHENYTOIN LEVEL, TOTAL     Status: Abnormal   Collection Time    11/11/13  4:20 PM      Result Value Ref Range   Phenytoin Lvl 25.9 (*) 10.0 - 20.0 ug/mL  GLUCOSE, CAPILLARY     Status: Abnormal   Collection Time    11/11/13  5:12 PM      Result Value Ref Range   Glucose-Capillary 149 (*) 70 - 99 mg/dL   Comment 1 Notify RN     Comment 2 Documented in Chart    GLUCOSE, CAPILLARY     Status: Abnormal   Collection Time    11/11/13  9:24 PM      Result Value Ref Range   Glucose-Capillary 145 (*) 70 - 99 mg/dL  TROPONIN I     Status: None   Collection Time    11/11/13 10:03 PM      Result Value Ref Range   Troponin I <0.30  <0.30 ng/mL   Comment:            Due to the release kinetics of cTnI,     a negative result within the first hours     of the onset of symptoms does not rule out     myocardial infarction with certainty.     If myocardial infarction is still suspected,     repeat the test at appropriate intervals.  TROPONIN I     Status: None  Collection Time    11/12/13  4:19 AM      Result Value Ref Range   Troponin I <0.30  <0.30 ng/mL   Comment:            Due to the release kinetics of cTnI,     a negative result within the first hours     of the onset of symptoms does not rule out     myocardial infarction with certainty.     If myocardial infarction is still suspected,     repeat the test at appropriate intervals.  BASIC METABOLIC PANEL     Status: Abnormal   Collection Time    11/12/13  4:19 AM      Result Value Ref Range   Sodium 145  137 - 147 mEq/L   Potassium 4.1  3.7 - 5.3 mEq/L   Chloride 108  96 - 112 mEq/L   CO2 27  19 - 32 mEq/L   Glucose, Bld 92  70 - 99 mg/dL   BUN 59 (*) 6 - 23 mg/dL   Creatinine, Ser 2.02 (*) 0.50 - 1.35 mg/dL   Calcium 9.6  8.4 - 10.5 mg/dL   GFR calc non Af Amer 30 (*) >90  mL/min   GFR calc Af Amer 35 (*) >90 mL/min   Comment: (NOTE)     The eGFR has been calculated using the CKD EPI equation.     This calculation has not been validated in all clinical situations.     eGFR's persistently <90 mL/min signify possible Chronic Kidney     Disease.   Anion gap 10  5 - 15  GLUCOSE, CAPILLARY     Status: None   Collection Time    11/12/13  7:39 AM      Result Value Ref Range   Glucose-Capillary 71  70 - 99 mg/dL   Comment 1 Notify RN     Comment 2 Documented in Chart      ABGS No results found for this basename: PHART, PCO2, PO2ART, TCO2, HCO3,  in the last 72 hours CULTURES No results found for this or any previous visit (from the past 240 hour(s)). Studies/Results: Ct Head Wo Contrast  11/11/2013   CLINICAL DATA:  Altered mental status  EXAM: CT HEAD WITHOUT CONTRAST  TECHNIQUE: Contiguous axial images were obtained from the base of the skull through the vertex without intravenous contrast.  COMPARISON:  None.  FINDINGS: Large old infarct with encephalomalacia with in the left frontal lobe. No acute infarction. No hemorrhage. No mass effect or midline shift. No hydrocephalus.  No acute calvarial abnormality.  Visualized paranasal sinuses and mastoids clear. Orbital soft tissues unremarkable.  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  No acute intracranial abnormality.   Electronically Signed   By: Rolm Baptise M.D.   On: 11/11/2013 15:07   Dg Chest Port 1 View  11/11/2013   CLINICAL DATA:  Shortness of breath and chest pain  EXAM: PORTABLE CHEST - 1 VIEW  COMPARISON:  August 22, 2013  FINDINGS: There is interstitial edema throughout the lungs bilaterally. There is cardiomegaly with pulmonary venous hypertension. There is no airspace consolidation. No adenopathy. Patient is status post median sternotomy.  IMPRESSION: Congestive heart failure.  No airspace consolidation.   Electronically Signed   By: Lowella Grip M.D.   On: 11/11/2013 14:05     Medications:  Prior to Admission:  Prescriptions prior to admission  Medication Sig Dispense Refill  . allopurinol (  ZYLOPRIM) 300 MG tablet Take 300 mg by mouth every morning.       Marland Kitchen ALPRAZolam (XANAX) 0.5 MG tablet Take 0.5 mg by mouth 3 (three) times daily as needed. For anxiety      . amLODipine (NORVASC) 5 MG tablet Take 5 mg by mouth every morning.       Marland Kitchen atorvastatin (LIPITOR) 80 MG tablet Take 40 mg by mouth at bedtime.       . carvedilol (COREG) 25 MG tablet Take 25 mg by mouth 2 (two) times daily with a meal.      . dipyridamole-aspirin (AGGRENOX) 25-200 MG per 12 hr capsule Take 1 capsule by mouth 2 (two) times daily.      . fenofibrate 160 MG tablet Take 160 mg by mouth every morning.       . furosemide (LASIX) 40 MG tablet Take 1 tablet (40 mg total) by mouth daily. May take twice daily if needed  30 tablet  12  . glimepiride (AMARYL) 4 MG tablet Take 4 mg by mouth 2 (two) times daily with a meal.       . hydrALAZINE (APRESOLINE) 50 MG tablet Take 50 mg by mouth 4 (four) times daily.      Marland Kitchen HYDROcodone-acetaminophen (NORCO/VICODIN) 5-325 MG per tablet Take 1 tablet by mouth every 6 (six) hours as needed for pain.      Marland Kitchen insulin glargine (LANTUS) 100 UNIT/ML injection Inject 25 Units into the skin at bedtime.      . isosorbide mononitrate (IMDUR) 30 MG 24 hr tablet Take 1 tablet (30 mg total) by mouth daily.  30 tablet  12  . levETIRAcetam (KEPPRA) 500 MG tablet Take 500 mg by mouth 2 (two) times daily.      Marland Kitchen levothyroxine (SYNTHROID, LEVOTHROID) 200 MCG tablet Takes 228mg tablet and 25 mcg tablet for total dose of 2238m      . levothyroxine (SYNTHROID, LEVOTHROID) 25 MCG tablet Takes 20041mtablet and 25 mcg tablet for total dose of 225m40m    . losartan (COZAAR) 25 MG tablet Take 25 mg by mouth daily.      . metolazone (ZAROXOLYN) 5 MG tablet Take 5 mg by mouth 2 (two) times daily as needed. Swelling      . phenytoin (DILANTIN) 100 MG ER capsule Take 100-300 mg by mouth  2 (two) times daily. Patient take 100 in the morning and 300 at night      . potassium chloride (K-DUR) 10 MEQ tablet Take 20 mEq by mouth 3 (three) times daily.       . spMarland Kitchenronolactone (ALDACTONE) 12.5 mg TABS Take 0.5 tablets (12.5 mg total) by mouth daily.  30 tablet  12  . Tamsulosin HCl (FLOMAX) 0.4 MG CAPS Take 0.4 mg by mouth 2 (two) times daily.      . amMarland Kitchenodarone (PACERONE) 200 MG tablet Take 1 tablet (200 mg total) by mouth daily.  60 tablet  6  . ciprofloxacin (CILOXAN) 0.3 % ophthalmic solution Place 1 drop into both eyes 2 (two) times daily. *ONLY USES AS NEEDED FOR FLARE*      . nitroGLYCERIN (NITROSTAT) 0.4 MG SL tablet Place 0.4 mg under the tongue every 5 (five) minutes x 3 doses as needed for chest pain.      . promethazine (PHENERGAN) 25 MG tablet Take 1 tablet by mouth daily as needed for nausea.        Scheduled: . allopurinol  300 mg Oral q morning - 10a  .  amiodarone  200 mg Oral Daily  . amLODipine  5 mg Oral q morning - 10a  . atorvastatin  40 mg Oral QHS  . carvedilol  25 mg Oral BID WC  . dipyridamole-aspirin  1 capsule Oral BID  . fenofibrate  160 mg Oral q morning - 10a  . furosemide  40 mg Intravenous Q12H  . glimepiride  4 mg Oral BID WC  . heparin  5,000 Units Subcutaneous 3 times per day  . hydrALAZINE  50 mg Oral QID  . insulin aspart  0-9 Units Subcutaneous TID WC  . insulin glargine  25 Units Subcutaneous QHS  . isosorbide mononitrate  30 mg Oral Daily  . levETIRAcetam  500 mg Oral BID  . levothyroxine  225 mcg Oral QAC breakfast  . phenytoin  100 mg Oral Daily  . phenytoin  300 mg Oral QHS  . potassium chloride  20 mEq Oral TID  . sodium chloride  3 mL Intravenous Q12H  . tamsulosin  0.4 mg Oral BID   Continuous:  PTW:SFKCLE chloride, acetaminophen, albuterol, ALPRAZolam, nitroGLYCERIN, ondansetron (ZOFRAN) IV, sodium chloride  Assesment: He was admitted with acute on chronic combined systolic and diastolic heart failure. He is he is known to have  coronary artery occlusive disease. He has chronic hypoxia but does not uses oxygen all the time. He's had a previous stroke and that has left him with some problems with dementia. Principal Problem:   Acute on chronic combined systolic and diastolic CHF, NYHA class 3 Active Problems:   HYPOTHYROIDISM   HYPERTENSION   Wide-complex tachycardia   Arteriosclerotic cardiovascular disease (ASCVD)   Diabetes mellitus, type 2   Dementia with behavioral disturbance   Chronic hypoxemic respiratory failure    Plan: Continue current treatments.    LOS: 1 day   Esco Joslyn L 11/12/2013, 8:40 AM

## 2013-11-12 NOTE — Progress Notes (Signed)
Refused PM meds. Cussing and combative. Spit meds out. Held insulin due to BS 90. Poor PO.

## 2013-11-12 NOTE — Consult Note (Signed)
CARDIOLOGY CONSULT NOTE   Patient ID: Donald Owen MRN: [id number] DOB/AGE: 1933/06/17 80 y.o.  Admit Date: 11/11/2013 Referring Physician: Kari Baars MD Primary Physician: Fredirick Maudlin, MD Consulting Cardiologist: Prentice Docker MD Primary Cardiologist: Lewayne Bunting MD Reason for Consultation: CHF  Clinical Summary Donald Owen is a 80 y.o.male with known history of  CAD s/p CABG 2000, ICM with systolic dysfunction EF of 45% combined  CHF,  DM II, hypertension, CKD Stage III, COPD on 2 1/2 liters, CVA with residual hemiparesis admitted with acute on chronic CHF with NYHA Class III symptoms. He was recently admitted in April of 2015 in the setting of  NSTEMI treated medically as he refused repeat cardiac cath, and has hx of contrast induced nephropathy.    He was seen at [facility] in 05/2011 for NSTEMI, but refused catheterization or nuclear study at that time. He also had VT requiring cardioversion that admission. He was seen by Dr. Ladona Ridgel and was recommended for further invasive work up but refused and was treated medically with BB and Amiodarone  History is obtained from his daughter who is POA for Health Care as he is now sedated. She states he was in his usual state of health and had sudden AMS change, didn't know her or where he was. He was combative last evening as well.  This scared her, thinking that he may have had another CVA so EMS was called.  On arrival to ER, BP 171/57. HR 55, O2 Sat 91%, Troponin negative, phenytoin level elevated at 25.9, lactic acid 0.48,  Pro-BNP 2652. Creatinine 2.31, BUN 66.  GFR 26. CXR CHF with no airspace consolidation. CT head, old left MCA infarct with bifrontal encephalomalacia. EKG RBBB and LPFB ST depression  inferior leads rate of 65 bpm. Started on IV lasix, held ARB and Aldactone due to elevated Creatinine. He has diuresed 1800 cc, Wt is not accurate for diureses recording.   Daughter states that she is his main caregiver, has  hospital bed in her home along with assist devices to aid in his care. He has not been seen by Dr. Ladona Ridgel since Jan hospitalization in January. He was told he would be seen annually. He does not have local cardiology care or management in [place]..   No Known Allergies  Medications Scheduled Medications: . allopurinol  300 mg Oral q morning - 10a  . amiodarone  200 mg Oral Daily  . amLODipine  5 mg Oral q morning - 10a  . atorvastatin  40 mg Oral QHS  . carvedilol  25 mg Oral BID WC  . dipyridamole-aspirin  1 capsule Oral BID  . fenofibrate  160 mg Oral q morning - 10a  . furosemide  40 mg Intravenous Q12H  . glimepiride  4 mg Oral BID WC  . heparin  5,000 Units Subcutaneous 3 times per day  . hydrALAZINE  50 mg Oral QID  . insulin aspart  0-9 Units Subcutaneous TID WC  . insulin glargine  25 Units Subcutaneous QHS  . isosorbide mononitrate  30 mg Oral Daily  . levETIRAcetam  500 mg Oral BID  . levothyroxine  225 mcg Oral QAC breakfast  . phenytoin  100 mg Oral Daily  . phenytoin  300 mg Oral QHS  . potassium chloride  20 mEq Oral TID  . sodium chloride  3 mL Intravenous Q12H  . tamsulosin  0.4 mg Oral BID    Infusions:    PRN Medications: sodium chloride, acetaminophen, albuterol, ALPRAZolam,  LORazepam, nitroGLYCERIN, ondansetron (ZOFRAN) IV, sodium chloride   Past Medical History  Diagnosis Date  . Coronary atherosclerosis of native coronary artery 2000    CABG 2000 following acute MI - refused followup cardiac catheterization 2012  . Gout   . Hypothyroidism   . Seizures   . Ventricular tachycardia     On amiodarone  . Diabetes mellitus, type 2   . Cerebrovascular disease 2004    Left cerebral CVA followed by carotid endarterectomy in [place]  . Essential hypertension, benign   . Gastritis 2004    Gastric erosions  . Hyperlipidemia   . Ischemic cardiomyopathy     LVEF 45% 2013    Past Surgical History  Procedure Laterality Date  . Coronary artery bypass  graft  04/1999    Dr. Barry Dieneswens  . Carotid endarterectomy    . Cholecystectomy      Family History  Problem Relation Age of Onset  . Heart disease Father     Social History Donald Owen reports that he quit smoking about 14 years ago. His smoking use included Cigarettes. He has a 60 pack-year smoking history. He has never used smokeless tobacco. Donald Owen reports that he does not drink alcohol.  Review of Systems Otherwise reviewed and negative except as outlined.  Physical Examination Blood pressure 135/50, pulse 50, temperature 97.7 F (36.5 C), temperature source Oral, resp. rate 20, height 5\' 8"  (1.727 m), weight 203 lb 4.2 oz (92.2 kg), SpO2 95.00%.  Intake/Output Summary (Last 24 hours) at 11/12/13 1223 Last data filed at 11/12/13 0500  Gross per 24 hour  Intake     50 ml  Output   1850 ml  Net  -1800 ml    Telemetry:   GEN: Sedated.  HEENT: Conjunctiva and lids normal, oropharynx clear with moist mucosa. Neck: Supple, no elevated JVP or carotid bruits, no thyromegaly. Lungs: Clear to auscultation, nonlabored breathing at rest. Cardiac: Regular rate and rhythm,  Bradycardic no S3 or significant systolic murmur, no pericardial rub. Abdomen: Soft, nontender, no hepatomegaly, bowel sounds present, no guarding or rebound. Extremities: No pitting edema, distal pulses 2+. Skin: Warm and dry. Musculoskeletal: No kyphosis. Neuropsychiatric: Sedated.unable to assess.  Prior Cardiac Testing/Procedures 1. Echocardiogram 4. 2015 Left ventricle: Wall thickness was increased in a pattern of moderate LVH. Systolic function was mildly reduced. The estimated ejection fraction was in the range of 45% to 50%. There is akinesis and scarring of the basal-midinferolateral and inferior myocardium. Doppler parameters are consistent with restrictive physiology, indicative of decreased left ventricular diastolic compliance and/or increased left atrial pressure. - Aortic valve: Moderately  calcified annulus. Trileaflet; mildly calcified leaflets. No significant regurgitation. - Mitral valve: Mildly thickened leaflets . Mild regurgitation. - Left atrium: The atrium was severely dilated. - Right ventricle: Systolic function was mildly reduced. - Right atrium: The atrium was moderately dilated. Central venous pressure: 15mm Hg (est). - Atrial septum: No defect or patent foramen ovale was identified. - Tricuspid valve: Trivial regurgitation. - Pulmonary arteries: Systolic pressure was moderately increased. PA peak pressure: 51mm Hg (S). - Pericardium, extracardiac: There was no pericardial effusion. There was a left pleural effusion.   Lab Results  Basic Metabolic Panel:  Recent Labs Lab 11/11/13 1344 11/12/13 0419  NA 144 145  K 4.7 4.1  CL 104 108  CO2 28 27  GLUCOSE 157* 92  BUN 66* 59*  CREATININE 2.31* 2.02*  CALCIUM 9.8 9.6    Liver Function Tests:  Recent Labs Lab 11/11/13 1344  AST 17  ALT 12  ALKPHOS 18*  BILITOT 0.4  PROT 7.5  ALBUMIN 3.5    CBC:  Recent Labs Lab 11/11/13 1344  WBC 5.6  HGB 12.3*  HCT 38.1*  MCV 95.3  PLT 151    Cardiac Enzymes:  Recent Labs Lab 11/11/13 1344 11/11/13 1620 11/11/13 2203 11/12/13 0419  TROPONINI <0.30 <0.30 <0.30 <0.30    Radiology: Ct Head Wo Contrast  11/11/2013   CLINICAL DATA:  Altered mental status  EXAM: CT HEAD WITHOUT CONTRAST  TECHNIQUE: Contiguous axial images were obtained from the base of the skull through the vertex without intravenous contrast.  COMPARISON:  None.  FINDINGS: Large old infarct with encephalomalacia with in the left frontal lobe. No acute infarction. No hemorrhage. No mass effect or midline shift. No hydrocephalus.  No acute calvarial abnormality.  Visualized paranasal sinuses and mastoids clear. Orbital soft tissues unremarkable.  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  No acute intracranial abnormality.   Electronically Signed   By:  Charlett Nose M.D.   On: 11/11/2013 15:07   Dg Chest Port 1 View  11/11/2013   CLINICAL DATA:  Shortness of breath and chest pain  EXAM: PORTABLE CHEST - 1 VIEW  COMPARISON:  August 22, 2013  FINDINGS: There is interstitial edema throughout the lungs bilaterally. There is cardiomegaly with pulmonary venous hypertension. There is no airspace consolidation. No adenopathy. Patient is status post median sternotomy.  IMPRESSION: Congestive heart failure.  No airspace consolidation.   Electronically Signed   By: Bretta Bang M.D.   On: 11/11/2013 14:05     ECG: RBBB, rate of 65 bpm.   Impression and Recommendations  1.A/C mixed CHF: Daughter states that he is compliant with medications because she provides them to him every day. She denies feeding him salty foods, but on rare occasions he has a sausage and gravy biscuit, but not for several months. She has been giving him his mediations with butter mild to assist with swallowing. He is diuresing well with IV lasix. Can consider repeating limited study echo to assist with management. ARB and Aldactone discontinued due to worsening real fx. On lasix 40 mg daily at home.   2. CAD with ICM: Consider cardiorenal syndrome with decreased EF and renal deterioration. Agree with stopping aldactone for now. Will need to be back on ARB as soon as renal function improves.   He wishes medical management only for CAD. Troponin is negative arguing against ACS as etiology of his current status. Cannot r/o recurrent ventricular arrhythmia. Do not find him to be on amiodarone any longer at home but was discharged on this in April . Remains on carvedilol 25 mg BID, statin.   3.AMS Acute; CT ruled out acute infarct. No significant metabolic abnormalities with the exception of elevated creatinine on admission. Combative overnight, now sedated. Neuro consult  recommended.    Signed: Bettey Mare. Conn Trombetta NP  11/12/2013, 12:23 PM Co-Sign MD

## 2013-11-12 NOTE — Progress Notes (Signed)
Patient refused pills this AM. Patient became agitated, pulled IV out and telemetry. Patient attempting to get out of bed. Staff try to redirect patient without success. Patient swinging and kicking staff. Safety mitts applied. Dr. Juanetta GoslingHawkins notified. New orders for Ativan 1mg  IV q4hrs PRN. Also discussed with Dr. Juanetta GoslingHawkins about Foley catheter due to patient being on aggressive IV diuretics. Condom cath was attempted, patient kept taking it off. New order for Foley catheter.

## 2013-11-12 NOTE — Care Management Utilization Note (Signed)
UR completed 

## 2013-11-12 NOTE — Progress Notes (Signed)
Hypoglycemic Event  CBG: 66  Treatment: D50 IV 25 mL  Symptoms: None  Follow-up CBG: Time: 1215 CBG Result: 149  Possible Reasons for Event: Inadequate meal intake  Comments/MD notified: Dr. Juanetta GoslingHawkins notified    Donald Owen  Remember to initiate Hypoglycemia Order Set & complete

## 2013-11-13 DIAGNOSIS — N179 Acute kidney failure, unspecified: Secondary | ICD-10-CM

## 2013-11-13 DIAGNOSIS — N189 Chronic kidney disease, unspecified: Secondary | ICD-10-CM

## 2013-11-13 DIAGNOSIS — I7090 Unspecified atherosclerosis: Secondary | ICD-10-CM

## 2013-11-13 DIAGNOSIS — I2510 Atherosclerotic heart disease of native coronary artery without angina pectoris: Secondary | ICD-10-CM

## 2013-11-13 LAB — BASIC METABOLIC PANEL
Anion gap: 9 (ref 5–15)
BUN: 55 mg/dL — ABNORMAL HIGH (ref 6–23)
CALCIUM: 10 mg/dL (ref 8.4–10.5)
CO2: 31 mEq/L (ref 19–32)
CREATININE: 2.09 mg/dL — AB (ref 0.50–1.35)
Chloride: 108 mEq/L (ref 96–112)
GFR calc Af Amer: 33 mL/min — ABNORMAL LOW (ref >90)
GFR, EST NON AFRICAN AMERICAN: 29 mL/min — AB (ref >90)
GLUCOSE: 63 mg/dL — AB (ref 70–99)
Potassium: 4.4 mEq/L (ref 3.7–5.3)
SODIUM: 148 meq/L — AB (ref 137–147)

## 2013-11-13 LAB — GLUCOSE, CAPILLARY
GLUCOSE-CAPILLARY: 106 mg/dL — AB (ref 70–99)
Glucose-Capillary: 58 mg/dL — ABNORMAL LOW (ref 70–99)
Glucose-Capillary: 90 mg/dL (ref 70–99)
Glucose-Capillary: 142 mg/dL — ABNORMAL HIGH (ref 70–99)
Glucose-Capillary: 147 mg/dL — ABNORMAL HIGH (ref 70–99)

## 2013-11-13 MED ORDER — GLUCOSE-VITAMIN C 4-6 GM-MG PO CHEW: 4.0000 | CHEWABLE_TABLET | ORAL | Status: DC | PRN

## 2013-11-13 MED ORDER — GLUCOSE 40 % PO GEL: 1.0000 | ORAL | Status: DC | PRN

## 2013-11-13 MED ORDER — DEXTROSE 50 % IV SOLN
25.0000 mL | Freq: Once | INTRAVENOUS | Status: AC | PRN
  Administered 2013-11-13: 25 mL via INTRAVENOUS

## 2013-11-13 MED ORDER — DEXTROSE 50 % IV SOLN
INTRAVENOUS | Status: AC
  Filled 2013-11-13: qty 50

## 2013-11-13 MED ORDER — INSULIN ASPART 100 UNIT/ML ~~LOC~~ SOLN
0.0000 [IU] | Freq: Three times a day (TID) | SUBCUTANEOUS | Status: DC
  Administered 2013-11-13: 1 [IU] via SUBCUTANEOUS
  Administered 2013-11-14: 3 [IU] via SUBCUTANEOUS
  Administered 2013-11-14 – 2013-11-15 (×4): 1 [IU] via SUBCUTANEOUS
  Administered 2013-11-15: 2 [IU] via SUBCUTANEOUS
  Administered 2013-11-16: 3 [IU] via SUBCUTANEOUS
  Administered 2013-11-16: 2 [IU] via SUBCUTANEOUS
  Administered 2013-11-17: 1 [IU] via SUBCUTANEOUS
  Administered 2013-11-17 – 2013-11-18 (×3): 2 [IU] via SUBCUTANEOUS
  Administered 2013-11-18: 1 [IU] via SUBCUTANEOUS

## 2013-11-13 MED ORDER — PHENYTOIN SODIUM 50 MG/ML IJ SOLN
100.0000 mg | Freq: Once | INTRAMUSCULAR | Status: AC
  Administered 2013-11-13: 100 mg via INTRAVENOUS
  Filled 2013-11-13: qty 2

## 2013-11-13 MED ORDER — LORAZEPAM 2 MG/ML IJ SOLN
1.0000 mg | INTRAMUSCULAR | Status: DC | PRN
  Administered 2013-11-13 – 2013-11-17 (×3): 2 mg via INTRAVENOUS
  Filled 2013-11-13 (×3): qty 1

## 2013-11-13 MED ORDER — PHENYTOIN SODIUM 50 MG/ML IJ SOLN
200.0000 mg | Freq: Two times a day (BID) | INTRAMUSCULAR | Status: DC
  Administered 2013-11-13: 200 mg via INTRAVENOUS
  Filled 2013-11-13 (×6): qty 4

## 2013-11-13 MED ORDER — HALOPERIDOL LACTATE 5 MG/ML IJ SOLN
5.0000 mg | Freq: Four times a day (QID) | INTRAMUSCULAR | Status: DC | PRN
  Administered 2013-11-13: 5 mg via INTRAVENOUS
  Filled 2013-11-13: qty 1

## 2013-11-13 NOTE — Progress Notes (Signed)
Pt refusing po medication.  Pt will not open mouth and shouts obscenities at nursing staff when they try to get his to take anything by mouth.  Pt spitting out biotene and crushed meds.  Dr Juanetta GoslingHawkins aware and ordered a change in medication.  Pt had several runs of Vtach on telemetry.  Stat EKG obtained and cardiology has been paged.

## 2013-11-13 NOTE — Progress Notes (Signed)
Consulting cardiologist: Kristen LoaderKonswaran Primary Cardiologist: Purvis SheffieldKoneswaran  Subjective:    Sedated, non-responsive  Objective:   Temp:  [97.4 F (36.3 C)-98.1 F (36.7 C)] 97.4 F (36.3 C) (07/10 16100633) Pulse Rate:  [57-83] 83 (07/10 0633) Resp:  [20] 20 (07/10 0633) BP: (105-145)/(42-70) 105/69 mmHg (07/10 0633) SpO2:  [96 %] 96 % (07/09 2114) Weight:  [201 lb 11.5 oz (91.5 kg)] 201 lb 11.5 oz (91.5 kg) (07/10 0432) Last BM Date: 11/10/13  Filed Weights   11/11/13 1656 11/12/13 0500 11/13/13 0432  Weight: 222 lb 10.6 oz (101 kg) 203 lb 4.2 oz (92.2 kg) 201 lb 11.5 oz (91.5 kg)    Intake/Output Summary (Last 24 hours) at 11/13/13 0845 Last data filed at 11/13/13 0648  Gross per 24 hour  Intake      0 ml  Output   3000 ml  Net  -3000 ml    Telemetry:  Exam:  General: Sleeping   HEENT: Conjunctiva and lids normal, oropharynx clear.  Lungs: Diminished breath sounds. Poor respiratory effort.   Cardiac: Elevated JVP or bruits. RRR, no gallop or rub.   Abdomen: Normoactive bowel sounds, nontender, nondistended.  Extremities: 1+2+ pitting edema, distal pulses full.  Neuropsychiatric: Sedated   Lab Results:  Basic Metabolic Panel:  Recent Labs Lab 11/11/13 1344 11/12/13 0419 11/13/13 0609  NA 144 145 148*  K 4.7 4.1 4.4  CL 104 108 108  CO2 28 27 31   GLUCOSE 157* 92 63*  BUN 66* 59* 55*  CREATININE 2.31* 2.02* 2.09*  CALCIUM 9.8 9.6 10.0    Liver Function Tests:  Recent Labs Lab 11/11/13 1344  AST 17  ALT 12  ALKPHOS 18*  BILITOT 0.4  PROT 7.5  ALBUMIN 3.5    CBC:  Recent Labs Lab 11/11/13 1344  WBC 5.6  HGB 12.3*  HCT 38.1*  MCV 95.3  PLT 151    Cardiac Enzymes:  Recent Labs Lab 11/11/13 1620 11/11/13 2203 11/12/13 0419  TROPONINI <0.30 <0.30 <0.30    BNP:  Recent Labs  08/19/13 1632 11/11/13 1344  PROBNP 2134.0* 2652.0*    Coagulation:  Recent Labs Lab 11/11/13 1344  INR 1.17    Radiology: Ct Head Wo  Contrast  11/11/2013   CLINICAL DATA:  Altered mental status  EXAM: CT HEAD WITHOUT CONTRAST  TECHNIQUE: Contiguous axial images were obtained from the base of the skull through the vertex without intravenous contrast.  COMPARISON:  None.  FINDINGS: Large old infarct with encephalomalacia with in the left frontal lobe. No acute infarction. No hemorrhage. No mass effect or midline shift. No hydrocephalus.  No acute calvarial abnormality.  Visualized paranasal sinuses and mastoids clear. Orbital soft tissues unremarkable.  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  No acute intracranial abnormality.   Electronically Signed   By: Charlett NoseKevin  Dover M.D.   On: 11/11/2013 15:07   Dg Chest Port 1 View  11/11/2013   CLINICAL DATA:  Shortness of breath and chest pain  EXAM: PORTABLE CHEST - 1 VIEW  COMPARISON:  August 22, 2013  FINDINGS: There is interstitial edema throughout the lungs bilaterally. There is cardiomegaly with pulmonary venous hypertension. There is no airspace consolidation. No adenopathy. Patient is status post median sternotomy.  IMPRESSION: Congestive heart failure.  No airspace consolidation.   Electronically Signed   By: Bretta BangWilliam  Woodruff M.D.   On: 11/11/2013 14:05     Medications:   Scheduled Medications: . allopurinol  300 mg Oral q morning - 10a  .  amiodarone  200 mg Oral Daily  . amLODipine  5 mg Oral q morning - 10a  . atorvastatin  40 mg Oral QHS  . carvedilol  25 mg Oral BID WC  . dipyridamole-aspirin  1 capsule Oral BID  . fenofibrate  160 mg Oral q morning - 10a  . furosemide  40 mg Intravenous Q12H  . glimepiride  4 mg Oral BID WC  . heparin  5,000 Units Subcutaneous 3 times per day  . hydrALAZINE  50 mg Oral QID  . insulin aspart  0-9 Units Subcutaneous TID WC  . insulin glargine  25 Units Subcutaneous QHS  . isosorbide mononitrate  30 mg Oral Daily  . levETIRAcetam  500 mg Oral BID  . levothyroxine  225 mcg Oral QAC breakfast  . phenytoin  100 mg  Oral Daily  . phenytoin  300 mg Oral QHS  . potassium chloride  20 mEq Oral TID  . sodium chloride  3 mL Intravenous Q12H  . tamsulosin  0.4 mg Oral BID    PRN Medications: sodium chloride, acetaminophen, albuterol, ALPRAZolam, dextrose, glucose-Vitamin C, LORazepam, nitroGLYCERIN, ondansetron (ZOFRAN) IV, sodium chloride   Assessment and Plan:   1.A/C Mixed CHF: He is continuing to diurese with greater than 4000 cc urine output since admission. Still evidence of fluid overload with LEE and diminished breath sounds. Wt down from 222 to 201 since admission. Creatinine 2.09 with CO 2 31.Would conintue Lasix 40 mg IV BID.  Uncertain of dry wt. Echo in April 45%-50% EF. Continue to hold ARB and Aldactone for now.  2. CAD with ICM: Medical management only for CAD. Remains on coreg, statin. ARB on hold. Maintaining NSR with RBBB bradycardic.Amiodarone is restarted.   3. AMS Acute: Defer to PCP with possible neuro consult at their discretion.   Bettey Mare. Alexsandra Shontz NP  11/13/2013, 8:45 AM

## 2013-11-13 NOTE — Progress Notes (Signed)
Hypoglycemic Event  CBG: 58  Treatment: D50 IV 25 mL  Symptoms: Pale  Follow-up CBG: Time:  0810 CBG Result:  106  Possible Reasons for Event: Inadequate meal intake  Comments/MD notified: DR Juanetta GoslingHawkins in the room and orders were placed    Reva BoresHolt, Rasaan Brotherton L  Remember to initiate Hypoglycemia Order Set & complete

## 2013-11-13 NOTE — Progress Notes (Signed)
Subjective: He is overall about the same. He still confused. He is hypoglycemic this morning and Lantus will be discontinued for now. He I want to stop his Amaryl he seems better as far as his breathing is concerned  Objective: Vital signs in last 24 hours: Temp:  [97.4 F (36.3 C)-98.1 F (36.7 C)] 97.4 F (36.3 C) (07/10 1610) Pulse Rate:  [57-83] 83 (07/10 0633) Resp:  [20] 20 (07/10 0633) BP: (105-145)/(42-70) 105/69 mmHg (07/10 0633) SpO2:  [96 %] 96 % (07/09 2114) Weight:  [91.5 kg (201 lb 11.5 oz)] 91.5 kg (201 lb 11.5 oz) (07/10 0432) Weight change: -9.5 kg (-20 lb 15.1 oz) Last BM Date: 11/10/13  Intake/Output from previous day: 07/09 0701 - 07/10 0700 In: 100 [P.O.:100] Out: 3000 [Urine:3000]  PHYSICAL EXAM General appearance: moderate distress Resp: clear to auscultation bilaterally Cardio: regular rate and rhythm, S1, S2 normal, no murmur, click, rub or gallop GI: soft, non-tender; bowel sounds normal; no masses,  no organomegaly Extremities: extremities normal, atraumatic, no cyanosis or edema  Lab Results:  Results for orders placed during the hospital encounter of 11/11/13 (from the past 48 hour(s))  CBC     Status: Abnormal   Collection Time    11/11/13  1:44 PM      Result Value Ref Range   WBC 5.6  4.0 - 10.5 K/uL   RBC 4.00 (*) 4.22 - 5.81 MIL/uL   Hemoglobin 12.3 (*) 13.0 - 17.0 g/dL   HCT 38.1 (*) 39.0 - 52.0 %   MCV 95.3  78.0 - 100.0 fL   MCH 30.8  26.0 - 34.0 pg   MCHC 32.3  30.0 - 36.0 g/dL   RDW 18.9 (*) 11.5 - 15.5 %   Platelets 151  150 - 400 K/uL  COMPREHENSIVE METABOLIC PANEL     Status: Abnormal   Collection Time    11/11/13  1:44 PM      Result Value Ref Range   Sodium 144  137 - 147 mEq/L   Potassium 4.7  3.7 - 5.3 mEq/L   Chloride 104  96 - 112 mEq/L   CO2 28  19 - 32 mEq/L   Glucose, Bld 157 (*) 70 - 99 mg/dL   BUN 66 (*) 6 - 23 mg/dL   Creatinine, Ser 2.31 (*) 0.50 - 1.35 mg/dL   Calcium 9.8  8.4 - 10.5 mg/dL   Total Protein  7.5  6.0 - 8.3 g/dL   Albumin 3.5  3.5 - 5.2 g/dL   AST 17  0 - 37 U/L   ALT 12  0 - 53 U/L   Alkaline Phosphatase 18 (*) 39 - 117 U/L   Total Bilirubin 0.4  0.3 - 1.2 mg/dL   GFR calc non Af Amer 26 (*) >90 mL/min   GFR calc Af Amer 30 (*) >90 mL/min   Comment: (NOTE)     The eGFR has been calculated using the CKD EPI equation.     This calculation has not been validated in all clinical situations.     eGFR's persistently <90 mL/min signify possible Chronic Kidney     Disease.   Anion gap 12  5 - 15  PROTIME-INR     Status: None   Collection Time    11/11/13  1:44 PM      Result Value Ref Range   Prothrombin Time 14.9  11.6 - 15.2 seconds   INR 1.17  0.00 - 1.49  TROPONIN I  Status: None   Collection Time    11/11/13  1:44 PM      Result Value Ref Range   Troponin I <0.30  <0.30 ng/mL   Comment:            Due to the release kinetics of cTnI,     a negative result within the first hours     of the onset of symptoms does not rule out     myocardial infarction with certainty.     If myocardial infarction is still suspected,     repeat the test at appropriate intervals.  PRO B NATRIURETIC PEPTIDE     Status: Abnormal   Collection Time    11/11/13  1:44 PM      Result Value Ref Range   Pro B Natriuretic peptide (BNP) 2652.0 (*) 0 - 450 pg/mL  CBG MONITORING, ED     Status: Abnormal   Collection Time    11/11/13  1:55 PM      Result Value Ref Range   Glucose-Capillary 140 (*) 70 - 99 mg/dL  I-STAT CG4 LACTIC ACID, ED     Status: Abnormal   Collection Time    11/11/13  2:19 PM      Result Value Ref Range   Lactic Acid, Venous 0.48 (*) 0.5 - 2.2 mmol/L  TROPONIN I     Status: None   Collection Time    11/11/13  4:20 PM      Result Value Ref Range   Troponin I <0.30  <0.30 ng/mL   Comment:            Due to the release kinetics of cTnI,     a negative result within the first hours     of the onset of symptoms does not rule out     myocardial infarction with  certainty.     If myocardial infarction is still suspected,     repeat the test at appropriate intervals.  PHENYTOIN LEVEL, TOTAL     Status: Abnormal   Collection Time    11/11/13  4:20 PM      Result Value Ref Range   Phenytoin Lvl 25.9 (*) 10.0 - 20.0 ug/mL  GLUCOSE, CAPILLARY     Status: Abnormal   Collection Time    11/11/13  5:12 PM      Result Value Ref Range   Glucose-Capillary 149 (*) 70 - 99 mg/dL   Comment 1 Notify RN     Comment 2 Documented in Chart    GLUCOSE, CAPILLARY     Status: Abnormal   Collection Time    11/11/13  9:24 PM      Result Value Ref Range   Glucose-Capillary 145 (*) 70 - 99 mg/dL  TROPONIN I     Status: None   Collection Time    11/11/13 10:03 PM      Result Value Ref Range   Troponin I <0.30  <0.30 ng/mL   Comment:            Due to the release kinetics of cTnI,     a negative result within the first hours     of the onset of symptoms does not rule out     myocardial infarction with certainty.     If myocardial infarction is still suspected,     repeat the test at appropriate intervals.  TROPONIN I     Status: None   Collection Time    11/12/13  4:19 AM      Result Value Ref Range   Troponin I <0.30  <0.30 ng/mL   Comment:            Due to the release kinetics of cTnI,     a negative result within the first hours     of the onset of symptoms does not rule out     myocardial infarction with certainty.     If myocardial infarction is still suspected,     repeat the test at appropriate intervals.  BASIC METABOLIC PANEL     Status: Abnormal   Collection Time    11/12/13  4:19 AM      Result Value Ref Range   Sodium 145  137 - 147 mEq/L   Potassium 4.1  3.7 - 5.3 mEq/L   Chloride 108  96 - 112 mEq/L   CO2 27  19 - 32 mEq/L   Glucose, Bld 92  70 - 99 mg/dL   BUN 59 (*) 6 - 23 mg/dL   Creatinine, Ser 2.02 (*) 0.50 - 1.35 mg/dL   Calcium 9.6  8.4 - 10.5 mg/dL   GFR calc non Af Amer 30 (*) >90 mL/min   GFR calc Af Amer 35 (*) >90  mL/min   Comment: (NOTE)     The eGFR has been calculated using the CKD EPI equation.     This calculation has not been validated in all clinical situations.     eGFR's persistently <90 mL/min signify possible Chronic Kidney     Disease.   Anion gap 10  5 - 15  GLUCOSE, CAPILLARY     Status: None   Collection Time    11/12/13  7:39 AM      Result Value Ref Range   Glucose-Capillary 71  70 - 99 mg/dL   Comment 1 Notify RN     Comment 2 Documented in Chart    GLUCOSE, CAPILLARY     Status: Abnormal   Collection Time    11/12/13 11:32 AM      Result Value Ref Range   Glucose-Capillary 66 (*) 70 - 99 mg/dL   Comment 1 Notify RN     Comment 2 Documented in Chart    GLUCOSE, CAPILLARY     Status: Abnormal   Collection Time    11/12/13 12:15 PM      Result Value Ref Range   Glucose-Capillary 149 (*) 70 - 99 mg/dL   Comment 1 Notify RN     Comment 2 Documented in Chart    GLUCOSE, CAPILLARY     Status: Abnormal   Collection Time    11/12/13  4:58 PM      Result Value Ref Range   Glucose-Capillary 109 (*) 70 - 99 mg/dL   Comment 1 Notify RN     Comment 2 Documented in Chart    GLUCOSE, CAPILLARY     Status: None   Collection Time    11/12/13  9:13 PM      Result Value Ref Range   Glucose-Capillary 90  70 - 99 mg/dL   Comment 1 Notify RN     Comment 2 Documented in Chart    BASIC METABOLIC PANEL     Status: Abnormal   Collection Time    11/13/13  6:09 AM      Result Value Ref Range   Sodium 148 (*) 137 - 147 mEq/L   Potassium 4.4  3.7 - 5.3 mEq/L   Chloride  108  96 - 112 mEq/L   CO2 31  19 - 32 mEq/L   Glucose, Bld 63 (*) 70 - 99 mg/dL   BUN 55 (*) 6 - 23 mg/dL   Creatinine, Ser 2.09 (*) 0.50 - 1.35 mg/dL   Calcium 10.0  8.4 - 10.5 mg/dL   GFR calc non Af Amer 29 (*) >90 mL/min   GFR calc Af Amer 33 (*) >90 mL/min   Comment: (NOTE)     The eGFR has been calculated using the CKD EPI equation.     This calculation has not been validated in all clinical situations.      eGFR's persistently <90 mL/min signify possible Chronic Kidney     Disease.   Anion gap 9  5 - 15  GLUCOSE, CAPILLARY     Status: Abnormal   Collection Time    11/13/13  7:22 AM      Result Value Ref Range   Glucose-Capillary 58 (*) 70 - 99 mg/dL   Comment 1 Notify RN    GLUCOSE, CAPILLARY     Status: Abnormal   Collection Time    11/13/13  8:22 AM      Result Value Ref Range   Glucose-Capillary 106 (*) 70 - 99 mg/dL   Comment 1 Notify RN      ABGS No results found for this basename: PHART, PCO2, PO2ART, TCO2, HCO3,  in the last 72 hours CULTURES No results found for this or any previous visit (from the past 240 hour(s)). Studies/Results: Ct Head Wo Contrast  11/11/2013   CLINICAL DATA:  Altered mental status  EXAM: CT HEAD WITHOUT CONTRAST  TECHNIQUE: Contiguous axial images were obtained from the base of the skull through the vertex without intravenous contrast.  COMPARISON:  None.  FINDINGS: Large old infarct with encephalomalacia with in the left frontal lobe. No acute infarction. No hemorrhage. No mass effect or midline shift. No hydrocephalus.  No acute calvarial abnormality.  Visualized paranasal sinuses and mastoids clear. Orbital soft tissues unremarkable.  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  No acute intracranial abnormality.   Electronically Signed   By: Rolm Baptise M.D.   On: 11/11/2013 15:07   Dg Chest Port 1 View  11/11/2013   CLINICAL DATA:  Shortness of breath and chest pain  EXAM: PORTABLE CHEST - 1 VIEW  COMPARISON:  August 22, 2013  FINDINGS: There is interstitial edema throughout the lungs bilaterally. There is cardiomegaly with pulmonary venous hypertension. There is no airspace consolidation. No adenopathy. Patient is status post median sternotomy.  IMPRESSION: Congestive heart failure.  No airspace consolidation.   Electronically Signed   By: Lowella Grip M.D.   On: 11/11/2013 14:05    Medications:  Prior to Admission:   Prescriptions prior to admission  Medication Sig Dispense Refill  . allopurinol (ZYLOPRIM) 300 MG tablet Take 300 mg by mouth every morning.       Marland Kitchen ALPRAZolam (XANAX) 0.5 MG tablet Take 0.5 mg by mouth 3 (three) times daily as needed. For anxiety      . amLODipine (NORVASC) 5 MG tablet Take 5 mg by mouth every morning.       Marland Kitchen atorvastatin (LIPITOR) 80 MG tablet Take 40 mg by mouth at bedtime.       . carvedilol (COREG) 25 MG tablet Take 25 mg by mouth 2 (two) times daily with a meal.      . dipyridamole-aspirin (AGGRENOX) 25-200 MG per 12 hr capsule Take 1  capsule by mouth 2 (two) times daily.      . fenofibrate 160 MG tablet Take 160 mg by mouth every morning.       . furosemide (LASIX) 40 MG tablet Take 1 tablet (40 mg total) by mouth daily. May take twice daily if needed  30 tablet  12  . glimepiride (AMARYL) 4 MG tablet Take 4 mg by mouth 2 (two) times daily with a meal.       . hydrALAZINE (APRESOLINE) 50 MG tablet Take 50 mg by mouth 4 (four) times daily.      Marland Kitchen HYDROcodone-acetaminophen (NORCO/VICODIN) 5-325 MG per tablet Take 1 tablet by mouth every 6 (six) hours as needed for pain.      Marland Kitchen insulin glargine (LANTUS) 100 UNIT/ML injection Inject 25 Units into the skin at bedtime.      . isosorbide mononitrate (IMDUR) 30 MG 24 hr tablet Take 1 tablet (30 mg total) by mouth daily.  30 tablet  12  . levETIRAcetam (KEPPRA) 500 MG tablet Take 500 mg by mouth 2 (two) times daily.      Marland Kitchen levothyroxine (SYNTHROID, LEVOTHROID) 200 MCG tablet Takes 221mg tablet and 25 mcg tablet for total dose of 2266m      . levothyroxine (SYNTHROID, LEVOTHROID) 25 MCG tablet Takes 20015mtablet and 25 mcg tablet for total dose of 225m29m    . losartan (COZAAR) 25 MG tablet Take 25 mg by mouth daily.      . metolazone (ZAROXOLYN) 5 MG tablet Take 5 mg by mouth 2 (two) times daily as needed. Swelling      . phenytoin (DILANTIN) 100 MG ER capsule Take 100-300 mg by mouth 2 (two) times daily. Patient take 100 in  the morning and 300 at night      . potassium chloride (K-DUR) 10 MEQ tablet Take 20 mEq by mouth 3 (three) times daily.       . spMarland Kitchenronolactone (ALDACTONE) 12.5 mg TABS Take 0.5 tablets (12.5 mg total) by mouth daily.  30 tablet  12  . Tamsulosin HCl (FLOMAX) 0.4 MG CAPS Take 0.4 mg by mouth 2 (two) times daily.      . amMarland Kitchenodarone (PACERONE) 200 MG tablet Take 1 tablet (200 mg total) by mouth daily.  60 tablet  6  . ciprofloxacin (CILOXAN) 0.3 % ophthalmic solution Place 1 drop into both eyes 2 (two) times daily. *ONLY USES AS NEEDED FOR FLARE*      . nitroGLYCERIN (NITROSTAT) 0.4 MG SL tablet Place 0.4 mg under the tongue every 5 (five) minutes x 3 doses as needed for chest pain.      . promethazine (PHENERGAN) 25 MG tablet Take 1 tablet by mouth daily as needed for nausea.        Scheduled: . allopurinol  300 mg Oral q morning - 10a  . amiodarone  200 mg Oral Daily  . amLODipine  5 mg Oral q morning - 10a  . atorvastatin  40 mg Oral QHS  . carvedilol  25 mg Oral BID WC  . dipyridamole-aspirin  1 capsule Oral BID  . fenofibrate  160 mg Oral q morning - 10a  . furosemide  40 mg Intravenous Q12H  . glimepiride  4 mg Oral BID WC  . heparin  5,000 Units Subcutaneous 3 times per day  . hydrALAZINE  50 mg Oral QID  . insulin aspart  0-9 Units Subcutaneous TID WC  . insulin glargine  25 Units Subcutaneous QHS  . isosorbide  mononitrate  30 mg Oral Daily  . levETIRAcetam  500 mg Oral BID  . levothyroxine  225 mcg Oral QAC breakfast  . phenytoin  100 mg Oral Daily  . phenytoin  300 mg Oral QHS  . potassium chloride  20 mEq Oral TID  . sodium chloride  3 mL Intravenous Q12H  . tamsulosin  0.4 mg Oral BID   Continuous:  AST:MHDQQI chloride, acetaminophen, albuterol, ALPRAZolam, dextrose, glucose-Vitamin C, LORazepam, nitroGLYCERIN, ondansetron (ZOFRAN) IV, sodium chloride  Assesment: He was admitted with acute on chronic combined systolic and diastolic heart failure. He is improving some. He  said problems with his blood sugar and its low this morning. His medications will be modified. He has some dementia with behavioral disturbance and I think that's worse because of his hypoglycemia. He has chronic renal failure which is stable Principal Problem:   Acute on chronic combined systolic and diastolic CHF, NYHA class 3 Active Problems:   HYPOTHYROIDISM   HYPERTENSION   Wide-complex tachycardia   Arteriosclerotic cardiovascular disease (ASCVD)   Diabetes mellitus, type 2   Dementia with behavioral disturbance   Chronic hypoxemic respiratory failure    Plan: Discontinue Lantus and Amaryl for now    LOS: 2 days   Taheera Thomann L 11/13/2013, 8:50 AM

## 2013-11-13 NOTE — Progress Notes (Signed)
The patient was seen and examined, and I agree with the assessment and plan as documented above, with modifications as noted below.  80 yr old male with CAD/CABG, h/o VT, ischemic cardiomyopathy (EF 45-50%) with restrictive physiology in 08/2013, HTN, CKD stage III, dementia, COPD with documented O2 noncompliance, and CVA admitted with acute delirium secondary to acute on chronic systolic heart failure.  He has refused invasive evaluations in the past (coronary angiography), and would have a high propensity for contrast-induced nephropathy.  He is currently sleeping. I again spoke with Lupita LeashDonna, his daughter and POA, whom he lives with.  She is concerned about his inability to take oral anti-seizure meds. Had hypoglycemic episodes, likely contributing to neurological status.  RECS:  Agree with continuing IV Lasix 40 mg bid for diuresis, with 4.7 liters of output in last 48 hrs. Do not recommend repeating echocardiogram at this time, as ECG is unchanged from prior and troponins are normal. Etiology likely ischemic with cardiorenal component given underlying substrate. However, he is on good medical therapy with Coreg, Lipitor, and nitrates. BP additionally controlled with amlodipine and hydralazine.  Agree with continuing to hold Aldactone and losartan for now, but would resume when renal function stabilizes.  Troponins normal. Continue amiodarone. Currently sinus bradycardia but stable (RBBB, LPFB).

## 2013-11-13 NOTE — Progress Notes (Signed)
Inpatient Diabetes Program Recommendations  AACE/ADA: New Consensus Statement on Inpatient Glycemic Control (2013)  Target Ranges:  Prepandial:   less than 140 mg/dL      Peak postprandial:   less than 180 mg/dL (1-2 hours)      Critically ill patients:  140 - 180 mg/dL   Results for [name], Endi A (MRN [id number]) as of 11/13/2013 08:29  Ref. Range 11/12/2013 07:39 11/12/2013 11:32 11/12/2013 12:15 11/12/2013 16:58 11/12/2013 21:13 11/13/2013 07:22 11/13/2013 08:22  Glucose-Capillary Latest Range: 70-99 mg/dL 71 66 (L) 782149 (H) 956109 (H) 90 58 (L) 106 (H)   Diabetes history: DM2 Outpatient Diabetes medications: Lantus 25 units QHS, Amaryl 4 mg BID Current orders for Inpatient glycemic control: Lantus 25 units QHS, Amaryl 4 mg BID, Novolog 0-9 units AC  Inpatient Diabetes Program Recommendations Insulin - Basal: Noted Lantus was not given last night (charted as Not Given with reason of BG 90 mg/dl).  Please consider decreasing Lantus by at least half of current dose or discontinue altogether for now. Oral Agents: Please discontinue Amaryl while inpatient.  Thanks, Orlando PennerMarie Tommi Crepeau, RN, MSN, CCRN Diabetes Coordinator Inpatient Diabetes Program [phone] (Team Pager) [phone] ([facility] office) [phone] [facility] office)

## 2013-11-14 LAB — BASIC METABOLIC PANEL
ANION GAP: 12 (ref 5–15)
BUN: 43 mg/dL — AB (ref 6–23)
CO2: 31 meq/L (ref 19–32)
CREATININE: 1.76 mg/dL — AB (ref 0.50–1.35)
Calcium: 10.1 mg/dL (ref 8.4–10.5)
Chloride: 102 mEq/L (ref 96–112)
GFR calc non Af Amer: 35 mL/min — ABNORMAL LOW (ref >90)
GFR, EST AFRICAN AMERICAN: 41 mL/min — AB (ref >90)
Glucose, Bld: 154 mg/dL — ABNORMAL HIGH (ref 70–99)
Potassium: 3.9 mEq/L (ref 3.7–5.3)
Sodium: 145 mEq/L (ref 137–147)

## 2013-11-14 LAB — GLUCOSE, CAPILLARY
GLUCOSE-CAPILLARY: 144 mg/dL — AB (ref 70–99)
Glucose-Capillary: 202 mg/dL — ABNORMAL HIGH (ref 70–99)
Glucose-Capillary: 179 mg/dL — ABNORMAL HIGH (ref 70–99)
Glucose-Capillary: 131 mg/dL — ABNORMAL HIGH (ref 70–99)

## 2013-11-14 MED ORDER — PHENYTOIN SODIUM EXTENDED 100 MG PO CAPS
100.0000 mg | ORAL_CAPSULE | Freq: Every day | ORAL | Status: DC
  Administered 2013-11-14 – 2013-11-18 (×5): 100 mg via ORAL
  Filled 2013-11-14 (×5): qty 1

## 2013-11-14 MED ORDER — PHENYTOIN SODIUM EXTENDED 100 MG PO CAPS
300.0000 mg | ORAL_CAPSULE | Freq: Every day | ORAL | Status: DC
  Administered 2013-11-14 – 2013-11-17 (×4): 300 mg via ORAL
  Filled 2013-11-14 (×4): qty 3

## 2013-11-14 NOTE — Progress Notes (Signed)
Subjective: He's much more alert. He has less confusion. He is eating and has taken his medications by mouth.  Objective: Vital signs in last 24 hours: Temp:  [98.2 F (36.8 C)-99 F (37.2 C)] 98.2 F (36.8 C) (07/11 0710) Pulse Rate:  [73-99] 99 (07/11 0710) Resp:  [20] 20 (07/11 0710) BP: (111-170)/(50-93) 111/93 mmHg (07/11 0710) SpO2:  [97 %-98 %] 97 % (07/10 2151) Weight:  [85.6 kg (188 lb 11.4 oz)] 85.6 kg (188 lb 11.4 oz) (07/11 0441) Weight change: -5.9 kg (-13 lb 0.1 oz) Last BM Date: 11/10/13  Intake/Output from previous day: 07/10 0701 - 07/11 0700 In: 0  Out: 3550 [Urine:3550]  PHYSICAL EXAM General appearance: alert, cooperative, mild distress and moderate distress Resp: rhonchi bilaterally Cardio: regular rate and rhythm, S1, S2 normal, no murmur, click, rub or gallop GI: soft, non-tender; bowel sounds normal; no masses,  no organomegaly Extremities: He has trace edema. He has several previous lacerations on his shins  Lab Results:  Results for orders placed during the hospital encounter of 11/11/13 (from the past 48 hour(s))  GLUCOSE, CAPILLARY     Status: Abnormal   Collection Time    11/12/13 11:32 AM      Result Value Ref Range   Glucose-Capillary 66 (*) 70 - 99 mg/dL   Comment 1 Notify RN     Comment 2 Documented in Chart    GLUCOSE, CAPILLARY     Status: Abnormal   Collection Time    11/12/13 12:15 PM      Result Value Ref Range   Glucose-Capillary 149 (*) 70 - 99 mg/dL   Comment 1 Notify RN     Comment 2 Documented in Chart    GLUCOSE, CAPILLARY     Status: Abnormal   Collection Time    11/12/13  4:58 PM      Result Value Ref Range   Glucose-Capillary 109 (*) 70 - 99 mg/dL   Comment 1 Notify RN     Comment 2 Documented in Chart    GLUCOSE, CAPILLARY     Status: None   Collection Time    11/12/13  9:13 PM      Result Value Ref Range   Glucose-Capillary 90  70 - 99 mg/dL   Comment 1 Notify RN     Comment 2 Documented in Chart    BASIC  METABOLIC PANEL     Status: Abnormal   Collection Time    11/13/13  6:09 AM      Result Value Ref Range   Sodium 148 (*) 137 - 147 mEq/L   Potassium 4.4  3.7 - 5.3 mEq/L   Chloride 108  96 - 112 mEq/L   CO2 31  19 - 32 mEq/L   Glucose, Bld 63 (*) 70 - 99 mg/dL   BUN 55 (*) 6 - 23 mg/dL   Creatinine, Ser 2.09 (*) 0.50 - 1.35 mg/dL   Calcium 10.0  8.4 - 10.5 mg/dL   GFR calc non Af Amer 29 (*) >90 mL/min   GFR calc Af Amer 33 (*) >90 mL/min   Comment: (NOTE)     The eGFR has been calculated using the CKD EPI equation.     This calculation has not been validated in all clinical situations.     eGFR's persistently <90 mL/min signify possible Chronic Kidney     Disease.   Anion gap 9  5 - 15  GLUCOSE, CAPILLARY     Status: Abnormal   Collection  Time    11/13/13  7:22 AM      Result Value Ref Range   Glucose-Capillary 58 (*) 70 - 99 mg/dL   Comment 1 Notify RN    GLUCOSE, CAPILLARY     Status: Abnormal   Collection Time    11/13/13  8:22 AM      Result Value Ref Range   Glucose-Capillary 106 (*) 70 - 99 mg/dL   Comment 1 Notify RN    GLUCOSE, CAPILLARY     Status: None   Collection Time    11/13/13 11:42 AM      Result Value Ref Range   Glucose-Capillary 90  70 - 99 mg/dL   Comment 1 Notify RN    GLUCOSE, CAPILLARY     Status: Abnormal   Collection Time    11/13/13  4:23 PM      Result Value Ref Range   Glucose-Capillary 142 (*) 70 - 99 mg/dL  GLUCOSE, CAPILLARY     Status: Abnormal   Collection Time    11/13/13  9:32 PM      Result Value Ref Range   Glucose-Capillary 147 (*) 70 - 99 mg/dL  BASIC METABOLIC PANEL     Status: Abnormal   Collection Time    11/14/13  5:47 AM      Result Value Ref Range   Sodium 145  137 - 147 mEq/L   Potassium 3.9  3.7 - 5.3 mEq/L   Chloride 102  96 - 112 mEq/L   CO2 31  19 - 32 mEq/L   Glucose, Bld 154 (*) 70 - 99 mg/dL   BUN 43 (*) 6 - 23 mg/dL   Creatinine, Ser 1.76 (*) 0.50 - 1.35 mg/dL   Calcium 10.1  8.4 - 10.5 mg/dL   GFR calc  non Af Amer 35 (*) >90 mL/min   GFR calc Af Amer 41 (*) >90 mL/min   Comment: (NOTE)     The eGFR has been calculated using the CKD EPI equation.     This calculation has not been validated in all clinical situations.     eGFR's persistently <90 mL/min signify possible Chronic Kidney     Disease.   Anion gap 12  5 - 15  GLUCOSE, CAPILLARY     Status: Abnormal   Collection Time    11/14/13  7:39 AM      Result Value Ref Range   Glucose-Capillary 144 (*) 70 - 99 mg/dL   Comment 1 Documented in Chart     Comment 2 Notify RN      ABGS No results found for this basename: PHART, PCO2, PO2ART, TCO2, HCO3,  in the last 72 hours CULTURES No results found for this or any previous visit (from the past 240 hour(s)). Studies/Results: No results found.  Medications:  Prior to Admission:  Prescriptions prior to admission  Medication Sig Dispense Refill  . allopurinol (ZYLOPRIM) 300 MG tablet Take 300 mg by mouth every morning.       Marland Kitchen ALPRAZolam (XANAX) 0.5 MG tablet Take 0.5 mg by mouth 3 (three) times daily as needed. For anxiety      . amLODipine (NORVASC) 5 MG tablet Take 5 mg by mouth every morning.       Marland Kitchen atorvastatin (LIPITOR) 80 MG tablet Take 40 mg by mouth at bedtime.       . carvedilol (COREG) 25 MG tablet Take 25 mg by mouth 2 (two) times daily with a meal.      .  dipyridamole-aspirin (AGGRENOX) 25-200 MG per 12 hr capsule Take 1 capsule by mouth 2 (two) times daily.      . fenofibrate 160 MG tablet Take 160 mg by mouth every morning.       . furosemide (LASIX) 40 MG tablet Take 1 tablet (40 mg total) by mouth daily. May take twice daily if needed  30 tablet  12  . glimepiride (AMARYL) 4 MG tablet Take 4 mg by mouth 2 (two) times daily with a meal.       . hydrALAZINE (APRESOLINE) 50 MG tablet Take 50 mg by mouth 4 (four) times daily.      Marland Kitchen HYDROcodone-acetaminophen (NORCO/VICODIN) 5-325 MG per tablet Take 1 tablet by mouth every 6 (six) hours as needed for pain.      Marland Kitchen insulin  glargine (LANTUS) 100 UNIT/ML injection Inject 25 Units into the skin at bedtime.      . isosorbide mononitrate (IMDUR) 30 MG 24 hr tablet Take 1 tablet (30 mg total) by mouth daily.  30 tablet  12  . levETIRAcetam (KEPPRA) 500 MG tablet Take 500 mg by mouth 2 (two) times daily.      Marland Kitchen levothyroxine (SYNTHROID, LEVOTHROID) 200 MCG tablet Takes 251mg tablet and 25 mcg tablet for total dose of 228m      . levothyroxine (SYNTHROID, LEVOTHROID) 25 MCG tablet Takes 20051mtablet and 25 mcg tablet for total dose of 225m38m    . losartan (COZAAR) 25 MG tablet Take 25 mg by mouth daily.      . metolazone (ZAROXOLYN) 5 MG tablet Take 5 mg by mouth 2 (two) times daily as needed. Swelling      . phenytoin (DILANTIN) 100 MG ER capsule Take 100-300 mg by mouth 2 (two) times daily. Patient take 100 in the morning and 300 at night      . potassium chloride (K-DUR) 10 MEQ tablet Take 20 mEq by mouth 3 (three) times daily.       . spMarland Kitchenronolactone (ALDACTONE) 12.5 mg TABS Take 0.5 tablets (12.5 mg total) by mouth daily.  30 tablet  12  . Tamsulosin HCl (FLOMAX) 0.4 MG CAPS Take 0.4 mg by mouth 2 (two) times daily.      . amMarland Kitchenodarone (PACERONE) 200 MG tablet Take 1 tablet (200 mg total) by mouth daily.  60 tablet  6  . ciprofloxacin (CILOXAN) 0.3 % ophthalmic solution Place 1 drop into both eyes 2 (two) times daily. *ONLY USES AS NEEDED FOR FLARE*      . nitroGLYCERIN (NITROSTAT) 0.4 MG SL tablet Place 0.4 mg under the tongue every 5 (five) minutes x 3 doses as needed for chest pain.      . promethazine (PHENERGAN) 25 MG tablet Take 1 tablet by mouth daily as needed for nausea.        Scheduled: . allopurinol  300 mg Oral q morning - 10a  . amiodarone  200 mg Oral Daily  . amLODipine  5 mg Oral q morning - 10a  . atorvastatin  40 mg Oral QHS  . carvedilol  25 mg Oral BID WC  . dipyridamole-aspirin  1 capsule Oral BID  . fenofibrate  160 mg Oral q morning - 10a  . furosemide  40 mg Intravenous Q12H  . heparin   5,000 Units Subcutaneous 3 times per day  . hydrALAZINE  50 mg Oral QID  . insulin aspart  0-9 Units Subcutaneous TID WC  . isosorbide mononitrate  30 mg Oral Daily  .  levETIRAcetam  500 mg Oral BID  . levothyroxine  225 mcg Oral QAC breakfast  . phenytoin (DILANTIN) IV  200 mg Intravenous Q12H  . potassium chloride  20 mEq Oral TID  . sodium chloride  3 mL Intravenous Q12H  . tamsulosin  0.4 mg Oral BID   Continuous:  HVG:WGYFEE chloride, acetaminophen, albuterol, ALPRAZolam, dextrose, glucose-Vitamin C, haloperidol lactate, LORazepam, nitroGLYCERIN, ondansetron (ZOFRAN) IV, sodium chloride  Assesment: He was admitted with acute on chronic combined systolic and diastolic heart failure. He is better. He has chronic respiratory failure with hypoxia which is also better. He became quite confused agitated and combative and that has improved. He had acute on chronic renal failure which is better Principal Problem:   Acute on chronic combined systolic and diastolic CHF, NYHA class 3 Active Problems:   HYPOTHYROIDISM   HYPERTENSION   Wide-complex tachycardia   Arteriosclerotic cardiovascular disease (ASCVD)   Diabetes mellitus, type 2   Dementia with behavioral disturbance   Chronic hypoxemic respiratory failure    Plan: Restart oral medications. Continue with current treatments.    LOS: 3 days   Rodman Recupero L 11/14/2013, 9:52 AM

## 2013-11-15 DIAGNOSIS — G9341 Metabolic encephalopathy: Secondary | ICD-10-CM | POA: Diagnosis not present

## 2013-11-15 LAB — GLUCOSE, CAPILLARY
GLUCOSE-CAPILLARY: 184 mg/dL — AB (ref 70–99)
GLUCOSE-CAPILLARY: 144 mg/dL — AB (ref 70–99)
Glucose-Capillary: 144 mg/dL — ABNORMAL HIGH (ref 70–99)
Glucose-Capillary: 132 mg/dL — ABNORMAL HIGH (ref 70–99)

## 2013-11-15 NOTE — Progress Notes (Signed)
Pt had a better night last night per RN.  Pt appetite is returning and pt is becoming more alert and less combative.

## 2013-11-15 NOTE — Progress Notes (Signed)
Subjective: He is more alert. He's been taking his medications better and eating better. He has no other new complaints.  Objective: Vital signs in last 24 hours: Temp:  [98 F (36.7 C)-98.4 F (36.9 C)] 98.4 F (36.9 C) (07/12 0628) Pulse Rate:  [55-63] 56 (07/12 0628) Resp:  [12-20] 12 (07/12 0628) BP: (97-123)/(34-40) 123/40 mmHg (07/12 0628) SpO2:  [95 %-98 %] 98 % (07/12 0628) Weight:  [87.1 kg (192 lb 0.3 oz)] 87.1 kg (192 lb 0.3 oz) (07/12 0500) Weight change: 1.5 kg (3 lb 4.9 oz) Last BM Date: 11/15/13  Intake/Output from previous day: 07/11 0701 - 07/12 0700 In: 240 [P.O.:240] Out: 1350 [Urine:1350]  PHYSICAL EXAM General appearance: alert, cooperative and mild distress Resp: rhonchi bilaterally Cardio: regular rate and rhythm, S1, S2 normal, no murmur, click, rub or gallop GI: soft, non-tender; bowel sounds normal; no masses,  no organomegaly Extremities: extremities normal, atraumatic, no cyanosis or edema  Lab Results:  Results for orders placed during the hospital encounter of 11/11/13 (from the past 48 hour(s))  GLUCOSE, CAPILLARY     Status: None   Collection Time    11/13/13 11:42 AM      Result Value Ref Range   Glucose-Capillary 90  70 - 99 mg/dL   Comment 1 Notify RN    GLUCOSE, CAPILLARY     Status: Abnormal   Collection Time    11/13/13  4:23 PM      Result Value Ref Range   Glucose-Capillary 142 (*) 70 - 99 mg/dL  GLUCOSE, CAPILLARY     Status: Abnormal   Collection Time    11/13/13  9:32 PM      Result Value Ref Range   Glucose-Capillary 147 (*) 70 - 99 mg/dL  BASIC METABOLIC PANEL     Status: Abnormal   Collection Time    11/14/13  5:47 AM      Result Value Ref Range   Sodium 145  137 - 147 mEq/L   Potassium 3.9  3.7 - 5.3 mEq/L   Chloride 102  96 - 112 mEq/L   CO2 31  19 - 32 mEq/L   Glucose, Bld 154 (*) 70 - 99 mg/dL   BUN 43 (*) 6 - 23 mg/dL   Creatinine, Ser 1.76 (*) 0.50 - 1.35 mg/dL   Calcium 10.1  8.4 - 10.5 mg/dL   GFR calc  non Af Amer 35 (*) >90 mL/min   GFR calc Af Amer 41 (*) >90 mL/min   Comment: (NOTE)     The eGFR has been calculated using the CKD EPI equation.     This calculation has not been validated in all clinical situations.     eGFR's persistently <90 mL/min signify possible Chronic Kidney     Disease.   Anion gap 12  5 - 15  GLUCOSE, CAPILLARY     Status: Abnormal   Collection Time    11/14/13  7:39 AM      Result Value Ref Range   Glucose-Capillary 144 (*) 70 - 99 mg/dL   Comment 1 Documented in Chart     Comment 2 Notify RN    GLUCOSE, CAPILLARY     Status: Abnormal   Collection Time    11/14/13 11:13 AM      Result Value Ref Range   Glucose-Capillary 202 (*) 70 - 99 mg/dL   Comment 1 Documented in Chart     Comment 2 Notify RN    GLUCOSE, CAPILLARY  Status: Abnormal   Collection Time    11/14/13  4:17 PM      Result Value Ref Range   Glucose-Capillary 179 (*) 70 - 99 mg/dL  GLUCOSE, CAPILLARY     Status: Abnormal   Collection Time    11/14/13  9:57 PM      Result Value Ref Range   Glucose-Capillary 131 (*) 70 - 99 mg/dL  GLUCOSE, CAPILLARY     Status: Abnormal   Collection Time    11/15/13  7:51 AM      Result Value Ref Range   Glucose-Capillary 144 (*) 70 - 99 mg/dL   Comment 1 Documented in Chart     Comment 2 Notify RN      ABGS No results found for this basename: PHART, PCO2, PO2ART, TCO2, HCO3,  in the last 72 hours CULTURES No results found for this or any previous visit (from the past 240 hour(s)). Studies/Results: No results found.  Medications:  Prior to Admission:  Prescriptions prior to admission  Medication Sig Dispense Refill  . allopurinol (ZYLOPRIM) 300 MG tablet Take 300 mg by mouth every morning.       Marland Kitchen ALPRAZolam (XANAX) 0.5 MG tablet Take 0.5 mg by mouth 3 (three) times daily as needed. For anxiety      . amLODipine (NORVASC) 5 MG tablet Take 5 mg by mouth every morning.       Marland Kitchen atorvastatin (LIPITOR) 80 MG tablet Take 40 mg by mouth at  bedtime.       . carvedilol (COREG) 25 MG tablet Take 25 mg by mouth 2 (two) times daily with a meal.      . dipyridamole-aspirin (AGGRENOX) 25-200 MG per 12 hr capsule Take 1 capsule by mouth 2 (two) times daily.      . fenofibrate 160 MG tablet Take 160 mg by mouth every morning.       . furosemide (LASIX) 40 MG tablet Take 1 tablet (40 mg total) by mouth daily. May take twice daily if needed  30 tablet  12  . glimepiride (AMARYL) 4 MG tablet Take 4 mg by mouth 2 (two) times daily with a meal.       . hydrALAZINE (APRESOLINE) 50 MG tablet Take 50 mg by mouth 4 (four) times daily.      Marland Kitchen HYDROcodone-acetaminophen (NORCO/VICODIN) 5-325 MG per tablet Take 1 tablet by mouth every 6 (six) hours as needed for pain.      Marland Kitchen insulin glargine (LANTUS) 100 UNIT/ML injection Inject 25 Units into the skin at bedtime.      . isosorbide mononitrate (IMDUR) 30 MG 24 hr tablet Take 1 tablet (30 mg total) by mouth daily.  30 tablet  12  . levETIRAcetam (KEPPRA) 500 MG tablet Take 500 mg by mouth 2 (two) times daily.      Marland Kitchen levothyroxine (SYNTHROID, LEVOTHROID) 200 MCG tablet Takes 277mg tablet and 25 mcg tablet for total dose of 2233m      . levothyroxine (SYNTHROID, LEVOTHROID) 25 MCG tablet Takes 20034mtablet and 25 mcg tablet for total dose of 225m71m    . losartan (COZAAR) 25 MG tablet Take 25 mg by mouth daily.      . metolazone (ZAROXOLYN) 5 MG tablet Take 5 mg by mouth 2 (two) times daily as needed. Swelling      . phenytoin (DILANTIN) 100 MG ER capsule Take 100-300 mg by mouth 2 (two) times daily. Patient take 100 in the morning and 300 at  night      . potassium chloride (K-DUR) 10 MEQ tablet Take 20 mEq by mouth 3 (three) times daily.       Marland Kitchen spironolactone (ALDACTONE) 12.5 mg TABS Take 0.5 tablets (12.5 mg total) by mouth daily.  30 tablet  12  . Tamsulosin HCl (FLOMAX) 0.4 MG CAPS Take 0.4 mg by mouth 2 (two) times daily.      Marland Kitchen amiodarone (PACERONE) 200 MG tablet Take 1 tablet (200 mg total) by  mouth daily.  60 tablet  6  . ciprofloxacin (CILOXAN) 0.3 % ophthalmic solution Place 1 drop into both eyes 2 (two) times daily. *ONLY USES AS NEEDED FOR FLARE*      . nitroGLYCERIN (NITROSTAT) 0.4 MG SL tablet Place 0.4 mg under the tongue every 5 (five) minutes x 3 doses as needed for chest pain.      . promethazine (PHENERGAN) 25 MG tablet Take 1 tablet by mouth daily as needed for nausea.        Scheduled: . allopurinol  300 mg Oral q morning - 10a  . amiodarone  200 mg Oral Daily  . amLODipine  5 mg Oral q morning - 10a  . atorvastatin  40 mg Oral QHS  . carvedilol  25 mg Oral BID WC  . dipyridamole-aspirin  1 capsule Oral BID  . fenofibrate  160 mg Oral q morning - 10a  . furosemide  40 mg Intravenous Q12H  . heparin  5,000 Units Subcutaneous 3 times per day  . hydrALAZINE  50 mg Oral QID  . insulin aspart  0-9 Units Subcutaneous TID WC  . isosorbide mononitrate  30 mg Oral Daily  . levETIRAcetam  500 mg Oral BID  . levothyroxine  225 mcg Oral QAC breakfast  . phenytoin  100 mg Oral Daily  . phenytoin  300 mg Oral QHS  . potassium chloride  20 mEq Oral TID  . sodium chloride  3 mL Intravenous Q12H  . tamsulosin  0.4 mg Oral BID   Continuous:  DJM:EQASTM chloride, acetaminophen, albuterol, ALPRAZolam, dextrose, glucose-Vitamin C, haloperidol lactate, LORazepam, nitroGLYCERIN, ondansetron (ZOFRAN) IV, sodium chloride  Assesment: He was admitted with acute on chronic combined systolic and diastolic heart failure and is doing better. He had severe problems with encephalopathy I think related to dementia and to his acute illness. He is improving. I don't think he's ready for discharge. I would like to get physical therapy to evaluate him and see if he can get up and move around. I discussed rehabilitation placement with his daughter and she says she does not want to consider that Principal Problem:   Acute on chronic combined systolic and diastolic CHF, NYHA class 3 Active  Problems:   HYPOTHYROIDISM   HYPERTENSION   Wide-complex tachycardia   Arteriosclerotic cardiovascular disease (ASCVD)   Diabetes mellitus, type 2   Dementia with behavioral disturbance   Chronic hypoxemic respiratory failure    Plan: Continue current treatments. PT consult to try to get him ambulating.    LOS: 4 days   , L 11/15/2013, 10:19 AM

## 2013-11-16 DIAGNOSIS — I5043 Acute on chronic combined systolic (congestive) and diastolic (congestive) heart failure: Secondary | ICD-10-CM

## 2013-11-16 LAB — CBC WITH DIFFERENTIAL/PLATELET
BASOS ABS: 0 10*3/uL (ref 0.0–0.1)
BASOS PCT: 0 % (ref 0–1)
EOS ABS: 0.1 10*3/uL (ref 0.0–0.7)
Eosinophils Relative: 2 % (ref 0–5)
HCT: 36.3 % — ABNORMAL LOW (ref 39.0–52.0)
HEMOGLOBIN: 11.8 g/dL — AB (ref 13.0–17.0)
Lymphocytes Relative: 13 % (ref 12–46)
Lymphs Abs: 0.8 10*3/uL (ref 0.7–4.0)
MCH: 30.7 pg (ref 26.0–34.0)
MCHC: 32.5 g/dL (ref 30.0–36.0)
MCV: 94.5 fL (ref 78.0–100.0)
MONOS PCT: 13 % — AB (ref 3–12)
Monocytes Absolute: 0.8 10*3/uL (ref 0.1–1.0)
NEUTROS ABS: 4.4 10*3/uL (ref 1.7–7.7)
NEUTROS PCT: 72 % (ref 43–77)
PLATELETS: 178 10*3/uL (ref 150–400)
RBC: 3.84 MIL/uL — ABNORMAL LOW (ref 4.22–5.81)
RDW: 17.8 % — AB (ref 11.5–15.5)
WBC: 6.2 10*3/uL (ref 4.0–10.5)

## 2013-11-16 LAB — COMPREHENSIVE METABOLIC PANEL
ALT: 10 U/L (ref 0–53)
AST: 23 U/L (ref 0–37)
Albumin: 2.8 g/dL — ABNORMAL LOW (ref 3.5–5.2)
Alkaline Phosphatase: 26 U/L — ABNORMAL LOW (ref 39–117)
Anion gap: 12 (ref 5–15)
BUN: 58 mg/dL — ABNORMAL HIGH (ref 6–23)
CALCIUM: 9.8 mg/dL (ref 8.4–10.5)
CO2: 27 mEq/L (ref 19–32)
Chloride: 103 mEq/L (ref 96–112)
Creatinine, Ser: 2.34 mg/dL — ABNORMAL HIGH (ref 0.50–1.35)
GFR calc Af Amer: 29 mL/min — ABNORMAL LOW (ref >90)
GFR calc non Af Amer: 25 mL/min — ABNORMAL LOW (ref >90)
Glucose, Bld: 134 mg/dL — ABNORMAL HIGH (ref 70–99)
Potassium: 4.6 mEq/L (ref 3.7–5.3)
SODIUM: 142 meq/L (ref 137–147)
TOTAL PROTEIN: 7 g/dL (ref 6.0–8.3)
Total Bilirubin: 0.4 mg/dL (ref 0.3–1.2)

## 2013-11-16 LAB — GLUCOSE, CAPILLARY
GLUCOSE-CAPILLARY: 161 mg/dL — AB (ref 70–99)
Glucose-Capillary: 115 mg/dL — ABNORMAL HIGH (ref 70–99)
Glucose-Capillary: 203 mg/dL — ABNORMAL HIGH (ref 70–99)
Glucose-Capillary: 170 mg/dL — ABNORMAL HIGH (ref 70–99)

## 2013-11-16 LAB — PHENYTOIN LEVEL, TOTAL: Phenytoin Lvl: 19.6 ug/mL (ref 10.0–20.0)

## 2013-11-16 MED ORDER — TORSEMIDE 20 MG PO TABS
20.0000 mg | ORAL_TABLET | Freq: Two times a day (BID) | ORAL | Status: DC
  Administered 2013-11-16 – 2013-11-18 (×4): 20 mg via ORAL
  Filled 2013-11-16 (×4): qty 1

## 2013-11-16 NOTE — Evaluation (Signed)
Physical Therapy Evaluation Patient Details Name: Donald Owen MRN: [id number] DOB: 1933/07/29 Today's Date: 11/16/2013   History of Present Illness  Donald Owen is a 80 y.o. male with a Past Medical History of chronic combined systolic and diastolic heart failure EF 45%, hypothyroidism, diabetes mellitus type 2 now insulin-dependent, hypertension, chronic kidney disease stage III,COPD on 2-1/2 L nasal cannula oxygen at home, CVA with very minimal right sided hemiparesis, hypertension  who presents today with the above noted complaint. Please note, patient has a history of dementia, and is a unreliable historian, most of this history is obtained after speaking with the patient's daughter at bedside. Apparently, this morning patient's daughter noticed that patient was not his usual self-she thought that the patient was labored and short of breath when walking. As a result EMS was called, patient was brought to the emergency room, where further evaluation was consistent with acute congestive heart failure. I was asked to admit this patient for further evaluation and treatment. Apparently, while at the field, EMS found that patient was hypoxic with his oxygen saturation to 80s. Patient apparently is supposed to be on 2 L of oxygen 24/7, however patient has been noncompliant.  Clinical Impression  Pt is a 80 year old male who presents to physical therapy for assessment of functional mobility skills.  Pt is a poor historian, and history received from daughter (not daughter pt lives with).  Pt lives with his daughter and son in law in a home with a ramped entrance; daughter and son in law are available 24/7 as needed. Prior to his hospitalization, the pt required minimal assistance with bed mobility skills, transfers, and household amb with use of RW; family helped "if he needed it".  Family does report he required assistance with ADLs.  During evaluation, pt required mod/max assist for supine -> sit and  min/mod assist for sit -> supine.  Despite max assist and height of bed raised, pt was unable to transfer into standing; attempted x3 with max assist and max VC for technique.  No pain reported with mobility, only unable per pt report.  Amb not attempted as pt unable to transfer to standing.  Pt unable to follow commands for MMT assessment, though noted pt able to go through ~50% ROM during therapeutic exercises, with increased difficulty on Rt > Lt side required AAROM to attempt movement of that side; pt demonstrates generalized weakness in (B) LE.  Recommend continued PT while in the hospital to address strengthening, balance, activity tolerance for improvement of functional mobility skills with transition to SNF for continued rehab.  Verbalized recommendation to family, however family denies need for SNF and would prefer to take pt home.  Educated family (daughter whom pt lives with, as she was present after assessment of functional mobility skills was completed) on how much assist pt required with mobility skills and benefits for SNF vs. HHPT, however pt family continues to deny need for SNF and would prefer HHPT.  Recommend use of W/C for functional mobility if pt is to return home, as pt is currently unable to amb.       Follow Up Recommendations SNF    Equipment Recommendations  Wheelchair (measurements PT) (Possible use of W/C as pt will required increased assistance if he is to return home per family request)    Recommendations for Other Services OT consult     Precautions / Restrictions Precautions Precautions: Fall Restrictions Weight Bearing Restrictions: No      Mobility  Bed Mobility Overal bed mobility: Needs Assistance Bed Mobility: Supine to Sit;Sit to Supine     Supine to sit: HOB elevated;Mod assist;Max assist (for trunk and LE, VC for technique) Sit to supine: HOB elevated;Min assist;Mod assist (for LE, VC for technique)      Transfers Overall transfer level: Needs  assistance Equipment used: Rolling walker (2 wheeled) Transfers: Sit to/from Stand Sit to Stand: From elevated surface;Max assist         General transfer comment: Attempted sit <-> stand transfer x3 with increasing height of bed to decrease difficulty.  Pt unable to transfer completely upright secondary to weakness despite max assist.  VC for hand and foot placement during transfers.   Ambulation/Gait             General Gait Details: Not attempted as pt unable to fully stand upright.        Balance Overall balance assessment: Needs assistance Sitting-balance support: Feet supported;Bilateral upper extremity supported Sitting balance-Leahy Scale: Fair     Standing balance support: Bilateral upper extremity supported;During functional activity (On RW with max assist to attempt to stand. ) Standing balance-Leahy Scale: Poor                               Pertinent Vitals/Pain PAINAD 0    Home Living Family/patient expects to be discharged to:: Unsure (Recommend SNF, however family (POA) would rather pt go home) Living Arrangements: Children (Lives with daughter and son in law)               Additional Comments: Pt lives in a single story home with a ramped entrance.  He lives with his daughter and son in law who are available 24/7 to assist as needed.  DME: walker, hospital bed, BSC    Prior Function Level of Independence: Needs assistance   Gait / Transfers Assistance Needed: Per daughter (not daughter pt lives with), pt required minimal assist with bed mobility, transfers, and household amb skills with use of walker.  Family does report he was having increasing difficulty getting pt in/out of a car.   ADL's / Homemaking Assistance Needed: Per daughter (not daughter pt lives with), pt required some assistance with bed bathing and dressing.    At end of evaluation, observed family cutting up food and feeding pt.             Extremity/Trunk  Assessment               Lower Extremity Assessment: Generalized weakness;RLE deficits/detail;LLE deficits/detail RLE Deficits / Details: Unable to follow commands for MMT, though pt able to move ankle, knees, hips through ~50% of ROM during therapeutic exercises.  LLE Deficits / Details: Unable to follow commands for MMT, though pt able to move ankles, knees, hips through ~50% of ROM during therapeutic exercises.       Communication   Communication: No difficulties  Cognition Arousal/Alertness: Awake/alert Behavior During Therapy: WFL for tasks assessed/performed Overall Cognitive Status: History of cognitive impairments - at baseline                         Exercises Total Joint Exercises Ankle Circles/Pumps: AROM;AAROM;Both;5 reps Short Arc Quad: AROM;AAROM;Both;5 reps (AROM Lt, AAROM Rt) General Exercises - Lower Extremity Hip Flexion/Marching: AROM;AAROM;Both;5 reps;Seated (AROM Lt, AAROM Rt though ~50% of ROM)      Assessment/Plan    PT Assessment Patient needs continued  PT services  PT Diagnosis Difficulty walking;Generalized weakness   PT Problem List Decreased strength;Decreased cognition;Decreased range of motion;Decreased activity tolerance;Decreased safety awareness;Decreased balance;Decreased mobility  PT Treatment Interventions Balance training;Gait training;Neuromuscular re-education;Functional mobility training;Therapeutic activities;Therapeutic exercise;Patient/family education   PT Goals (Current goals can be found in the Care Plan section) Acute Rehab PT Goals Patient Stated Goal: go home PT Goal Formulation: With family Time For Goal Achievement: 11/30/13 Potential to Achieve Goals: Good    Frequency Min 3X/week    End of Session Equipment Utilized During Treatment: Gait belt;Oxygen (Continuous O2 use at 3L in hospital (normally 2 1/2L)) Activity Tolerance: Patient limited by fatigue Patient left: in bed;with call bell/phone within  reach;with bed alarm set;with family/visitor present           Time: 0822-0855 PT Time Calculation (min): 33 min   Charges:   PT Evaluation $Initial PT Evaluation Tier I: 1 Procedure PT Treatments $Therapeutic Exercise: 8-22 mins    Kimia Finan 11/16/2013, 9:12 AM

## 2013-11-16 NOTE — Progress Notes (Signed)
Patient ID: Donald Owen, male   DOB: 1933-12-14, 80 y.o.   MRN: [id number]    Subjective:    Mild SOB  Objective:   Temp:  [97.4 F (36.3 C)-97.9 F (36.6 C)] 97.4 F (36.3 C) (07/13 0657) Pulse Rate:  [54-62] 62 (07/13 0657) Resp:  [12-18] 12 (07/13 0657) BP: (110-121)/(31-34) 121/32 mmHg (07/13 0657) SpO2:  [97 %-100 %] 99 % (07/13 0657) Weight:  [194 lb 7.1 oz (88.2 kg)] 194 lb 7.1 oz (88.2 kg) (07/13 0353) Last BM Date: 11/15/13  Filed Weights   11/14/13 0441 11/15/13 0500 11/16/13 0353  Weight: 188 lb 11.4 oz (85.6 kg) 192 lb 0.3 oz (87.1 kg) 194 lb 7.1 oz (88.2 kg)    Intake/Output Summary (Last 24 hours) at 11/16/13 0835 Last data filed at 11/16/13 0500  Gross per 24 hour  Intake    840 ml  Output   1125 ml  Net   -285 ml    Telemetry: SR, sinus brady, 1st degree AV block  Exam:  General: NAD  Resp: faint crackles bilateral bases  Cardiac: RRR, no m/r/g, no JVD  NW:GNFAOZH soft, NT, ND  MSK: no LE edema  Neuro: no focal deficits  Psych: appropriate affect  Lab Results:  Basic Metabolic Panel:  Recent Labs Lab 11/13/13 0609 11/14/13 0547 11/16/13 0644  NA 148* 145 142  K 4.4 3.9 4.6  CL 108 102 103  CO2 31 31 27   GLUCOSE 63* 154* 134*  BUN 55* 43* 58*  CREATININE 2.09* 1.76* 2.34*  CALCIUM 10.0 10.1 9.8    Liver Function Tests:  Recent Labs Lab 11/11/13 1344 11/16/13 0644  AST 17 23  ALT 12 10  ALKPHOS 18* 26*  BILITOT 0.4 0.4  PROT 7.5 7.0  ALBUMIN 3.5 2.8*    CBC:  Recent Labs Lab 11/11/13 1344 11/16/13 0644  WBC 5.6 6.2  HGB 12.3* 11.8*  HCT 38.1* 36.3*  MCV 95.3 94.5  PLT 151 178    Cardiac Enzymes:  Recent Labs Lab 11/11/13 1620 11/11/13 2203 11/12/13 0419  TROPONINI <0.30 <0.30 <0.30    BNP:  Recent Labs  08/19/13 1632 11/11/13 1344  PROBNP 2134.0* 2652.0*    Coagulation:  Recent Labs Lab 11/11/13 1344  INR 1.17    ECG:   Medications:   Scheduled Medications: . allopurinol  300  mg Oral q morning - 10a  . amiodarone  200 mg Oral Daily  . amLODipine  5 mg Oral q morning - 10a  . atorvastatin  40 mg Oral QHS  . carvedilol  25 mg Oral BID WC  . dipyridamole-aspirin  1 capsule Oral BID  . fenofibrate  160 mg Oral q morning - 10a  . furosemide  40 mg Intravenous Q12H  . heparin  5,000 Units Subcutaneous 3 times per day  . hydrALAZINE  50 mg Oral QID  . insulin aspart  0-9 Units Subcutaneous TID WC  . isosorbide mononitrate  30 mg Oral Daily  . levETIRAcetam  500 mg Oral BID  . levothyroxine  225 mcg Oral QAC breakfast  . phenytoin  100 mg Oral Daily  . phenytoin  300 mg Oral QHS  . potassium chloride  20 mEq Oral TID  . sodium chloride  3 mL Intravenous Q12H  . tamsulosin  0.4 mg Oral BID     Infusions:     PRN Medications:  sodium chloride, acetaminophen, albuterol, ALPRAZolam, dextrose, glucose-Vitamin C, haloperidol lactate, LORazepam, nitroGLYCERIN, ondansetron (ZOFRAN) IV, sodium chloride  Assessment/Plan   80 yo male with history of CAD with prior CABG, ICM LVEF 45%, restrictive diastolic dysfunction DM II, CKD, COPD on home O2, CVA admitted with acute on chronic systolic heart failure  1. Acute on chronic combined systolic and diastolic heart failure - last echo 08/2013 LVEF 45-50%, restrictive diastolic dysfunction - negative 300 mL yesterday, negative 9.6 liters since admission. Cr up to 2.34, from lab review fairly labile renal function labile ranging from 1.7-2.4, current GFR 25. Currently on lasix 40mg  bid.  - he is not on ACE or sprionolactone due to poor renal function - will stop IV lasix due to Cr bump, change to oral torsemide 20mg  bid. He has already received a dose of IV lasix this morning, will start torsemide with evening dose         Dina RichJonathan Desaree Downen, M.D., F.A.C.C.

## 2013-11-16 NOTE — Clinical Social Work Psychosocial (Signed)
Clinical Social Work Department BRIEF PSYCHOSOCIAL ASSESSMENT 11/16/2013  Patient:  Donald Owen,Donald Owen     Account Number:  [id number]     Admit date:  11/11/2013  Clinical Social Worker:  ,, LCSW  Date/Time:  11/16/2013 11:22 AM  Referred by:  CSW  Date Referred:  11/16/2013 Referred for  SNF Placement   Other Referral:   Interview type:  Family Other interview type:   daughter- Donald Owen    PSYCHOSOCIAL DATA Living Status:  FAMILY Admitted from facility:   Level of care:   Primary support name:  Donald Owen Primary support relationship to patient:  CHILD, ADULT Degree of support available:   supportive    CURRENT CONCERNS Current Concerns  Post-Acute Placement   Other Concerns:    SOCIAL WORK ASSESSMENT / PLAN CSW met with pt's daughter Donald Owen at bedside. Pt sleeping during assessment. Donald Owen reports pt lives with her and she assists him with ADLs. Donald Owen appears to be involved and supportive. She states she also cares for her husband at home. Donald Owen is at home with them around the clock. PT evaluated pt this morning and recommendation is for SNF. CSW discussed with this Donald Owen who refuses. She states they had Owen bad experience in the past, but the main reason is because she is caring for her husband as well, it is easier on her to have them together. Pt has all necessary equipment at home. She states they would like to have home health services again. Donald Owen was present for PT evaluation and states she understands what pt will require for care at home. MD also discussed with Donald Owen.  SNF list provided in case needed after d/c. She is aware of insurance authorization requirement.   Assessment/plan status:  Referral to Community Resources Other assessment/ plan:   Information/referral to community resources:   CM for home health  SNF list    PATIENT'S/FAMILY'S RESPONSE TO PLAN OF CARE: Pt sleeping. Daughter states she plans to take pt home at d/c. CSW notified CM and will sign off.         , LCSW [phone] 

## 2013-11-16 NOTE — Progress Notes (Signed)
Subjective: He says he feels better. He remains more alert. He has no new complaints.  Objective: Vital signs in last 24 hours: Temp:  [97.4 F (36.3 C)-97.9 F (36.6 C)] 97.4 F (36.3 C) (07/13 0657) Pulse Rate:  [54-62] 62 (07/13 0657) Resp:  [12-18] 12 (07/13 0657) BP: (110-121)/(31-34) 121/32 mmHg (07/13 0657) SpO2:  [97 %-100 %] 99 % (07/13 0657) Weight:  [88.2 kg (194 lb 7.1 oz)] 88.2 kg (194 lb 7.1 oz) (07/13 0353) Weight change: 1.1 kg (2 lb 6.8 oz) Last BM Date: 11/15/13  Intake/Output from previous day: 07/12 0701 - 07/13 0700 In: 840 [P.O.:840] Out: 1125 [Urine:1125]  PHYSICAL EXAM General appearance: alert, cooperative and mild distress Resp: rhonchi bilaterally Cardio: regular rate and rhythm, S1, S2 normal, no murmur, click, rub or gallop GI: soft, non-tender; bowel sounds normal; no masses,  no organomegaly Extremities: Trace edema with multiple scars on his legs  Lab Results:  Results for orders placed during the hospital encounter of 11/11/13 (from the past 48 hour(s))  GLUCOSE, CAPILLARY     Status: Abnormal   Collection Time    11/14/13 11:13 AM      Result Value Ref Range   Glucose-Capillary 202 (*) 70 - 99 mg/dL   Comment 1 Documented in Chart     Comment 2 Notify RN    GLUCOSE, CAPILLARY     Status: Abnormal   Collection Time    11/14/13  4:17 PM      Result Value Ref Range   Glucose-Capillary 179 (*) 70 - 99 mg/dL  GLUCOSE, CAPILLARY     Status: Abnormal   Collection Time    11/14/13  9:57 PM      Result Value Ref Range   Glucose-Capillary 131 (*) 70 - 99 mg/dL  GLUCOSE, CAPILLARY     Status: Abnormal   Collection Time    11/15/13  7:51 AM      Result Value Ref Range   Glucose-Capillary 144 (*) 70 - 99 mg/dL   Comment 1 Documented in Chart     Comment 2 Notify RN    GLUCOSE, CAPILLARY     Status: Abnormal   Collection Time    11/15/13 11:36 AM      Result Value Ref Range   Glucose-Capillary 184 (*) 70 - 99 mg/dL   Comment 1  Documented in Chart     Comment 2 Notify RN    GLUCOSE, CAPILLARY     Status: Abnormal   Collection Time    11/15/13  5:05 PM      Result Value Ref Range   Glucose-Capillary 132 (*) 70 - 99 mg/dL  GLUCOSE, CAPILLARY     Status: Abnormal   Collection Time    11/15/13  9:58 PM      Result Value Ref Range   Glucose-Capillary 144 (*) 70 - 99 mg/dL   Comment 1 Notify RN     Comment 2 Documented in Chart    CBC WITH DIFFERENTIAL     Status: Abnormal   Collection Time    11/16/13  6:44 AM      Result Value Ref Range   WBC 6.2  4.0 - 10.5 K/uL   RBC 3.84 (*) 4.22 - 5.81 MIL/uL   Hemoglobin 11.8 (*) 13.0 - 17.0 g/dL   HCT 36.3 (*) 39.0 - 52.0 %   MCV 94.5  78.0 - 100.0 fL   MCH 30.7  26.0 - 34.0 pg   MCHC 32.5  30.0 -  36.0 g/dL   RDW 17.8 (*) 11.5 - 15.5 %   Platelets 178  150 - 400 K/uL   Neutrophils Relative % 72  43 - 77 %   Neutro Abs 4.4  1.7 - 7.7 K/uL   Lymphocytes Relative 13  12 - 46 %   Lymphs Abs 0.8  0.7 - 4.0 K/uL   Monocytes Relative 13 (*) 3 - 12 %   Monocytes Absolute 0.8  0.1 - 1.0 K/uL   Eosinophils Relative 2  0 - 5 %   Eosinophils Absolute 0.1  0.0 - 0.7 K/uL   Basophils Relative 0  0 - 1 %   Basophils Absolute 0.0  0.0 - 0.1 K/uL  PHENYTOIN LEVEL, TOTAL     Status: None   Collection Time    11/16/13  6:44 AM      Result Value Ref Range   Phenytoin Lvl 19.6  10.0 - 20.0 ug/mL  COMPREHENSIVE METABOLIC PANEL     Status: Abnormal   Collection Time    11/16/13  6:44 AM      Result Value Ref Range   Sodium 142  137 - 147 mEq/Owen   Potassium 4.6  3.7 - 5.3 mEq/Owen   Chloride 103  96 - 112 mEq/Owen   CO2 27  19 - 32 mEq/Owen   Glucose, Bld 134 (*) 70 - 99 mg/dL   BUN 58 (*) 6 - 23 mg/dL   Creatinine, Ser 2.34 (*) 0.50 - 1.35 mg/dL   Calcium 9.8  8.4 - 10.5 mg/dL   Total Protein 7.0  6.0 - 8.3 g/dL   Albumin 2.8 (*) 3.5 - 5.2 g/dL   AST 23  0 - 37 U/Owen   ALT 10  0 - 53 U/Owen   Alkaline Phosphatase 26 (*) 39 - 117 U/Owen   Total Bilirubin 0.4  0.3 - 1.2 mg/dL   GFR calc  non Af Amer 25 (*) >90 mL/min   GFR calc Af Amer 29 (*) >90 mL/min   Comment: (NOTE)     The eGFR has been calculated using the CKD EPI equation.     This calculation has not been validated in all clinical situations.     eGFR's persistently <90 mL/min signify possible Chronic Kidney     Disease.   Anion gap 12  5 - 15  GLUCOSE, CAPILLARY     Status: Abnormal   Collection Time    11/16/13  7:10 AM      Result Value Ref Range   Glucose-Capillary 115 (*) 70 - 99 mg/dL   Comment 1 Notify RN     Comment 2 Documented in Chart      ABGS No results found for this basename: PHART, PCO2, PO2ART, TCO2, HCO3,  in the last 72 hours CULTURES No results found for this or any previous visit (from the past 240 hour(s)). Studies/Results: No results found.  Medications:  Prior to Admission:  Prescriptions prior to admission  Medication Sig Dispense Refill  . allopurinol (ZYLOPRIM) 300 MG tablet Take 300 mg by mouth every morning.       Marland Kitchen ALPRAZolam (XANAX) 0.5 MG tablet Take 0.5 mg by mouth 3 (three) times daily as needed. For anxiety      . amLODipine (NORVASC) 5 MG tablet Take 5 mg by mouth every morning.       Marland Kitchen atorvastatin (LIPITOR) 80 MG tablet Take 40 mg by mouth at bedtime.       . carvedilol (COREG) 25 MG  tablet Take 25 mg by mouth 2 (two) times daily with a meal.      . dipyridamole-aspirin (AGGRENOX) 25-200 MG per 12 hr capsule Take 1 capsule by mouth 2 (two) times daily.      . fenofibrate 160 MG tablet Take 160 mg by mouth every morning.       . furosemide (LASIX) 40 MG tablet Take 1 tablet (40 mg total) by mouth daily. May take twice daily if needed  30 tablet  12  . glimepiride (AMARYL) 4 MG tablet Take 4 mg by mouth 2 (two) times daily with a meal.       . hydrALAZINE (APRESOLINE) 50 MG tablet Take 50 mg by mouth 4 (four) times daily.      Marland Kitchen HYDROcodone-acetaminophen (NORCO/VICODIN) 5-325 MG per tablet Take 1 tablet by mouth every 6 (six) hours as needed for pain.      Marland Kitchen insulin  glargine (LANTUS) 100 UNIT/ML injection Inject 25 Units into the skin at bedtime.      . isosorbide mononitrate (IMDUR) 30 MG 24 hr tablet Take 1 tablet (30 mg total) by mouth daily.  30 tablet  12  . levETIRAcetam (KEPPRA) 500 MG tablet Take 500 mg by mouth 2 (two) times daily.      Marland Kitchen levothyroxine (SYNTHROID, LEVOTHROID) 200 MCG tablet Takes 224mg tablet and 25 mcg tablet for total dose of 2224m      . levothyroxine (SYNTHROID, LEVOTHROID) 25 MCG tablet Takes 20029mtablet and 25 mcg tablet for total dose of 225m35m    . losartan (COZAAR) 25 MG tablet Take 25 mg by mouth daily.      . metolazone (ZAROXOLYN) 5 MG tablet Take 5 mg by mouth 2 (two) times daily as needed. Swelling      . phenytoin (DILANTIN) 100 MG ER capsule Take 100-300 mg by mouth 2 (two) times daily. Patient take 100 in the morning and 300 at night      . potassium chloride (K-DUR) 10 MEQ tablet Take 20 mEq by mouth 3 (three) times daily.       . spMarland Kitchenronolactone (ALDACTONE) 12.5 mg TABS Take 0.5 tablets (12.5 mg total) by mouth daily.  30 tablet  12  . Tamsulosin HCl (FLOMAX) 0.4 MG CAPS Take 0.4 mg by mouth 2 (two) times daily.      . amMarland Kitchenodarone (PACERONE) 200 MG tablet Take 1 tablet (200 mg total) by mouth daily.  60 tablet  6  . ciprofloxacin (CILOXAN) 0.3 % ophthalmic solution Place 1 drop into both eyes 2 (two) times daily. *ONLY USES AS NEEDED FOR FLARE*      . nitroGLYCERIN (NITROSTAT) 0.4 MG SL tablet Place 0.4 mg under the tongue every 5 (five) minutes x 3 doses as needed for chest pain.      . promethazine (PHENERGAN) 25 MG tablet Take 1 tablet by mouth daily as needed for nausea.        Scheduled: . allopurinol  300 mg Oral q morning - 10a  . amiodarone  200 mg Oral Daily  . amLODipine  5 mg Oral q morning - 10a  . atorvastatin  40 mg Oral QHS  . carvedilol  25 mg Oral BID WC  . dipyridamole-aspirin  1 capsule Oral BID  . fenofibrate  160 mg Oral q morning - 10a  . furosemide  40 mg Intravenous Q12H  . heparin   5,000 Units Subcutaneous 3 times per day  . hydrALAZINE  50 mg Oral QID  .  insulin aspart  0-9 Units Subcutaneous TID WC  . isosorbide mononitrate  30 mg Oral Daily  . levETIRAcetam  500 mg Oral BID  . levothyroxine  225 mcg Oral QAC breakfast  . phenytoin  100 mg Oral Daily  . phenytoin  300 mg Oral QHS  . potassium chloride  20 mEq Oral TID  . sodium chloride  3 mL Intravenous Q12H  . tamsulosin  0.4 mg Oral BID   Continuous:  TIJ:FTZOQX chloride, acetaminophen, albuterol, ALPRAZolam, dextrose, glucose-Vitamin C, haloperidol lactate, LORazepam, nitroGLYCERIN, ondansetron (ZOFRAN) IV, sodium chloride  Assesment: He was admitted with acute on chronic combined systolic and diastolic heart failure. He had a metabolic encephalopathy which has cleared. He is known to have coronary artery occlusive disease. He has some element of dementia. He also has some element of COPD. Principal Problem:   Acute on chronic combined systolic and diastolic CHF, NYHA class 3 Active Problems:   HYPOTHYROIDISM   HYPERTENSION   Wide-complex tachycardia   Arteriosclerotic cardiovascular disease (ASCVD)   Diabetes mellitus, type 2   Dementia with behavioral disturbance   Chronic hypoxemic respiratory failure   Encephalopathy, metabolic    Plan: He will continue treatments. I have requested physical therapy consult    LOS: 5 days   Donald Owen 11/16/2013, 8:19 AM

## 2013-11-17 ENCOUNTER — Inpatient Hospital Stay (HOSPITAL_COMMUNITY): Payer: Medicare Other

## 2013-11-17 LAB — GLUCOSE, CAPILLARY
GLUCOSE-CAPILLARY: 163 mg/dL — AB (ref 70–99)
GLUCOSE-CAPILLARY: 161 mg/dL — AB (ref 70–99)
Glucose-Capillary: 151 mg/dL — ABNORMAL HIGH (ref 70–99)
Glucose-Capillary: 150 mg/dL — ABNORMAL HIGH (ref 70–99)

## 2013-11-17 LAB — BASIC METABOLIC PANEL
Anion gap: 10 (ref 5–15)
BUN: 57 mg/dL — AB (ref 6–23)
CHLORIDE: 103 meq/L (ref 96–112)
CO2: 27 mEq/L (ref 19–32)
Calcium: 9.9 mg/dL (ref 8.4–10.5)
Creatinine, Ser: 2.1 mg/dL — ABNORMAL HIGH (ref 0.50–1.35)
GFR calc Af Amer: 33 mL/min — ABNORMAL LOW (ref >90)
GFR calc non Af Amer: 29 mL/min — ABNORMAL LOW (ref >90)
GLUCOSE: 165 mg/dL — AB (ref 70–99)
Potassium: 4.7 mEq/L (ref 3.7–5.3)
Sodium: 140 mEq/L (ref 137–147)

## 2013-11-17 MED ORDER — CARVEDILOL 12.5 MG PO TABS
12.5000 mg | ORAL_TABLET | Freq: Two times a day (BID) | ORAL | Status: DC
  Administered 2013-11-18: 12.5 mg via ORAL
  Filled 2013-11-17: qty 1

## 2013-11-17 MED ORDER — ISOSORBIDE MONONITRATE ER 30 MG PO TB24
15.0000 mg | ORAL_TABLET | Freq: Every day | ORAL | Status: DC
  Administered 2013-11-17 – 2013-11-18 (×2): 15 mg via ORAL
  Filled 2013-11-17 (×2): qty 1

## 2013-11-17 NOTE — Care Management Note (Signed)
    Page 1 of 1   11/18/2013     4:33:40 PM CARE MANAGEMENT NOTE 11/18/2013  Patient:  [name],Drako A   Account Number:  [id number]  Date Initiated:  11/17/2013  Documentation initiated by:  Anibal HendersonBOLDEN,Denijah Karrer  Subjective/Objective Assessment:   pt admitted with CHF. He is from home with daughter and she plans to take him back home. They have [facility] active in the home,and she would like to continue with them.     Action/Plan:   She states she has no DME needs. she has everything she needs.   Anticipated DC Date:  11/18/2013   Anticipated DC Plan:  HOME W HOME HEALTH SERVICES      DC Planning Services  CM consult      [facility]  Resumption Of Svcs/PTA Provider   Choice offered to / List presented to:  C-4 Adult Children        HH arranged  HH-1 RN  HH-2 PT  HH-4 NURSE'S AIDE      HH agency  [facility]   Status of service:  Completed, signed off Medicare Important Message given?  YES (If response is "NO", the following Medicare IM given date fields will be blank) Date Medicare IM given:  11/17/2013 Medicare IM given by:  Anibal HendersonBOLDEN,Rhaya Coale Date Additional Medicare IM given:   Additional Medicare IM given by:    Discharge Disposition:  HOME W HOME HEALTH SERVICES  Per UR Regulation:  Reviewed for med. necessity/level of care/duration of stay  If discussed at Long Length of Stay Meetings, dates discussed:    Comments:  11/18/13 1630 Darina Hartwell RN/CM 11/17/13 1230 Anibal HendersonGeneva Laquentin Loudermilk RN/CM

## 2013-11-17 NOTE — Progress Notes (Signed)
Patient ID: Donald Owen, male   DOB: Jun 23, 1933, 80 y.o.   MRN: [id number]     Subjective:    No compliants  Objective:   Temp:  [97.7 F (36.5 C)-98.8 F (37.1 C)] 98.8 F (37.1 C) (07/14 0703) Pulse Rate:  [51-65] 65 (07/14 0703) Resp:  [16-18] 18 (07/14 0703) BP: (104-136)/(27-50) 124/37 mmHg (07/14 0703) SpO2:  [92 %-100 %] 98 % (07/14 0703) Weight:  [194 lb 7.1 oz (88.2 kg)] 194 lb 7.1 oz (88.2 kg) (07/14 0500) Last BM Date: 11/16/13  Filed Weights   11/15/13 0500 11/16/13 0353 11/17/13 0500  Weight: 192 lb 0.3 oz (87.1 kg) 194 lb 7.1 oz (88.2 kg) 194 lb 7.1 oz (88.2 kg)    Intake/Output Summary (Last 24 hours) at 11/17/13 0819 Last data filed at 11/17/13 0515  Gross per 24 hour  Intake    600 ml  Output    700 ml  Net   -100 ml    Telemetry: SR and sinus brady  Exam:  General: NAD  Resp: clear anteriorally  Cardiac: RRR, no m/r/g, no JVD  WJ:[id number]:abdomen soft, NT, ND  MSK: no LE edema    Lab Results:  Basic Metabolic Panel:  Recent Labs Lab 11/13/13 0609 11/14/13 0547 11/16/13 0644  NA 148* 145 142  K 4.4 3.9 4.6  CL 108 102 103  CO2 31 31 27   GLUCOSE 63* 154* 134*  BUN 55* 43* 58*  CREATININE 2.09* 1.76* 2.34*  CALCIUM 10.0 10.1 9.8    Liver Function Tests:  Recent Labs Lab 11/11/13 1344 11/16/13 0644  AST 17 23  ALT 12 10  ALKPHOS 18* 26*  BILITOT 0.4 0.4  PROT 7.5 7.0  ALBUMIN 3.5 2.8*    CBC:  Recent Labs Lab 11/11/13 1344 11/16/13 0644  WBC 5.6 6.2  HGB 12.3* 11.8*  HCT 38.1* 36.3*  MCV 95.3 94.5  PLT 151 178    Cardiac Enzymes:  Recent Labs Lab 11/11/13 1620 11/11/13 2203 11/12/13 0419  TROPONINI <0.30 <0.30 <0.30    BNP:  Recent Labs  08/19/13 1632 11/11/13 1344  PROBNP 2134.0* 2652.0*    Coagulation:  Recent Labs Lab 11/11/13 1344  INR 1.17    ECG:   Medications:   Scheduled Medications: . allopurinol  300 mg Oral q morning - 10a  . amiodarone  200 mg Oral Daily  . amLODipine  5  mg Oral q morning - 10a  . atorvastatin  40 mg Oral QHS  . carvedilol  25 mg Oral BID WC  . dipyridamole-aspirin  1 capsule Oral BID  . fenofibrate  160 mg Oral q morning - 10a  . heparin  5,000 Units Subcutaneous 3 times per day  . hydrALAZINE  50 mg Oral QID  . insulin aspart  0-9 Units Subcutaneous TID WC  . isosorbide mononitrate  30 mg Oral Daily  . levETIRAcetam  500 mg Oral BID  . levothyroxine  225 mcg Oral QAC breakfast  . phenytoin  100 mg Oral Daily  . phenytoin  300 mg Oral QHS  . potassium chloride  20 mEq Oral TID  . sodium chloride  3 mL Intravenous Q12H  . tamsulosin  0.4 mg Oral BID  . torsemide  20 mg Oral BID     Infusions:     PRN Medications:  sodium chloride, acetaminophen, albuterol, ALPRAZolam, dextrose, glucose-Vitamin C, haloperidol lactate, LORazepam, nitroGLYCERIN, ondansetron (ZOFRAN) IV, sodium chloride     Assessment/Plan   1. Acute on chronic  combined systolic and diastolic heart failure  - last echo 08/2013 LVEF 45-50%, restrictive diastolic dysfunction  - +140 mL yesterday, negative 9.5 liters since admission. Cr up to 2.34 yesterday, from lab review fairly labile renal function labile ranging from 1.7-2.4. No repeat labs today. Changed to oral torsemide last night 20mg  bid. Appears near euvolemic.  - he is not on ACE or sprionolactone due to poor renal function  - will add on BMET for today - soft bp's at times, decrease imdur to 15mg  daily.   2. Hx of VT - continue amiodarone and beta blocker.   3. Sinus bradycardia - received AM dose of coreg, evening dose held due to bradycardia.  - noted rates in low 50s high 40s at times on tele, will decrease coreg to 12.5mg  bid.   4. COPD - on home O2, currently at baseline O2 need in hospital  Dina Rich, M.D., F.A.C.C.

## 2013-11-17 NOTE — Progress Notes (Signed)
Subjective: He says he feels well. He has no complaints. His breathing is doing okay. Cardiology input and help is noted and appreciated. His basic metabolic profile is still pending  Objective: Vital signs in last 24 hours: Temp:  [97.7 F (36.5 C)-98.8 F (37.1 C)] 98.8 F (37.1 C) (07/14 0703) Pulse Rate:  [51-65] 65 (07/14 0703) Resp:  [16-18] 18 (07/14 0703) BP: (104-136)/(27-50) 124/37 mmHg (07/14 0703) SpO2:  [92 %-100 %] 98 % (07/14 0703) Weight:  [88.2 kg (194 lb 7.1 oz)] 88.2 kg (194 lb 7.1 oz) (07/14 0500) Weight change: 0 kg (0 lb) Last BM Date: 11/16/13  Intake/Output from previous day: 07/13 0701 - 07/14 0700 In: 840 [P.O.:720; I.V.:120] Out: 700 [Urine:700]  PHYSICAL EXAM General appearance: alert, cooperative and mild distress Resp: rhonchi bilaterally Cardio: regular rate and rhythm, S1, S2 normal, no murmur, click, rub or gallop GI: soft, non-tender; bowel sounds normal; no masses,  no organomegaly Extremities: extremities normal, atraumatic, no cyanosis or edema  Lab Results:  Results for orders placed during the hospital encounter of 11/11/13 (from the past 48 hour(s))  GLUCOSE, CAPILLARY     Status: Abnormal   Collection Time    11/15/13 11:36 AM      Result Value Ref Range   Glucose-Capillary 184 (*) 70 - 99 mg/dL   Comment 1 Documented in Chart     Comment 2 Notify RN    GLUCOSE, CAPILLARY     Status: Abnormal   Collection Time    11/15/13  5:05 PM      Result Value Ref Range   Glucose-Capillary 132 (*) 70 - 99 mg/dL  GLUCOSE, CAPILLARY     Status: Abnormal   Collection Time    11/15/13  9:58 PM      Result Value Ref Range   Glucose-Capillary 144 (*) 70 - 99 mg/dL   Comment 1 Notify RN     Comment 2 Documented in Chart    CBC WITH DIFFERENTIAL     Status: Abnormal   Collection Time    11/16/13  6:44 AM      Result Value Ref Range   WBC 6.2  4.0 - 10.5 K/uL   RBC 3.84 (*) 4.22 - 5.81 MIL/uL   Hemoglobin 11.8 (*) 13.0 - 17.0 g/dL   HCT  36.3 (*) 39.0 - 52.0 %   MCV 94.5  78.0 - 100.0 fL   MCH 30.7  26.0 - 34.0 pg   MCHC 32.5  30.0 - 36.0 g/dL   RDW 17.8 (*) 11.5 - 15.5 %   Platelets 178  150 - 400 K/uL   Neutrophils Relative % 72  43 - 77 %   Neutro Abs 4.4  1.7 - 7.7 K/uL   Lymphocytes Relative 13  12 - 46 %   Lymphs Abs 0.8  0.7 - 4.0 K/uL   Monocytes Relative 13 (*) 3 - 12 %   Monocytes Absolute 0.8  0.1 - 1.0 K/uL   Eosinophils Relative 2  0 - 5 %   Eosinophils Absolute 0.1  0.0 - 0.7 K/uL   Basophils Relative 0  0 - 1 %   Basophils Absolute 0.0  0.0 - 0.1 K/uL  PHENYTOIN LEVEL, TOTAL     Status: None   Collection Time    11/16/13  6:44 AM      Result Value Ref Range   Phenytoin Lvl 19.6  10.0 - 20.0 ug/mL  COMPREHENSIVE METABOLIC PANEL     Status: Abnormal  Collection Time    11/16/13  6:44 AM      Result Value Ref Range   Sodium 142  137 - 147 mEq/Owen   Potassium 4.6  3.7 - 5.3 mEq/Owen   Chloride 103  96 - 112 mEq/Owen   CO2 27  19 - 32 mEq/Owen   Glucose, Bld 134 (*) 70 - 99 mg/dL   BUN 58 (*) 6 - 23 mg/dL   Creatinine, Ser 2.34 (*) 0.50 - 1.35 mg/dL   Calcium 9.8  8.4 - 10.5 mg/dL   Total Protein 7.0  6.0 - 8.3 g/dL   Albumin 2.8 (*) 3.5 - 5.2 g/dL   AST 23  0 - 37 U/Owen   ALT 10  0 - 53 U/Owen   Alkaline Phosphatase 26 (*) 39 - 117 U/Owen   Total Bilirubin 0.4  0.3 - 1.2 mg/dL   GFR calc non Af Amer 25 (*) >90 mL/min   GFR calc Af Amer 29 (*) >90 mL/min   Comment: (NOTE)     The eGFR has been calculated using the CKD EPI equation.     This calculation has not been validated in all clinical situations.     eGFR's persistently <90 mL/min signify possible Chronic Kidney     Disease.   Anion gap 12  5 - 15  GLUCOSE, CAPILLARY     Status: Abnormal   Collection Time    11/16/13  7:10 AM      Result Value Ref Range   Glucose-Capillary 115 (*) 70 - 99 mg/dL   Comment 1 Notify RN     Comment 2 Documented in Chart    GLUCOSE, CAPILLARY     Status: Abnormal   Collection Time    11/16/13 11:42 AM      Result  Value Ref Range   Glucose-Capillary 203 (*) 70 - 99 mg/dL   Comment 1 Documented in Chart     Comment 2 Notify RN    GLUCOSE, CAPILLARY     Status: Abnormal   Collection Time    11/16/13  5:05 PM      Result Value Ref Range   Glucose-Capillary 170 (*) 70 - 99 mg/dL   Comment 1 Notify RN     Comment 2 Documented in Chart    GLUCOSE, CAPILLARY     Status: Abnormal   Collection Time    11/16/13 10:12 PM      Result Value Ref Range   Glucose-Capillary 161 (*) 70 - 99 mg/dL   Comment 1 Documented in Chart     Comment 2 Notify RN    GLUCOSE, CAPILLARY     Status: Abnormal   Collection Time    11/17/13  7:50 AM      Result Value Ref Range   Glucose-Capillary 151 (*) 70 - 99 mg/dL   Comment 1 Notify RN      ABGS No results found for this basename: PHART, PCO2, PO2ART, TCO2, HCO3,  in the last 72 hours CULTURES No results found for this or any previous visit (from the past 240 hour(s)). Studies/Results: No results found.  Medications:  Prior to Admission:  Prescriptions prior to admission  Medication Sig Dispense Refill  . allopurinol (ZYLOPRIM) 300 MG tablet Take 300 mg by mouth every morning.       Marland Kitchen ALPRAZolam (XANAX) 0.5 MG tablet Take 0.5 mg by mouth 3 (three) times daily as needed. For anxiety      . amLODipine (NORVASC) 5 MG tablet Take  5 mg by mouth every morning.       Marland Kitchen atorvastatin (LIPITOR) 80 MG tablet Take 40 mg by mouth at bedtime.       . carvedilol (COREG) 25 MG tablet Take 25 mg by mouth 2 (two) times daily with a meal.      . dipyridamole-aspirin (AGGRENOX) 25-200 MG per 12 hr capsule Take 1 capsule by mouth 2 (two) times daily.      . fenofibrate 160 MG tablet Take 160 mg by mouth every morning.       . furosemide (LASIX) 40 MG tablet Take 1 tablet (40 mg total) by mouth daily. May take twice daily if needed  30 tablet  12  . glimepiride (AMARYL) 4 MG tablet Take 4 mg by mouth 2 (two) times daily with a meal.       . hydrALAZINE (APRESOLINE) 50 MG tablet Take  50 mg by mouth 4 (four) times daily.      Marland Kitchen HYDROcodone-acetaminophen (NORCO/VICODIN) 5-325 MG per tablet Take 1 tablet by mouth every 6 (six) hours as needed for pain.      Marland Kitchen insulin glargine (LANTUS) 100 UNIT/ML injection Inject 25 Units into the skin at bedtime.      . isosorbide mononitrate (IMDUR) 30 MG 24 hr tablet Take 1 tablet (30 mg total) by mouth daily.  30 tablet  12  . levETIRAcetam (KEPPRA) 500 MG tablet Take 500 mg by mouth 2 (two) times daily.      Marland Kitchen levothyroxine (SYNTHROID, LEVOTHROID) 200 MCG tablet Takes 240mg tablet and 25 mcg tablet for total dose of 2261m      . levothyroxine (SYNTHROID, LEVOTHROID) 25 MCG tablet Takes 20069mtablet and 25 mcg tablet for total dose of 225m76m    . losartan (COZAAR) 25 MG tablet Take 25 mg by mouth daily.      . metolazone (ZAROXOLYN) 5 MG tablet Take 5 mg by mouth 2 (two) times daily as needed. Swelling      . phenytoin (DILANTIN) 100 MG ER capsule Take 100-300 mg by mouth 2 (two) times daily. Patient take 100 in the morning and 300 at night      . potassium chloride (K-DUR) 10 MEQ tablet Take 20 mEq by mouth 3 (three) times daily.       . spMarland Kitchenronolactone (ALDACTONE) 12.5 mg TABS Take 0.5 tablets (12.5 mg total) by mouth daily.  30 tablet  12  . Tamsulosin HCl (FLOMAX) 0.4 MG CAPS Take 0.4 mg by mouth 2 (two) times daily.      . amMarland Kitchenodarone (PACERONE) 200 MG tablet Take 1 tablet (200 mg total) by mouth daily.  60 tablet  6  . ciprofloxacin (CILOXAN) 0.3 % ophthalmic solution Place 1 drop into both eyes 2 (two) times daily. *ONLY USES AS NEEDED FOR FLARE*      . nitroGLYCERIN (NITROSTAT) 0.4 MG SL tablet Place 0.4 mg under the tongue every 5 (five) minutes x 3 doses as needed for chest pain.      . promethazine (PHENERGAN) 25 MG tablet Take 1 tablet by mouth daily as needed for nausea.        Scheduled: . allopurinol  300 mg Oral q morning - 10a  . amiodarone  200 mg Oral Daily  . amLODipine  5 mg Oral q morning - 10a  . atorvastatin  40  mg Oral QHS  . carvedilol  25 mg Oral BID WC  . dipyridamole-aspirin  1 capsule Oral BID  . fenofibrate  160 mg Oral q morning - 10a  . heparin  5,000 Units Subcutaneous 3 times per day  . hydrALAZINE  50 mg Oral QID  . insulin aspart  0-9 Units Subcutaneous TID WC  . isosorbide mononitrate  15 mg Oral Daily  . levETIRAcetam  500 mg Oral BID  . levothyroxine  225 mcg Oral QAC breakfast  . phenytoin  100 mg Oral Daily  . phenytoin  300 mg Oral QHS  . potassium chloride  20 mEq Oral TID  . sodium chloride  3 mL Intravenous Q12H  . tamsulosin  0.4 mg Oral BID  . torsemide  20 mg Oral BID   Continuous:  VXB:LTJQZE chloride, acetaminophen, albuterol, ALPRAZolam, dextrose, glucose-Vitamin C, haloperidol lactate, LORazepam, nitroGLYCERIN, ondansetron (ZOFRAN) IV, sodium chloride  Assesment: He was admitted with acute on chronic combined systolic and diastolic heart failure. He is much improved. He was very encephalopathic. He has chronic renal failure and his renal function a little worse yesterday. His basic metabolic profile this morning is pending. Principal Problem:   Acute on chronic combined systolic and diastolic CHF, NYHA class 3 Active Problems:   HYPOTHYROIDISM   HYPERTENSION   Wide-complex tachycardia   Arteriosclerotic cardiovascular disease (ASCVD)   Diabetes mellitus, type 2   Dementia with behavioral disturbance   Chronic hypoxemic respiratory failure   Encephalopathy, metabolic   Acute on chronic combined systolic and diastolic congestive heart failure    Plan: Continue with current treatments. Check on the renal function. Probably home in the morning    LOS: 6 days   Donald Owen 11/17/2013, 8:38 AM

## 2013-11-18 LAB — BASIC METABOLIC PANEL
ANION GAP: 10 (ref 5–15)
BUN: 53 mg/dL — ABNORMAL HIGH (ref 6–23)
CO2: 27 mEq/L (ref 19–32)
CREATININE: 1.92 mg/dL — AB (ref 0.50–1.35)
Calcium: 10 mg/dL (ref 8.4–10.5)
Chloride: 104 mEq/L (ref 96–112)
GFR, EST AFRICAN AMERICAN: 37 mL/min — AB (ref >90)
GFR, EST NON AFRICAN AMERICAN: 32 mL/min — AB (ref >90)
Glucose, Bld: 135 mg/dL — ABNORMAL HIGH (ref 70–99)
Potassium: 4.8 mEq/L (ref 3.7–5.3)
Sodium: 141 mEq/L (ref 137–147)

## 2013-11-18 LAB — GLUCOSE, CAPILLARY
GLUCOSE-CAPILLARY: 162 mg/dL — AB (ref 70–99)
GLUCOSE-CAPILLARY: 173 mg/dL — AB (ref 70–99)
Glucose-Capillary: 136 mg/dL — ABNORMAL HIGH (ref 70–99)

## 2013-11-18 MED ORDER — TORSEMIDE 20 MG PO TABS: 20.0000 mg | ORAL_TABLET | Freq: Two times a day (BID) | ORAL | Status: AC

## 2013-11-18 MED ORDER — AMIODARONE HCL 200 MG PO TABS: 200.0000 mg | ORAL_TABLET | Freq: Every day | ORAL | Status: DC

## 2013-11-18 MED ORDER — CEPHALEXIN 500 MG PO CAPS: 500.0000 mg | ORAL_CAPSULE | Freq: Three times a day (TID) | ORAL | Status: DC

## 2013-11-18 MED ORDER — PHENYTOIN SODIUM EXTENDED 100 MG PO CAPS: 300.0000 mg | ORAL_CAPSULE | Freq: Every day | ORAL | Status: DC

## 2013-11-18 NOTE — Discharge Summary (Signed)
Physician Discharge Summary  Patient ID: Donald Owen MRN: [id number] DOB/AGE: Jul 05, 1933 80 y.o. Primary Care Physician:Hykeem Ojeda L, MD Admit date: 11/11/2013 Discharge date: 11/18/2013    Discharge Diagnoses:   Principal Problem:   Acute on chronic combined systolic and diastolic CHF, NYHA class 3 Active Problems:   HYPOTHYROIDISM   HYPERTENSION   Wide-complex tachycardia   Arteriosclerotic cardiovascular disease (ASCVD)   Diabetes mellitus, type 2   Dementia with behavioral disturbance   Chronic hypoxemic respiratory failure   Encephalopathy, metabolic   Acute on chronic combined systolic and diastolic congestive heart failure  COPD Seizure disorder History of stroke with residual defects Acute bronchitis    Medication List    STOP taking these medications       furosemide 40 MG tablet  Commonly known as:  LASIX      TAKE these medications       AGGRENOX 200-25 MG per 12 hr capsule  Generic drug:  dipyridamole-aspirin  Take 1 capsule by mouth 2 (two) times daily.     allopurinol 300 MG tablet  Commonly known as:  ZYLOPRIM  Take 300 mg by mouth every morning.     ALPRAZolam 0.5 MG tablet  Commonly known as:  XANAX  Take 0.5 mg by mouth 3 (three) times daily as needed. For anxiety     amiodarone 200 MG tablet  Commonly known as:  PACERONE  Take 1 tablet (200 mg total) by mouth daily.     amiodarone 200 MG tablet  Commonly known as:  PACERONE  Take 1 tablet (200 mg total) by mouth daily.     amLODipine 5 MG tablet  Commonly known as:  NORVASC  Take 5 mg by mouth every morning.     atorvastatin 80 MG tablet  Commonly known as:  LIPITOR  Take 40 mg by mouth at bedtime.     carvedilol 25 MG tablet  Commonly known as:  COREG  Take 25 mg by mouth 2 (two) times daily with a meal.     cephALEXin 500 MG capsule  Commonly known as:  KEFLEX  Take 1 capsule (500 mg total) by mouth 3 (three) times daily.     ciprofloxacin 0.3 % ophthalmic solution   Commonly known as:  CILOXAN  Place 1 drop into both eyes 2 (two) times daily. *ONLY USES AS NEEDED FOR FLARE*     fenofibrate 160 MG tablet  Take 160 mg by mouth every morning.     glimepiride 4 MG tablet  Commonly known as:  AMARYL  Take 4 mg by mouth 2 (two) times daily with a meal.     hydrALAZINE 50 MG tablet  Commonly known as:  APRESOLINE  Take 50 mg by mouth 4 (four) times daily.     HYDROcodone-acetaminophen 5-325 MG per tablet  Commonly known as:  NORCO/VICODIN  Take 1 tablet by mouth every 6 (six) hours as needed for pain.     insulin glargine 100 UNIT/ML injection  Commonly known as:  LANTUS  Inject 25 Units into the skin at bedtime.     isosorbide mononitrate 30 MG 24 hr tablet  Commonly known as:  IMDUR  Take 1 tablet (30 mg total) by mouth daily.     levETIRAcetam 500 MG tablet  Commonly known as:  KEPPRA  Take 500 mg by mouth 2 (two) times daily.     levothyroxine 200 MCG tablet  Commonly known as:  SYNTHROID, LEVOTHROID  Takes tablet and 25 mcg tablet for  total dose of 225mcg     levothyroxine 25 MCG tablet  Commonly known as:  SYNTHROID, LEVOTHROID  Takes 200mcg tablet and 25 mcg tablet for total dose of 225mcg     losartan 25 MG tablet  Commonly known as:  COZAAR  Take 25 mg by mouth daily.     metolazone 5 MG tablet  Commonly known as:  ZAROXOLYN  Take 5 mg by mouth 2 (two) times daily as needed. Swelling     nitroGLYCERIN 0.4 MG SL tablet  Commonly known as:  NITROSTAT  Place 0.4 mg under the tongue every 5 (five) minutes x 3 doses as needed for chest pain.     phenytoin 100 MG ER capsule  Commonly known as:  DILANTIN  Take 3 capsules (300 mg total) by mouth at bedtime. Patient take 100 in the morning and 300 at night     potassium chloride 10 MEQ tablet  Commonly known as:  K-DUR  Take 20 mEq by mouth 3 (three) times daily.     promethazine 25 MG tablet  Commonly known as:  PHENERGAN  Take 1 tablet by mouth daily as needed for  nausea.     spironolactone 12.5 mg Tabs tablet  Commonly known as:  ALDACTONE  Take 0.5 tablets (12.5 mg total) by mouth daily.     tamsulosin 0.4 MG Caps capsule  Commonly known as:  FLOMAX  Take 0.4 mg by mouth 2 (two) times daily.     torsemide 20 MG tablet  Commonly known as:  DEMADEX  Take 1 tablet (20 mg total) by mouth 2 (two) times daily.        Discharged Condition: Improved    Consults: Cardiology  Significant Diagnostic Studies: Ct Head Wo Contrast  11/11/2013   CLINICAL DATA:  Altered mental status  EXAM: CT HEAD WITHOUT CONTRAST  TECHNIQUE: Contiguous axial images were obtained from the base of the skull through the vertex without intravenous contrast.  COMPARISON:  None.  FINDINGS: Large old infarct with encephalomalacia with in the left frontal lobe. No acute infarction. No hemorrhage. No mass effect or midline shift. No hydrocephalus.  No acute calvarial abnormality.  Visualized paranasal sinuses and mastoids clear. Orbital soft tissues unremarkable.  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  No acute intracranial abnormality.   Electronically Signed   By: Charlett NoseKevin  Dover M.D.   On: 11/11/2013 15:07   Dg Chest Port 1 View  11/17/2013   CLINICAL DATA:  Thick sputum for 1 day, history CHF, hypertension, diabetes, stroke, ischemic cardiomyopathy  EXAM: PORTABLE CHEST - 1 VIEW  COMPARISON:  Portable exam 1624 hr compared to 11/11/2013  FINDINGS: Light technique limits exam.  Enlargement of cardiac silhouette post CABG.  Pulmonary vascular congestion.  Mild interstitial infiltrates compatible pulmonary edema and CHF.  Questionable small LEFT pleural effusion.  No gross pneumothorax or osseous findings.  IMPRESSION: Mild CHF.   Electronically Signed   By: Ulyses SouthwardMark  Boles M.D.   On: 11/17/2013 16:41   Dg Chest Port 1 View  11/11/2013   CLINICAL DATA:  Shortness of breath and chest pain  EXAM: PORTABLE CHEST - 1 VIEW  COMPARISON:  August 22, 2013  FINDINGS: There  is interstitial edema throughout the lungs bilaterally. There is cardiomegaly with pulmonary venous hypertension. There is no airspace consolidation. No adenopathy. Patient is status post median sternotomy.  IMPRESSION: Congestive heart failure.  No airspace consolidation.   Electronically Signed   By: Bretta BangWilliam  Woodruff M.D.  On: 11/11/2013 14:05    Lab Results: Basic Metabolic Panel:  Recent Labs  45/40/98 0837 11/18/13 0557  NA 140 141  K 4.7 4.8  CL 103 104  CO2 27 27  GLUCOSE 165* 135*  BUN 57* 53*  CREATININE 2.10* 1.92*  CALCIUM 9.9 10.0   Liver Function Tests:  Recent Labs  11/16/13 0644  AST 23  ALT 10  ALKPHOS 26*  BILITOT 0.4  PROT 7.0  ALBUMIN 2.8*     CBC:  Recent Labs  11/16/13 0644  WBC 6.2  NEUTROABS 4.4  HGB 11.8*  HCT 36.3*  MCV 94.5  PLT 178    No results found for this or any previous visit (from the past 240 hour(s)).   Hospital Course: This is a 80 year old who has a long known history of COPD and congestive heart failure. He had apparently been somewhat noncompliant with his diet and some of his medications and he developed increasing problems with fluid. When he came to the emergency department he was noted to have pulmonary edema. He was started on IV diuresis. He developed acute metabolic encephalopathy related to his acute illness. This resolved after about 48 hours. He continued on IV diuresis and then was switched to by mouth. Cardiology consultation was obtained. Recommendations were followed. He had some cough and congestion before discharge and had another chest x-ray which was much improved and did not show pneumonia.  Discharge Exam: Blood pressure 132/39, pulse 66, temperature 98.4 F (36.9 C), temperature source Oral, resp. rate 20, height 5\' 8"  (1.727 m), weight 88 kg (194 lb 0.1 oz), SpO2 99.00%. He is awake and alert. His chest is clear. His heart is regular. He is much less edema of his leg  Disposition: Home with home  health services      Discharge Instructions   Discharge patient    Complete by:  As directed      Face-to-face encounter (required for Medicare/Medicaid patients)    Complete by:  As directed   I Iseah Plouff L certify that this patient is under my care and that I, or a nurse practitioner or physician's assistant working with me, had a face-to-face encounter that meets the physician face-to-face encounter requirements with this patient on 11/18/2013. The encounter with the patient was in whole, or in part for the following medical condition(s) which is the primary reason for home health care (List medical condition): chf  The encounter with the patient was in whole, or in part, for the following medical condition, which is the primary reason for home health care:  chf  I certify that, based on my findings, the following services are medically necessary home health services:   Nursing Physical therapy    My clinical findings support the need for the above services:  Shortness of breath with activity  Further, I certify that my clinical findings support that this patient is homebound due to:  Shortness of Breath with activity  Reason for Medically Necessary Home Health Services:  Skilled Nursing- Change/Decline in Patient Status     Home Health    Complete by:  As directed   To provide the following care/treatments:   PT RN               Signed: Carlton Sweaney L   11/18/2013, 8:54 AM

## 2013-11-18 NOTE — Clinical Social Work Note (Signed)
CSW arranged transport via [facility] at family request. CSW called pt's daughter Lupita LeashDonna, but no answer or voicemail. Pt's daughter Britta MccreedyBarbara in room and provided address and states she will follow EMS and Lupita LeashDonna is at home awaiting their arrival. No other needs reported.  Derenda FennelKara Perris [place], [place] [phone]

## 2013-11-18 NOTE — Progress Notes (Signed)
Patient with orders to be discharge home. Discharge instructions given to daughter, verbalized understanding. Patient stable. Patient transported via EMS.

## 2013-11-18 NOTE — Progress Notes (Signed)
He is significantly improved. His family said he was coughing up some yellowish sputum yesterday so I had him get a chest x-ray which was much clearer than before. I think he is ready for discharge. Please see discharge summary for details

## 2013-11-18 NOTE — Progress Notes (Signed)
Patient ID: Donald Owen, male   DOB: 08-31-1933, 80 y.o.   MRN: [id number]  Patient negative 2 liters yesterday on oral torsemide bid, will decrease to once daily. Total negative 11 liters since admission. He is on his baseline home O2 requirement and appears near euvolemic. No new recs from cardiac standpoint, he may follow up with NP Lawrence in 1-2 weeks. Will sign off of inpatient care.   Dominga FerryJ Jomarion Mish MD

## 2013-12-04 ENCOUNTER — Encounter: Payer: Medicare Other | Admitting: Adult Health

## 2013-12-06 ENCOUNTER — Observation Stay (HOSPITAL_COMMUNITY): Payer: Medicare Other

## 2013-12-06 ENCOUNTER — Inpatient Hospital Stay (HOSPITAL_COMMUNITY)
Admission: EM | Admit: 2013-12-06 | Discharge: 2013-12-10 | DRG: 690 | Disposition: A | Discharge status: Home Health | Payer: Medicare Other | Attending: Pulmonary Disease | Admitting: Pulmonary Disease

## 2013-12-06 ENCOUNTER — Encounter (HOSPITAL_COMMUNITY): Payer: Self-pay | Admitting: Emergency Medicine

## 2013-12-06 ENCOUNTER — Emergency Department (HOSPITAL_COMMUNITY): Payer: Medicare Other

## 2013-12-06 DIAGNOSIS — N390 Urinary tract infection, site not specified: Secondary | ICD-10-CM | POA: Diagnosis not present

## 2013-12-06 DIAGNOSIS — E039 Hypothyroidism, unspecified: Secondary | ICD-10-CM | POA: Diagnosis present

## 2013-12-06 DIAGNOSIS — N139 Obstructive and reflux uropathy, unspecified: Secondary | ICD-10-CM | POA: Diagnosis present

## 2013-12-06 DIAGNOSIS — N189 Chronic kidney disease, unspecified: Secondary | ICD-10-CM

## 2013-12-06 DIAGNOSIS — J4489 Other specified chronic obstructive pulmonary disease: Secondary | ICD-10-CM | POA: Diagnosis present

## 2013-12-06 DIAGNOSIS — I509 Heart failure, unspecified: Secondary | ICD-10-CM | POA: Diagnosis present

## 2013-12-06 DIAGNOSIS — F0391 Unspecified dementia with behavioral disturbance: Secondary | ICD-10-CM | POA: Diagnosis present

## 2013-12-06 DIAGNOSIS — N401 Enlarged prostate with lower urinary tract symptoms: Secondary | ICD-10-CM | POA: Diagnosis present

## 2013-12-06 DIAGNOSIS — Z8673 Personal history of transient ischemic attack (TIA), and cerebral infarction without residual deficits: Secondary | ICD-10-CM

## 2013-12-06 DIAGNOSIS — Z9981 Dependence on supplemental oxygen: Secondary | ICD-10-CM

## 2013-12-06 DIAGNOSIS — Z8249 Family history of ischemic heart disease and other diseases of the circulatory system: Secondary | ICD-10-CM

## 2013-12-06 DIAGNOSIS — I2510 Atherosclerotic heart disease of native coronary artery without angina pectoris: Secondary | ICD-10-CM | POA: Diagnosis present

## 2013-12-06 DIAGNOSIS — I252 Old myocardial infarction: Secondary | ICD-10-CM

## 2013-12-06 DIAGNOSIS — Z79899 Other long term (current) drug therapy: Secondary | ICD-10-CM

## 2013-12-06 DIAGNOSIS — J9610 Chronic respiratory failure, unspecified whether with hypoxia or hypercapnia: Secondary | ICD-10-CM | POA: Diagnosis present

## 2013-12-06 DIAGNOSIS — I5022 Chronic systolic (congestive) heart failure: Secondary | ICD-10-CM

## 2013-12-06 DIAGNOSIS — I5042 Chronic combined systolic (congestive) and diastolic (congestive) heart failure: Secondary | ICD-10-CM | POA: Diagnosis present

## 2013-12-06 DIAGNOSIS — F03918 Unspecified dementia, unspecified severity, with other behavioral disturbance: Secondary | ICD-10-CM | POA: Diagnosis present

## 2013-12-06 DIAGNOSIS — K5900 Constipation, unspecified: Secondary | ICD-10-CM | POA: Diagnosis present

## 2013-12-06 DIAGNOSIS — R338 Other retention of urine: Secondary | ICD-10-CM

## 2013-12-06 DIAGNOSIS — R339 Retention of urine, unspecified: Secondary | ICD-10-CM | POA: Diagnosis present

## 2013-12-06 DIAGNOSIS — Z951 Presence of aortocoronary bypass graft: Secondary | ICD-10-CM

## 2013-12-06 DIAGNOSIS — I959 Hypotension, unspecified: Secondary | ICD-10-CM | POA: Diagnosis not present

## 2013-12-06 DIAGNOSIS — E860 Dehydration: Secondary | ICD-10-CM | POA: Diagnosis present

## 2013-12-06 DIAGNOSIS — M109 Gout, unspecified: Secondary | ICD-10-CM | POA: Diagnosis present

## 2013-12-06 DIAGNOSIS — Z66 Do not resuscitate: Secondary | ICD-10-CM | POA: Diagnosis present

## 2013-12-06 DIAGNOSIS — I10 Essential (primary) hypertension: Secondary | ICD-10-CM | POA: Diagnosis present

## 2013-12-06 DIAGNOSIS — R404 Transient alteration of awareness: Secondary | ICD-10-CM | POA: Diagnosis not present

## 2013-12-06 DIAGNOSIS — E785 Hyperlipidemia, unspecified: Secondary | ICD-10-CM | POA: Diagnosis present

## 2013-12-06 DIAGNOSIS — G40909 Epilepsy, unspecified, not intractable, without status epilepticus: Secondary | ICD-10-CM | POA: Diagnosis present

## 2013-12-06 DIAGNOSIS — R4689 Other symptoms and signs involving appearance and behavior: Secondary | ICD-10-CM

## 2013-12-06 DIAGNOSIS — I2589 Other forms of chronic ischemic heart disease: Secondary | ICD-10-CM | POA: Diagnosis present

## 2013-12-06 DIAGNOSIS — Z87891 Personal history of nicotine dependence: Secondary | ICD-10-CM

## 2013-12-06 DIAGNOSIS — J9611 Chronic respiratory failure with hypoxia: Secondary | ICD-10-CM | POA: Diagnosis present

## 2013-12-06 DIAGNOSIS — I129 Hypertensive chronic kidney disease with stage 1 through stage 4 chronic kidney disease, or unspecified chronic kidney disease: Secondary | ICD-10-CM | POA: Diagnosis present

## 2013-12-06 DIAGNOSIS — N138 Other obstructive and reflux uropathy: Secondary | ICD-10-CM | POA: Diagnosis present

## 2013-12-06 DIAGNOSIS — Z794 Long term (current) use of insulin: Secondary | ICD-10-CM

## 2013-12-06 DIAGNOSIS — Z792 Long term (current) use of antibiotics: Secondary | ICD-10-CM

## 2013-12-06 DIAGNOSIS — E119 Type 2 diabetes mellitus without complications: Secondary | ICD-10-CM | POA: Diagnosis present

## 2013-12-06 DIAGNOSIS — J449 Chronic obstructive pulmonary disease, unspecified: Secondary | ICD-10-CM | POA: Diagnosis present

## 2013-12-06 DIAGNOSIS — B952 Enterococcus as the cause of diseases classified elsewhere: Secondary | ICD-10-CM | POA: Diagnosis present

## 2013-12-06 DIAGNOSIS — F603 Borderline personality disorder: Secondary | ICD-10-CM | POA: Diagnosis present

## 2013-12-06 DIAGNOSIS — R410 Disorientation, unspecified: Secondary | ICD-10-CM | POA: Diagnosis present

## 2013-12-06 LAB — GLUCOSE, CAPILLARY
GLUCOSE-CAPILLARY: 174 mg/dL — AB (ref 70–99)
Glucose-Capillary: 203 mg/dL — ABNORMAL HIGH (ref 70–99)
Glucose-Capillary: 180 mg/dL — ABNORMAL HIGH (ref 70–99)
Glucose-Capillary: 142 mg/dL — ABNORMAL HIGH (ref 70–99)

## 2013-12-06 LAB — CBC WITH DIFFERENTIAL/PLATELET
BASOS ABS: 0 10*3/uL (ref 0.0–0.1)
BASOS PCT: 0 % (ref 0–1)
EOS PCT: 1 % (ref 0–5)
Eosinophils Absolute: 0.1 10*3/uL (ref 0.0–0.7)
HCT: 37.9 % — ABNORMAL LOW (ref 39.0–52.0)
Hemoglobin: 13 g/dL (ref 13.0–17.0)
Lymphocytes Relative: 12 % (ref 12–46)
Lymphs Abs: 0.8 10*3/uL (ref 0.7–4.0)
MCH: 32.3 pg (ref 26.0–34.0)
MCHC: 34.3 g/dL (ref 30.0–36.0)
MCV: 94.3 fL (ref 78.0–100.0)
MONO ABS: 0.7 10*3/uL (ref 0.1–1.0)
Monocytes Relative: 10 % (ref 3–12)
Neutro Abs: 5.4 10*3/uL (ref 1.7–7.7)
Neutrophils Relative %: 77 % (ref 43–77)
Platelets: 259 10*3/uL (ref 150–400)
RBC: 4.02 MIL/uL — ABNORMAL LOW (ref 4.22–5.81)
RDW: 15.4 % (ref 11.5–15.5)
WBC: 7 10*3/uL (ref 4.0–10.5)

## 2013-12-06 LAB — BASIC METABOLIC PANEL
Anion gap: 17 — ABNORMAL HIGH (ref 5–15)
BUN: 66 mg/dL — ABNORMAL HIGH (ref 6–23)
CALCIUM: 10.5 mg/dL (ref 8.4–10.5)
CO2: 27 meq/L (ref 19–32)
Chloride: 93 mEq/L — ABNORMAL LOW (ref 96–112)
Creatinine, Ser: 2.08 mg/dL — ABNORMAL HIGH (ref 0.50–1.35)
GFR calc non Af Amer: 29 mL/min — ABNORMAL LOW (ref >90)
GFR, EST AFRICAN AMERICAN: 33 mL/min — AB (ref >90)
Glucose, Bld: 180 mg/dL — ABNORMAL HIGH (ref 70–99)
Potassium: 3.4 mEq/L — ABNORMAL LOW (ref 3.7–5.3)
Sodium: 137 mEq/L (ref 137–147)

## 2013-12-06 LAB — URINALYSIS, ROUTINE W REFLEX MICROSCOPIC
BILIRUBIN URINE: NEGATIVE
Glucose, UA: NEGATIVE mg/dL
Hgb urine dipstick: NEGATIVE
KETONES UR: NEGATIVE mg/dL
Leukocytes, UA: NEGATIVE
NITRITE: NEGATIVE
PH: 6 (ref 5.0–8.0)
Protein, ur: NEGATIVE mg/dL
Specific Gravity, Urine: <1.005 — ABNORMAL LOW (ref 1.005–1.030)
Urobilinogen, UA: 0.2 mg/dL (ref 0.0–1.0)

## 2013-12-06 LAB — PHENYTOIN LEVEL, TOTAL: Phenytoin Lvl: 11.3 ug/mL (ref 10.0–20.0)

## 2013-12-06 LAB — CBG MONITORING, ED: Glucose-Capillary: 172 mg/dL — ABNORMAL HIGH (ref 70–99)

## 2013-12-06 MED ORDER — FENOFIBRATE 160 MG PO TABS
160.0000 mg | ORAL_TABLET | Freq: Every morning | ORAL | Status: DC
  Administered 2013-12-06 – 2013-12-09 (×3): 160 mg via ORAL
  Filled 2013-12-06 (×6): qty 1

## 2013-12-06 MED ORDER — ENOXAPARIN SODIUM 40 MG/0.4ML ~~LOC~~ SOLN
40.0000 mg | Freq: Every day | SUBCUTANEOUS | Status: DC
  Administered 2013-12-06: 40 mg via SUBCUTANEOUS
  Filled 2013-12-06: qty 0.4

## 2013-12-06 MED ORDER — SODIUM CHLORIDE 0.9 % IJ SOLN: 3.0000 mL | INTRAMUSCULAR | Status: DC | PRN

## 2013-12-06 MED ORDER — ALLOPURINOL 300 MG PO TABS
300.0000 mg | ORAL_TABLET | Freq: Every morning | ORAL | Status: DC
  Administered 2013-12-06 – 2013-12-09 (×3): 300 mg via ORAL
  Filled 2013-12-06 (×5): qty 1

## 2013-12-06 MED ORDER — SODIUM CHLORIDE 0.9 % IV BOLUS (SEPSIS): 250.0000 mL | Freq: Once | INTRAVENOUS | Status: DC

## 2013-12-06 MED ORDER — BISACODYL 10 MG RE SUPP
10.0000 mg | Freq: Once | RECTAL | Status: AC
Start: 2013-12-06 — End: 2013-12-07

## 2013-12-06 MED ORDER — SODIUM CHLORIDE 0.9 % IV BOLUS (SEPSIS)
500.0000 mL | Freq: Once | INTRAVENOUS | Status: AC
  Administered 2013-12-06: 500 mL via INTRAVENOUS

## 2013-12-06 MED ORDER — HYDRALAZINE HCL 25 MG PO TABS
50.0000 mg | ORAL_TABLET | Freq: Four times a day (QID) | ORAL | Status: DC
  Administered 2013-12-06 – 2013-12-09 (×5): 50 mg via ORAL
  Filled 2013-12-06 (×8): qty 2

## 2013-12-06 MED ORDER — PHENYTOIN SODIUM EXTENDED 100 MG PO CAPS
300.0000 mg | ORAL_CAPSULE | Freq: Every day | ORAL | Status: DC
  Administered 2013-12-06 – 2013-12-09 (×4): 300 mg via ORAL
  Filled 2013-12-06 (×5): qty 3

## 2013-12-06 MED ORDER — SPIRONOLACTONE 25 MG PO TABS
12.5000 mg | ORAL_TABLET | Freq: Every day | ORAL | Status: DC
  Administered 2013-12-06 – 2013-12-10 (×4): 12.5 mg via ORAL
  Filled 2013-12-06 (×5): qty 1

## 2013-12-06 MED ORDER — DOCUSATE SODIUM 100 MG PO CAPS
100.0000 mg | ORAL_CAPSULE | Freq: Two times a day (BID) | ORAL | Status: DC
  Administered 2013-12-06 – 2013-12-09 (×7): 100 mg via ORAL
  Filled 2013-12-06 (×9): qty 1

## 2013-12-06 MED ORDER — PHENYTOIN SODIUM EXTENDED 100 MG PO CAPS
100.0000 mg | ORAL_CAPSULE | Freq: Every morning | ORAL | Status: DC
  Administered 2013-12-06 – 2013-12-09 (×4): 100 mg via ORAL
  Filled 2013-12-06 (×5): qty 1

## 2013-12-06 MED ORDER — ATORVASTATIN CALCIUM 40 MG PO TABS
40.0000 mg | ORAL_TABLET | Freq: Every day | ORAL | Status: DC
  Administered 2013-12-06 – 2013-12-09 (×4): 40 mg via ORAL
  Filled 2013-12-06 (×4): qty 1

## 2013-12-06 MED ORDER — SODIUM CHLORIDE 0.9 % IJ SOLN
3.0000 mL | Freq: Two times a day (BID) | INTRAMUSCULAR | Status: DC
  Administered 2013-12-06: 3 mL via INTRAVENOUS

## 2013-12-06 MED ORDER — LORAZEPAM 2 MG/ML IJ SOLN
1.0000 mg | INTRAMUSCULAR | Status: DC | PRN
  Filled 2013-12-06: qty 1

## 2013-12-06 MED ORDER — LORAZEPAM 1 MG PO TABS
1.0000 mg | ORAL_TABLET | Freq: Once | ORAL | Status: DC
  Filled 2013-12-06: qty 1

## 2013-12-06 MED ORDER — LEVETIRACETAM 500 MG PO TABS
500.0000 mg | ORAL_TABLET | Freq: Two times a day (BID) | ORAL | Status: DC
  Administered 2013-12-06 – 2013-12-09 (×8): 500 mg via ORAL
  Filled 2013-12-06 (×8): qty 1
  Filled 2013-12-06: qty 2

## 2013-12-06 MED ORDER — ISOSORBIDE MONONITRATE ER 60 MG PO TB24
30.0000 mg | ORAL_TABLET | Freq: Every day | ORAL | Status: DC
  Administered 2013-12-06 – 2013-12-10 (×4): 30 mg via ORAL
  Filled 2013-12-06 (×6): qty 1

## 2013-12-06 MED ORDER — SENNA 8.6 MG PO TABS
1.0000 | ORAL_TABLET | Freq: Two times a day (BID) | ORAL | Status: DC
  Administered 2013-12-06 – 2013-12-09 (×7): 8.6 mg via ORAL
  Filled 2013-12-06 (×9): qty 1

## 2013-12-06 MED ORDER — LORAZEPAM 2 MG/ML IJ SOLN
1.0000 mg | Freq: Once | INTRAMUSCULAR | Status: AC
  Administered 2013-12-06: 1 mg via INTRAVENOUS
  Filled 2013-12-06: qty 1

## 2013-12-06 MED ORDER — ASPIRIN-DIPYRIDAMOLE ER 25-200 MG PO CP12
1.0000 | ORAL_CAPSULE | Freq: Two times a day (BID) | ORAL | Status: DC
  Administered 2013-12-06 – 2013-12-09 (×7): 1 via ORAL
  Filled 2013-12-06 (×11): qty 1

## 2013-12-06 MED ORDER — SODIUM CHLORIDE 0.9 % IV SOLN: 250.0000 mL | INTRAVENOUS | Status: DC | PRN

## 2013-12-06 MED ORDER — SODIUM CHLORIDE 0.9 % IV SOLN
250.0000 mL | INTRAVENOUS | Status: DC | PRN
  Administered 2013-12-06: 250 mL via INTRAVENOUS
  Administered 2013-12-07: 1000 mL via INTRAVENOUS

## 2013-12-06 MED ORDER — LEVOFLOXACIN IN D5W 500 MG/100ML IV SOLN
500.0000 mg | INTRAVENOUS | Status: DC
  Administered 2013-12-06: 500 mg via INTRAVENOUS
  Filled 2013-12-06 (×2): qty 100

## 2013-12-06 MED ORDER — INSULIN ASPART 100 UNIT/ML ~~LOC~~ SOLN
0.0000 [IU] | Freq: Three times a day (TID) | SUBCUTANEOUS | Status: DC
  Administered 2013-12-06: 2 [IU] via SUBCUTANEOUS
  Administered 2013-12-06: 3 [IU] via SUBCUTANEOUS
  Administered 2013-12-06: 2 [IU] via SUBCUTANEOUS
  Administered 2013-12-08: 1 [IU] via SUBCUTANEOUS
  Administered 2013-12-09: 19:00:00 via SUBCUTANEOUS

## 2013-12-06 MED ORDER — TAMSULOSIN HCL 0.4 MG PO CAPS
0.4000 mg | ORAL_CAPSULE | Freq: Two times a day (BID) | ORAL | Status: DC
  Administered 2013-12-06 – 2013-12-09 (×7): 0.4 mg via ORAL
  Filled 2013-12-06 (×9): qty 1

## 2013-12-06 MED ORDER — INSULIN GLARGINE 100 UNIT/ML ~~LOC~~ SOLN
25.0000 [IU] | Freq: Every day | SUBCUTANEOUS | Status: DC
  Administered 2013-12-06 – 2013-12-09 (×4): 25 [IU] via SUBCUTANEOUS
  Filled 2013-12-06 (×4): qty 0.25

## 2013-12-06 MED ORDER — AMLODIPINE BESYLATE 5 MG PO TABS
5.0000 mg | ORAL_TABLET | Freq: Every morning | ORAL | Status: DC
  Administered 2013-12-06 – 2013-12-09 (×2): 5 mg via ORAL
  Filled 2013-12-06 (×5): qty 1

## 2013-12-06 MED ORDER — LEVOTHYROXINE SODIUM 100 MCG PO TABS
200.0000 ug | ORAL_TABLET | Freq: Every day | ORAL | Status: DC
  Administered 2013-12-06 – 2013-12-10 (×4): 200 ug via ORAL
  Filled 2013-12-06 (×5): qty 2

## 2013-12-06 MED ORDER — AMIODARONE HCL 200 MG PO TABS
200.0000 mg | ORAL_TABLET | Freq: Every day | ORAL | Status: DC
  Administered 2013-12-06 – 2013-12-09 (×2): 200 mg via ORAL
  Filled 2013-12-06 (×5): qty 1

## 2013-12-06 MED ORDER — LOSARTAN POTASSIUM 50 MG PO TABS
25.0000 mg | ORAL_TABLET | Freq: Every day | ORAL | Status: DC
  Administered 2013-12-06 – 2013-12-10 (×3): 25 mg via ORAL
  Filled 2013-12-06 (×5): qty 1

## 2013-12-06 MED ORDER — ACETAMINOPHEN 325 MG PO TABS
650.0000 mg | ORAL_TABLET | Freq: Four times a day (QID) | ORAL | Status: DC | PRN
  Administered 2013-12-08 (×2): 650 mg via ORAL
  Filled 2013-12-06 (×5): qty 2

## 2013-12-06 MED ORDER — ACETAMINOPHEN 650 MG RE SUPP: 650.0000 mg | Freq: Four times a day (QID) | RECTAL | Status: DC | PRN

## 2013-12-06 MED ORDER — LEVOTHYROXINE SODIUM 25 MCG PO TABS
25.0000 ug | ORAL_TABLET | Freq: Every day | ORAL | Status: DC
  Administered 2013-12-06 – 2013-12-09 (×3): 25 ug via ORAL
  Filled 2013-12-06 (×5): qty 1

## 2013-12-06 MED ORDER — CARVEDILOL 12.5 MG PO TABS
25.0000 mg | ORAL_TABLET | Freq: Two times a day (BID) | ORAL | Status: DC
  Administered 2013-12-06 – 2013-12-10 (×6): 25 mg via ORAL
  Filled 2013-12-06 (×8): qty 2

## 2013-12-06 NOTE — ED Notes (Signed)
Patient from home. Daughter called EMS and stated patient was more altered than normal. States patient is hollering out more than normal and is complaining more than normal. States patient hollers and complains like this when he is not feeling well. States when patient last acted this way he was constipated; however, patient recently had large bowel movement. Patient repeatedly shouting and cursing. Unable to answer questions. Patient with history of dementia.

## 2013-12-06 NOTE — ED Notes (Signed)
Patient refused to take ativan tablet. Dr. Bebe ShaggyWickline notified.

## 2013-12-06 NOTE — Plan of Care (Signed)
Problem: Phase I Progression Outcomes Goal: OOB as tolerated unless otherwise ordered Outcome: Not Applicable Date Met:  40/98/11 Pt is bedridden.

## 2013-12-06 NOTE — H&P (Addendum)
PCP:   Fredirick Maudlin, MD   Chief Complaint:  Agitation  HPI:  80 year old male who  has a past medical history of Coronary atherosclerosis of native coronary artery (2000); Gout; Hypothyroidism; Seizures; Ventricular tachycardia; Diabetes mellitus, type 2; Cerebrovascular disease (2004); Essential hypertension, benign; Gastritis (2004); Hyperlipidemia; and Ischemic cardiomyopathy. As per family patient has been agitated over the past 2 days, with very poor by mouth intake. Patient had constipation, and they tried over-the-counter Colace which did help somewhat, but last night again patient complained of abdominal pain. Patient has history of dementia with behavioral disturbance, and has been yelling and agitated. No other symptoms as per patient's daughter, no nausea vomiting, no chest pain. Patient does use chronic home oxygen for chronic hypoxic respiratory failure. In the ED, he was found to be in acute urinary retention. Bladder scan showed 1000 cc of urine.  Allergies:  No Known Allergies    Past Medical History  Diagnosis Date  . Coronary atherosclerosis of native coronary artery 2000    CABG 2000 following acute MI - refused followup cardiac catheterization 2012  . Gout   . Hypothyroidism   . Seizures   . Ventricular tachycardia     On amiodarone  . Diabetes mellitus, type 2   . Cerebrovascular disease 2004    Left cerebral CVA followed by carotid endarterectomy in [place]  . Essential hypertension, benign   . Gastritis 2004    Gastric erosions  . Hyperlipidemia   . Ischemic cardiomyopathy     LVEF 45% 2013    Past Surgical History  Procedure Laterality Date  . Coronary artery bypass graft  04/1999    Dr. Barry Dienes  . Carotid endarterectomy    . Cholecystectomy      Prior to Admission medications   Medication Sig Start Date End Date Taking? Authorizing Provider  allopurinol (ZYLOPRIM) 300 MG tablet Take 300 mg by mouth every morning.     Historical Provider,  MD  ALPRAZolam Prudy Feeler) 0.5 MG tablet Take 0.5 mg by mouth 3 (three) times daily as needed. For anxiety    Historical Provider, MD  amiodarone (PACERONE) 200 MG tablet Take 1 tablet (200 mg total) by mouth daily. 02/15/12 08/19/13  Marinus Maw, MD  amiodarone (PACERONE) 200 MG tablet Take 1 tablet (200 mg total) by mouth daily. 11/18/13   Fredirick Maudlin, MD  amLODipine (NORVASC) 5 MG tablet Take 5 mg by mouth every morning.     Historical Provider, MD  atorvastatin (LIPITOR) 80 MG tablet Take 40 mg by mouth at bedtime.     Historical Provider, MD  carvedilol (COREG) 25 MG tablet Take 25 mg by mouth 2 (two) times daily with a meal.    Historical Provider, MD  cephALEXin (KEFLEX) 500 MG capsule Take 1 capsule (500 mg total) by mouth 3 (three) times daily. 11/18/13   Fredirick Maudlin, MD  ciprofloxacin (CILOXAN) 0.3 % ophthalmic solution Place 1 drop into both eyes 2 (two) times daily. *ONLY USES AS NEEDED FOR FLARE* 03/09/13   Historical Provider, MD  dipyridamole-aspirin (AGGRENOX) 25-200 MG per 12 hr capsule Take 1 capsule by mouth 2 (two) times daily.    Historical Provider, MD  fenofibrate 160 MG tablet Take 160 mg by mouth every morning.     Historical Provider, MD  glimepiride (AMARYL) 4 MG tablet Take 4 mg by mouth 2 (two) times daily with a meal.     Historical Provider, MD  hydrALAZINE (APRESOLINE) 50 MG tablet Take 50  mg by mouth 4 (four) times daily.    Historical Provider, MD  HYDROcodone-acetaminophen (NORCO/VICODIN) 5-325 MG per tablet Take 1 tablet by mouth every 6 (six) hours as needed for pain.    Historical Provider, MD  insulin glargine (LANTUS) 100 UNIT/ML injection Inject 25 Units into the skin at bedtime.    Historical Provider, MD  isosorbide mononitrate (IMDUR) 30 MG 24 hr tablet Take 1 tablet (30 mg total) by mouth daily. 08/25/13   Fredirick MaudlinEdward L Hawkins, MD  levETIRAcetam (KEPPRA) 500 MG tablet Take 500 mg by mouth 2 (two) times daily.    Historical Provider, MD  levothyroxine  (SYNTHROID, LEVOTHROID) 200 MCG tablet Takes 200mcg tablet and 25 mcg tablet for total dose of 225mcg    Historical Provider, MD  levothyroxine (SYNTHROID, LEVOTHROID) 25 MCG tablet Takes 200mcg tablet and 25 mcg tablet for total dose of 225mcg    Historical Provider, MD  losartan (COZAAR) 25 MG tablet Take 25 mg by mouth daily. 10/20/13   Historical Provider, MD  metolazone (ZAROXOLYN) 5 MG tablet Take 5 mg by mouth 2 (two) times daily as needed. Swelling    Historical Provider, MD  nitroGLYCERIN (NITROSTAT) 0.4 MG SL tablet Place 0.4 mg under the tongue every 5 (five) minutes x 3 doses as needed for chest pain.    Historical Provider, MD  phenytoin (DILANTIN) 100 MG ER capsule Take 3 capsules (300 mg total) by mouth at bedtime. Patient take 100 in the morning and 300 at night 11/18/13   Fredirick MaudlinEdward L Hawkins, MD  potassium chloride (K-DUR) 10 MEQ tablet Take 20 mEq by mouth 3 (three) times daily.     Historical Provider, MD  promethazine (PHENERGAN) 25 MG tablet Take 1 tablet by mouth daily as needed for nausea.  03/09/13   Historical Provider, MD  spironolactone (ALDACTONE) 12.5 mg TABS Take 0.5 tablets (12.5 mg total) by mouth daily. 07/17/12   Fredirick MaudlinEdward L Hawkins, MD  Tamsulosin HCl (FLOMAX) 0.4 MG CAPS Take 0.4 mg by mouth 2 (two) times daily.    Historical Provider, MD  torsemide (DEMADEX) 20 MG tablet Take 1 tablet (20 mg total) by mouth 2 (two) times daily. 11/18/13   Fredirick MaudlinEdward L Hawkins, MD    Social History:  reports that he quit smoking about 14 years ago. His smoking use included Cigarettes. He has a 60 pack-year smoking history. He has never used smokeless tobacco. He reports that he does not drink alcohol or use illicit drugs.  Family History  Problem Relation Age of Onset  . Heart disease Father        Review of Systems:  Unobtainable, due to patient's dementia   Physical Exam: Blood pressure 146/58, pulse 80, temperature 97.9 F (36.6 C), temperature source Oral, resp. rate 22, height  5\' 7"  (1.702 m), weight 81.647 kg (180 lb), SpO2 100.00%. Constitutional:   Patient is a demented male in no acute distress and cooperative with exam. Head: Normocephalic and atraumatic Mouth: Mucus membranes moist Eyes: PERRL, EOMI, conjunctivae normal Neck: Supple, No Thyromegaly Cardiovascular: RRR, S1 normal, S2 normal Pulmonary/Chest: CTAB, no wheezes, rales, or rhonchi Abdominal: Soft. Non-tender, non-distended, bowel sounds are normal, no masses, organomegaly, or guarding present.  Neurological: A&O x3, Strenght is normal and symmetric bilaterally, cranial nerve II-XII are grossly intact, no focal motor deficit, sensory intact to light touch bilaterally.  Extremities : No Cyanosis, Clubbing or Edema  Labs on Admission:  Basic Metabolic Panel:  Recent Labs Lab 12/06/13 0424  NA 137  K  3.4*  CL 93*  CO2 27  GLUCOSE 180*  BUN 66*  CREATININE 2.08*  CALCIUM 10.5   Liver Function Tests: No results found for this basename: AST, ALT, ALKPHOS, BILITOT, PROT, ALBUMIN,  in the last 168 hours No results found for this basename: LIPASE, AMYLASE,  in the last 168 hours No results found for this basename: AMMONIA,  in the last 168 hours CBC:  Recent Labs Lab 12/06/13 0424  WBC 7.0  NEUTROABS 5.4  HGB 13.0  HCT 37.9*  MCV 94.3  PLT 259   Cardiac Enzymes: No results found for this basename: CKTOTAL, CKMB, CKMBINDEX, TROPONINI,  in the last 168 hours  BNP (last 3 results)  Recent Labs  08/19/13 1632 11/11/13 1344  PROBNP 2134.0* 2652.0*   CBG:  Recent Labs Lab 12/06/13 0349  GLUCAP 172*    Radiological Exams on Admission: Dg Chest Portable 1 View  12/06/2013   CLINICAL DATA:  Altered mental status.  EXAM: PORTABLE CHEST - 1 VIEW  COMPARISON:  11/17/2013  FINDINGS: Postoperative changes in the mediastinum. Shallow inspiration. Mild cardiac enlargement and pulmonary vascular congestion. Slight interstitial edema. Improvement since previous study. No focal  consolidation. No pneumothorax. No blunting of costophrenic angles.  IMPRESSION: Cardiac enlargement with vascular congestion and mild interstitial edema, improving since previous study.   Electronically Signed   By: Burman Nieves M.D.   On: 12/06/2013 04:58    EKG: Independently reviewed. Indeterminate rhythm, artifact. QTC 578   Assessment/Plan Principal Problem:   Delirium Active Problems:   HYPOTHYROIDISM   HYPERTENSION   Diabetes mellitus, type 2   Ischemic heart disease   Dementia with behavioral disturbance   Chronic hypoxemic respiratory failure   Dehydration   Seizure disorder  Delirium At this time patient is exhibiting signs and symptoms of delirium superimposed on dementia, likely due to constipation and urinary retention.  Will give Dulcolax suppository, obtain abdominal x-ray 2 views. And also start senna tablets 1 tablet by mouth twice a day along with Colace 100 g by mouth twice a day Will Also Place, Foley and catheter for urinary retention.  Constipation Treatment as above  Urinary retention Likely due to constipation, insert Foley catheter and treat constipation as above  Chronic hypoxic respiratory failure Patient has chronic systolic CHF, last EF 45-50% Continue home oxygen. Will hold diuretics including metolazone and Lasix due to poor by mouth intake. Once patient starts taking by mouth the diuretics can be resumed.  Chronic systolic CHF Chest x-ray did not show significant pulmonary edema, will hold the diuretics at this time due to poor by mouth intake.  Seizure disorder Continue Keppra and phenytoin   DVT prophylaxis Lovenox  Code status: Patient is DO NOT RESUSCITATE  Family discussion: Admission, patients condition and plan of care including tests being ordered have been discussed with the patient's daughter at bedside who indicate understanding and agree with the plan and Code Status.   Time Spent on Admission: 65 minutes  Gus Littler  [facility] Pager: [phone] 12/06/2013, 6:34 AM  If 7PM-7AM, please contact night-coverage  [email or web address]  Password [email or web address]

## 2013-12-06 NOTE — Progress Notes (Signed)
This is an assumption of care note. He was admitted early this morning. He has altered mental status. He has baseline dementia and I'm concerned that he is just getting worse. This may be related to acute urinary retention. I'm going to go ahead and start him on antibiotics.

## 2013-12-06 NOTE — ED Provider Notes (Signed)
CSN: [id number]     Arrival date & time 12/06/13  4098 History   First MD Initiated Contact with Patient 12/06/13 [phone]     Chief Complaint  Patient presents with  . Altered Mental Status      Patient is a 80 y.o. male presenting with altered mental status. The history is provided by the EMS personnel.  Altered Mental Status Presenting symptoms: behavior changes   Severity:  Severe Most recent episode:  Today Timing:  Constant Progression:  Worsening Context: dementia   pt presents from home for worsening changes in mental status He has h/o dementia but apparently is more aggressive and yelling at home  Pt is unable to provide any details.  He is frequently yelling and cursing   Past Medical History  Diagnosis Date  . Coronary atherosclerosis of native coronary artery 2000    CABG 2000 following acute MI - refused followup cardiac catheterization 2012  . Gout   . Hypothyroidism   . Seizures   . Ventricular tachycardia     On amiodarone  . Diabetes mellitus, type 2   . Cerebrovascular disease 2004    Left cerebral CVA followed by carotid endarterectomy in [place]  . Essential hypertension, benign   . Gastritis 2004    Gastric erosions  . Hyperlipidemia   . Ischemic cardiomyopathy     LVEF 45% 2013   Past Surgical History  Procedure Laterality Date  . Coronary artery bypass graft  04/1999    Dr. Barry Dienes  . Carotid endarterectomy    . Cholecystectomy     Family History  Problem Relation Age of Onset  . Heart disease Father    History  Substance Use Topics  . Smoking status: Former Smoker -- 1.50 packs/day for 40 years    Types: Cigarettes    Quit date: 04/08/1999  . Smokeless tobacco: Never Used     Comment: quit 1999  . Alcohol Use: No    Review of Systems  Unable to perform ROS: Dementia      Allergies  Review of patient's allergies indicates no known allergies.  Home Medications   Prior to Admission medications   Medication Sig Start Date End  Date Taking? Authorizing Provider  allopurinol (ZYLOPRIM) 300 MG tablet Take 300 mg by mouth every morning.     Historical Provider, MD  ALPRAZolam Prudy Feeler) 0.5 MG tablet Take 0.5 mg by mouth 3 (three) times daily as needed. For anxiety    Historical Provider, MD  amiodarone (PACERONE) 200 MG tablet Take 1 tablet (200 mg total) by mouth daily. 02/15/12 08/19/13  Marinus Maw, MD  amiodarone (PACERONE) 200 MG tablet Take 1 tablet (200 mg total) by mouth daily. 11/18/13   Fredirick Maudlin, MD  amLODipine (NORVASC) 5 MG tablet Take 5 mg by mouth every morning.     Historical Provider, MD  atorvastatin (LIPITOR) 80 MG tablet Take 40 mg by mouth at bedtime.     Historical Provider, MD  carvedilol (COREG) 25 MG tablet Take 25 mg by mouth 2 (two) times daily with a meal.    Historical Provider, MD  cephALEXin (KEFLEX) 500 MG capsule Take 1 capsule (500 mg total) by mouth 3 (three) times daily. 11/18/13   Fredirick Maudlin, MD  ciprofloxacin (CILOXAN) 0.3 % ophthalmic solution Place 1 drop into both eyes 2 (two) times daily. *ONLY USES AS NEEDED FOR FLARE* 03/09/13   Historical Provider, MD  dipyridamole-aspirin (AGGRENOX) 25-200 MG per 12 hr capsule Take 1 capsule  by mouth 2 (two) times daily.    Historical Provider, MD  fenofibrate 160 MG tablet Take 160 mg by mouth every morning.     Historical Provider, MD  glimepiride (AMARYL) 4 MG tablet Take 4 mg by mouth 2 (two) times daily with a meal.     Historical Provider, MD  hydrALAZINE (APRESOLINE) 50 MG tablet Take 50 mg by mouth 4 (four) times daily.    Historical Provider, MD  HYDROcodone-acetaminophen (NORCO/VICODIN) 5-325 MG per tablet Take 1 tablet by mouth every 6 (six) hours as needed for pain.    Historical Provider, MD  insulin glargine (LANTUS) 100 UNIT/ML injection Inject 25 Units into the skin at bedtime.    Historical Provider, MD  isosorbide mononitrate (IMDUR) 30 MG 24 hr tablet Take 1 tablet (30 mg total) by mouth daily. 08/25/13   Fredirick MaudlinEdward L  Hawkins, MD  levETIRAcetam (KEPPRA) 500 MG tablet Take 500 mg by mouth 2 (two) times daily.    Historical Provider, MD  levothyroxine (SYNTHROID, LEVOTHROID) 200 MCG tablet Takes 200mcg tablet and 25 mcg tablet for total dose of 225mcg    Historical Provider, MD  levothyroxine (SYNTHROID, LEVOTHROID) 25 MCG tablet Takes 200mcg tablet and 25 mcg tablet for total dose of 225mcg    Historical Provider, MD  losartan (COZAAR) 25 MG tablet Take 25 mg by mouth daily. 10/20/13   Historical Provider, MD  metolazone (ZAROXOLYN) 5 MG tablet Take 5 mg by mouth 2 (two) times daily as needed. Swelling    Historical Provider, MD  nitroGLYCERIN (NITROSTAT) 0.4 MG SL tablet Place 0.4 mg under the tongue every 5 (five) minutes x 3 doses as needed for chest pain.    Historical Provider, MD  phenytoin (DILANTIN) 100 MG ER capsule Take 3 capsules (300 mg total) by mouth at bedtime. Patient take 100 in the morning and 300 at night 11/18/13   Fredirick MaudlinEdward L Hawkins, MD  potassium chloride (K-DUR) 10 MEQ tablet Take 20 mEq by mouth 3 (three) times daily.     Historical Provider, MD  promethazine (PHENERGAN) 25 MG tablet Take 1 tablet by mouth daily as needed for nausea.  03/09/13   Historical Provider, MD  spironolactone (ALDACTONE) 12.5 mg TABS Take 0.5 tablets (12.5 mg total) by mouth daily. 07/17/12   Fredirick MaudlinEdward L Hawkins, MD  Tamsulosin HCl (FLOMAX) 0.4 MG CAPS Take 0.4 mg by mouth 2 (two) times daily.    Historical Provider, MD  torsemide (DEMADEX) 20 MG tablet Take 1 tablet (20 mg total) by mouth 2 (two) times daily. 11/18/13   Fredirick MaudlinEdward L Hawkins, MD   BP 146/58  Pulse 80  Temp(Src) 97.9 F (36.6 C) (Oral)  Resp 22  Ht 5\' 7"  (1.702 m)  Wt 180 lb (81.647 kg)  BMI 28.19 kg/m2  SpO2 100% Physical Exam CONSTITUTIONAL: elderly, disheveld HEAD: Normocephalic/atraumatic EYES: EOMI ENMT: Mucous membranes dry NECK: supple no meningeal signs SPINE:no signs of trauma and no stepoffs CV: S1/S2 noted, no murmurs/rubs/gallops  noted LUNGS: Lungs are clear to auscultation bilaterally, no apparent distress ABDOMEN: soft, nontender, no rebound or guarding NEURO: Pt is awake/alert, moves all extremitiesx4.  He will follow some commands, but then will start to yell and curse while in room EXTREMITIES: pulses normal, full ROM, no deformities noted SKIN: warm, color normal PSYCH: no abnormalities of mood noted  ED Course  Procedures  5:56 AM Daughter at bedside She reports for past 24 hrs he has been more aggressive, yelling at home and refusing to take any  medications and minimal PO intake.   She reports he has had these episodes in the past He has h/o renal failure with some worsening when compared to prior D/w dr Sharl Ma Will admit for observation, medication adjustment. Labs Review Labs Reviewed  CBC WITH DIFFERENTIAL - Abnormal; Notable for the following:    RBC 4.02 (*)    HCT 37.9 (*)    All other components within normal limits  BASIC METABOLIC PANEL - Abnormal; Notable for the following:    Potassium 3.4 (*)    Chloride 93 (*)    Glucose, Bld 180 (*)    BUN 66 (*)    Creatinine, Ser 2.08 (*)    GFR calc non Af Amer 29 (*)    GFR calc Af Amer 33 (*)    Anion gap 17 (*)    All other components within normal limits  URINALYSIS, ROUTINE W REFLEX MICROSCOPIC - Abnormal; Notable for the following:    Specific Gravity, Urine <1.005 (*)    All other components within normal limits  CBG MONITORING, ED - Abnormal; Notable for the following:    Glucose-Capillary 172 (*)    All other components within normal limits  PHENYTOIN LEVEL, TOTAL    Imaging Review Dg Chest Portable 1 View  12/06/2013   CLINICAL DATA:  Altered mental status.  EXAM: PORTABLE CHEST - 1 VIEW  COMPARISON:  11/17/2013  FINDINGS: Postoperative changes in the mediastinum. Shallow inspiration. Mild cardiac enlargement and pulmonary vascular congestion. Slight interstitial edema. Improvement since previous study. No focal consolidation. No  pneumothorax. No blunting of costophrenic angles.  IMPRESSION: Cardiac enlargement with vascular congestion and mild interstitial edema, improving since previous study.   Electronically Signed   By: Burman Nieves M.D.   On: 12/06/2013 04:58     EKG Interpretation   Date/Time:  Sunday December 06 2013 04:22:37 EDT Ventricular Rate:  80 PR Interval:    QRS Duration: 202 QT Interval:  501 QTC Calculation: 578 R Axis:   127 Text Interpretation:  indeterminate rhythm due to artifact RBBB and LPFB  Otherwise no significant change Confirmed by Bebe Shaggy  MD, Dorinda Hill ([id number])  on 12/06/2013 4:33:39 AM      MDM   Final diagnoses:  Dehydration  Chronic renal failure, unspecified stage  Aggressive behavior    Nursing notes including past medical history and social history reviewed and considered in documentation xrays reviewed and considered Labs/vital reviewed and considered Previous records reviewed and considered     Joya Gaskins, MD 12/06/13 [phone]

## 2013-12-07 DIAGNOSIS — Z87891 Personal history of nicotine dependence: Secondary | ICD-10-CM | POA: Diagnosis not present

## 2013-12-07 DIAGNOSIS — N139 Obstructive and reflux uropathy, unspecified: Secondary | ICD-10-CM | POA: Diagnosis present

## 2013-12-07 DIAGNOSIS — K5900 Constipation, unspecified: Secondary | ICD-10-CM | POA: Diagnosis present

## 2013-12-07 DIAGNOSIS — G40909 Epilepsy, unspecified, not intractable, without status epilepticus: Secondary | ICD-10-CM | POA: Diagnosis present

## 2013-12-07 DIAGNOSIS — I5042 Chronic combined systolic (congestive) and diastolic (congestive) heart failure: Secondary | ICD-10-CM | POA: Diagnosis present

## 2013-12-07 DIAGNOSIS — Z794 Long term (current) use of insulin: Secondary | ICD-10-CM | POA: Diagnosis not present

## 2013-12-07 DIAGNOSIS — I2510 Atherosclerotic heart disease of native coronary artery without angina pectoris: Secondary | ICD-10-CM | POA: Diagnosis present

## 2013-12-07 DIAGNOSIS — Z8249 Family history of ischemic heart disease and other diseases of the circulatory system: Secondary | ICD-10-CM | POA: Diagnosis not present

## 2013-12-07 DIAGNOSIS — J449 Chronic obstructive pulmonary disease, unspecified: Secondary | ICD-10-CM | POA: Diagnosis present

## 2013-12-07 DIAGNOSIS — Z8673 Personal history of transient ischemic attack (TIA), and cerebral infarction without residual deficits: Secondary | ICD-10-CM | POA: Diagnosis not present

## 2013-12-07 DIAGNOSIS — Z66 Do not resuscitate: Secondary | ICD-10-CM | POA: Diagnosis present

## 2013-12-07 DIAGNOSIS — R404 Transient alteration of awareness: Secondary | ICD-10-CM | POA: Diagnosis present

## 2013-12-07 DIAGNOSIS — R339 Retention of urine, unspecified: Secondary | ICD-10-CM | POA: Diagnosis present

## 2013-12-07 DIAGNOSIS — I129 Hypertensive chronic kidney disease with stage 1 through stage 4 chronic kidney disease, or unspecified chronic kidney disease: Secondary | ICD-10-CM | POA: Diagnosis present

## 2013-12-07 DIAGNOSIS — F603 Borderline personality disorder: Secondary | ICD-10-CM | POA: Diagnosis present

## 2013-12-07 DIAGNOSIS — N401 Enlarged prostate with lower urinary tract symptoms: Secondary | ICD-10-CM | POA: Diagnosis present

## 2013-12-07 DIAGNOSIS — N390 Urinary tract infection, site not specified: Secondary | ICD-10-CM | POA: Diagnosis present

## 2013-12-07 DIAGNOSIS — J9610 Chronic respiratory failure, unspecified whether with hypoxia or hypercapnia: Secondary | ICD-10-CM | POA: Diagnosis present

## 2013-12-07 DIAGNOSIS — E860 Dehydration: Secondary | ICD-10-CM | POA: Diagnosis present

## 2013-12-07 DIAGNOSIS — I252 Old myocardial infarction: Secondary | ICD-10-CM | POA: Diagnosis not present

## 2013-12-07 DIAGNOSIS — Z951 Presence of aortocoronary bypass graft: Secondary | ICD-10-CM | POA: Diagnosis not present

## 2013-12-07 DIAGNOSIS — B952 Enterococcus as the cause of diseases classified elsewhere: Secondary | ICD-10-CM | POA: Diagnosis present

## 2013-12-07 DIAGNOSIS — Z79899 Other long term (current) drug therapy: Secondary | ICD-10-CM | POA: Diagnosis not present

## 2013-12-07 DIAGNOSIS — I2589 Other forms of chronic ischemic heart disease: Secondary | ICD-10-CM | POA: Diagnosis present

## 2013-12-07 DIAGNOSIS — M109 Gout, unspecified: Secondary | ICD-10-CM | POA: Diagnosis present

## 2013-12-07 DIAGNOSIS — I509 Heart failure, unspecified: Secondary | ICD-10-CM | POA: Diagnosis present

## 2013-12-07 DIAGNOSIS — I959 Hypotension, unspecified: Secondary | ICD-10-CM | POA: Diagnosis not present

## 2013-12-07 DIAGNOSIS — Z9981 Dependence on supplemental oxygen: Secondary | ICD-10-CM | POA: Diagnosis not present

## 2013-12-07 DIAGNOSIS — F03918 Unspecified dementia, unspecified severity, with other behavioral disturbance: Secondary | ICD-10-CM | POA: Diagnosis present

## 2013-12-07 DIAGNOSIS — E039 Hypothyroidism, unspecified: Secondary | ICD-10-CM | POA: Diagnosis present

## 2013-12-07 DIAGNOSIS — Z792 Long term (current) use of antibiotics: Secondary | ICD-10-CM | POA: Diagnosis not present

## 2013-12-07 DIAGNOSIS — N189 Chronic kidney disease, unspecified: Secondary | ICD-10-CM | POA: Diagnosis present

## 2013-12-07 DIAGNOSIS — E119 Type 2 diabetes mellitus without complications: Secondary | ICD-10-CM | POA: Diagnosis present

## 2013-12-07 DIAGNOSIS — E785 Hyperlipidemia, unspecified: Secondary | ICD-10-CM | POA: Diagnosis present

## 2013-12-07 DIAGNOSIS — R338 Other retention of urine: Secondary | ICD-10-CM | POA: Diagnosis present

## 2013-12-07 DIAGNOSIS — F0391 Unspecified dementia with behavioral disturbance: Secondary | ICD-10-CM | POA: Diagnosis present

## 2013-12-07 LAB — CBC
HEMATOCRIT: 31.5 % — AB (ref 39.0–52.0)
Hemoglobin: 10.4 g/dL — ABNORMAL LOW (ref 13.0–17.0)
MCH: 31.8 pg (ref 26.0–34.0)
MCHC: 33 g/dL (ref 30.0–36.0)
MCV: 96.3 fL (ref 78.0–100.0)
PLATELETS: 219 10*3/uL (ref 150–400)
RBC: 3.27 MIL/uL — ABNORMAL LOW (ref 4.22–5.81)
RDW: 15.8 % — AB (ref 11.5–15.5)
WBC: 4.6 10*3/uL (ref 4.0–10.5)

## 2013-12-07 LAB — COMPREHENSIVE METABOLIC PANEL
ALK PHOS: 22 U/L — AB (ref 39–117)
ALT: 9 U/L (ref 0–53)
AST: 19 U/L (ref 0–37)
Albumin: 2.6 g/dL — ABNORMAL LOW (ref 3.5–5.2)
Anion gap: 11 (ref 5–15)
BUN: 61 mg/dL — ABNORMAL HIGH (ref 6–23)
CALCIUM: 9.6 mg/dL (ref 8.4–10.5)
CO2: 28 mEq/L (ref 19–32)
Chloride: 103 mEq/L (ref 96–112)
Creatinine, Ser: 2.11 mg/dL — ABNORMAL HIGH (ref 0.50–1.35)
GFR calc non Af Amer: 28 mL/min — ABNORMAL LOW (ref >90)
GFR, EST AFRICAN AMERICAN: 33 mL/min — AB (ref >90)
GLUCOSE: 109 mg/dL — AB (ref 70–99)
POTASSIUM: 3.5 meq/L — AB (ref 3.7–5.3)
SODIUM: 142 meq/L (ref 137–147)
Total Bilirubin: 0.3 mg/dL (ref 0.3–1.2)
Total Protein: 6.4 g/dL (ref 6.0–8.3)

## 2013-12-07 LAB — GLUCOSE, CAPILLARY
GLUCOSE-CAPILLARY: 114 mg/dL — AB (ref 70–99)
GLUCOSE-CAPILLARY: 102 mg/dL — AB (ref 70–99)
GLUCOSE-CAPILLARY: 131 mg/dL — AB (ref 70–99)
Glucose-Capillary: 117 mg/dL — ABNORMAL HIGH (ref 70–99)

## 2013-12-07 MED ORDER — BOOST / RESOURCE BREEZE PO LIQD
1.0000 | Freq: Three times a day (TID) | ORAL | Status: DC
  Administered 2013-12-07 (×2): 1 via ORAL
  Administered 2013-12-08: 21:00:00 via ORAL
  Administered 2013-12-08 – 2013-12-09 (×2): 1 via ORAL

## 2013-12-07 MED ORDER — ENOXAPARIN SODIUM 30 MG/0.3ML ~~LOC~~ SOLN
30.0000 mg | Freq: Every day | SUBCUTANEOUS | Status: DC
  Administered 2013-12-07 – 2013-12-09 (×3): 30 mg via SUBCUTANEOUS
  Filled 2013-12-07 (×4): qty 0.3

## 2013-12-07 MED ORDER — LEVOFLOXACIN IN D5W 750 MG/150ML IV SOLN
750.0000 mg | INTRAVENOUS | Status: DC
  Administered 2013-12-08: 750 mg via INTRAVENOUS
  Filled 2013-12-07 (×2): qty 150

## 2013-12-07 MED ORDER — SODIUM CHLORIDE 0.9 % IV SOLN
INTRAVENOUS | Status: DC
  Administered 2013-12-07 – 2013-12-09 (×4): via INTRAVENOUS

## 2013-12-07 NOTE — Progress Notes (Addendum)
Initial Nutrition Assessment INTERVENTION:  Resource Breeze po TID, each supplement provides 250 kcal and 9 grams of protein   Recommend ST to evaluate current swallow function   Advance diet per ST recommendations  RD will follow for nutrition care  NUTRITION DIAGNOSIS: Inadequate oral intake related to dementia disease progression? as evidenced by changes in ADL's (unable to feed himself), diet hx and 20% weight loss in < 4 months.   Goal: Pt to meet >/= 90% of their estimated nutrition needs; if congruent with with pt healthcare goals   Monitor:  Diet advancement, supplement acceptance and percent meal intake  Reason for Assessment: Malnutrition Screen Score = 5   80 y.o. male  Admitting Dx: Delirium  ASSESSMENT: Pt has his eyes open but is slow to respond to questions. His daughter Lupita Leash provided brief hx. Pt lives with her.   He has been unable to feed himself since last admission. Pt weight hx shows 20% unplanned weight loss since April and 4% in 2 weeks.  She says he stopped eating about a week ago. Suspect that pt's intake has been chronically insufficient for several months in order for his weight to decrease with such significance. Daughter says pt has quit eating bread and has difficulty chewing/swallowing meats that are not chopped. He eats mostly soft foods such as mash potatoes.  Height: Ht Readings from Last 1 Encounters:  12/06/13 5\' 7"  (1.702 m)    Weight: Wt Readings from Last 1 Encounters:  12/06/13 179 lb 7.3 oz (81.4 kg)  12/07/13-186#   Ideal Body Weight: 148# (67 kg)  % Ideal Body Weight: 121%  Wt Readings from Last 10 Encounters:  12/06/13 179 lb 7.3 oz (81.4 kg)  11/18/13 194 lb 0.1 oz (88 kg)  08/25/13 223 lb 1.7 oz (101.2 kg)  03/12/13 227 lb 12.8 oz (103.329 kg)  08/12/12 248 lb (112.492 kg)  07/17/12 257 lb 8 oz (116.8 kg)  01/08/12 262 lb 8 oz (119.069 kg)  10/02/11 254 lb  (115.214 kg)  06/28/11 257 lb (116.574 kg)  05/24/11 247 lb 5.7 oz (112.2 kg)    Usual Body Weight: 225-230#  % Usual Body Weight: 80%  BMI:  Body mass index is 28.1 kg/(m^2). overweight  Estimated Nutritional Needs: Kcal: 1800-2100 Protein: 80-90 gr Fluid: >2000 ml daily  Skin: intact  Diet Order: Clear Liquid  EDUCATION NEEDS: -Education not appropriate at this time   Intake/Output Summary (Last 24 hours) at 12/07/13 0758 Last data filed at 12/07/13 0657  Gross per 24 hour  Intake 1821.25 ml  Output   1000 ml  Net 821.25 ml    Last BM: 12/06/13  Labs:   Recent Labs Lab 12/06/13 0424 12/07/13 0531  NA 137 142  K 3.4* 3.5*  CL 93* 103  CO2 27 28  BUN 66* 61*  CREATININE 2.08* 2.11*  CALCIUM 10.5 9.6  GLUCOSE 180* 109*    CBG (last 3)   Recent Labs  12/06/13 1638 12/06/13 2145 12/07/13 0748  GLUCAP 180* 142* 114*    Scheduled Meds: . allopurinol  300 mg Oral q morning - 10a  . amiodarone  200 mg Oral Daily  . amLODipine  5 mg Oral q morning - 10a  . atorvastatin  40 mg Oral QHS  . bisacodyl  10 mg Rectal Once  . carvedilol  25 mg Oral BID WC  . dipyridamole-aspirin  1 capsule Oral BID  . docusate sodium  100 mg Oral BID  . enoxaparin (LOVENOX) injection  40 mg Subcutaneous Daily  . fenofibrate  160 mg Oral q morning - 10a  . hydrALAZINE  50 mg Oral QID  . insulin aspart  0-9 Units Subcutaneous TID WC  . insulin glargine  25 Units Subcutaneous QHS  . isosorbide mononitrate  30 mg Oral Daily  . levETIRAcetam  500 mg Oral BID  . levofloxacin (LEVAQUIN) IV  500 mg Intravenous Q24H  . levothyroxine  200 mcg Oral QAC breakfast  . levothyroxine  25 mcg Oral QAC breakfast  . losartan  25 mg Oral Daily  . phenytoin  100 mg Oral q morning - 10a  . phenytoin  300 mg Oral QHS  . senna  1 tablet Oral BID  . sodium chloride  3 mL Intravenous Q12H  . spironolactone  12.5 mg Oral Daily  . tamsulosin  0.4 mg Oral BID    Continuous Infusions:    Past Medical History  Diagnosis Date  . Coronary atherosclerosis of native coronary artery 2000    CABG 2000 following acute MI - refused followup cardiac catheterization 2012  . Gout   . Hypothyroidism   . Seizures   . Ventricular tachycardia     On amiodarone  . Diabetes mellitus, type 2   . Cerebrovascular disease 2004    Left cerebral CVA [place]  . Essential hypertension, benign   . Gastritis 2004    Gastric erosions  . Hyperlipidemia   . Ischemic cardiomyopathy     LVEF 45% 2013    Past Surgical History  Procedure Laterality Date  . Coronary artery bypass graft  04/1999    Dr. Barry Dieneswens  . Carotid endarterectomy    . Cholecystectomy      Royann ShiversLynn Exie Chrismer MS,RD,CSG,LDN Office: [phone] Pager: [phone]

## 2013-12-07 NOTE — Progress Notes (Signed)
Subjective: He is much better today. He had trouble with hypotension yesterday and required several fluid boluses. He has no other new complaints. He did have acute urinary retention which has been resolved with Foley catheter.  Objective: Vital signs in last 24 hours: Temp:  [97.4 F (36.3 C)-97.7 F (36.5 C)] 97.5 F (36.4 C) (08/03 0620) Pulse Rate:  [58-69] 58 (08/03 0620) Resp:  [20] 20 (08/03 0620) BP: (99-123)/(38-44) 123/44 mmHg (08/03 0620) SpO2:  [93 %-98 %] 98 % (08/03 0620) Weight change: -0.247 kg (-8.7 oz) Last BM Date: 12/06/13  Intake/Output from previous day: 08/02 0701 - 08/03 0700 In: 1821.3 [P.O.:120; I.V.:1601.3; IV Piggyback:100] Out: 1000 [Urine:1000]  PHYSICAL EXAM General appearance: alert, cooperative and no distress Resp: clear to auscultation bilaterally Cardio: regular rate and rhythm, S1, S2 normal, no murmur, click, rub or gallop GI: soft, non-tender; bowel sounds normal; no masses,  no organomegaly Extremities: extremities normal, atraumatic, no cyanosis or edema  Lab Results:  Results for orders placed during the hospital encounter of 12/06/13 (from the past 48 hour(s))  CBG MONITORING, ED     Status: Abnormal   Collection Time    12/06/13  3:49 AM      Result Value Ref Range   Glucose-Capillary 172 (*) 70 - 99 mg/dL  CBC WITH DIFFERENTIAL     Status: Abnormal   Collection Time    12/06/13  4:24 AM      Result Value Ref Range   WBC 7.0  4.0 - 10.5 K/uL   RBC 4.02 (*) 4.22 - 5.81 MIL/uL   Hemoglobin 13.0  13.0 - 17.0 g/dL   HCT 37.9 (*) 39.0 - 52.0 %   MCV 94.3  78.0 - 100.0 fL   MCH 32.3  26.0 - 34.0 pg   MCHC 34.3  30.0 - 36.0 g/dL   RDW 15.4  11.5 - 15.5 %   Platelets 259  150 - 400 K/uL   Neutrophils Relative % 77  43 - 77 %   Neutro Abs 5.4  1.7 - 7.7 K/uL   Lymphocytes Relative 12  12 - 46 %   Lymphs Abs 0.8  0.7 - 4.0 K/uL   Monocytes Relative 10  3 - 12 %   Monocytes Absolute 0.7  0.1 - 1.0 K/uL   Eosinophils Relative 1  0  - 5 %   Eosinophils Absolute 0.1  0.0 - 0.7 K/uL   Basophils Relative 0  0 - 1 %   Basophils Absolute 0.0  0.0 - 0.1 K/uL  BASIC METABOLIC PANEL     Status: Abnormal   Collection Time    12/06/13  4:24 AM      Result Value Ref Range   Sodium 137  137 - 147 mEq/L   Potassium 3.4 (*) 3.7 - 5.3 mEq/L   Chloride 93 (*) 96 - 112 mEq/L   CO2 27  19 - 32 mEq/L   Glucose, Bld 180 (*) 70 - 99 mg/dL   BUN 66 (*) 6 - 23 mg/dL   Creatinine, Ser 2.08 (*) 0.50 - 1.35 mg/dL   Calcium 10.5  8.4 - 10.5 mg/dL   GFR calc non Af Amer 29 (*) >90 mL/min   GFR calc Af Amer 33 (*) >90 mL/min   Comment: (NOTE)     The eGFR has been calculated using the CKD EPI equation.     This calculation has not been validated in all clinical situations.     eGFR's persistently <90 mL/min  signify possible Chronic Kidney     Disease.   Anion gap 17 (*) 5 - 15  PHENYTOIN LEVEL, TOTAL     Status: None   Collection Time    12/06/13  4:24 AM      Result Value Ref Range   Phenytoin Lvl 11.3  10.0 - 20.0 ug/mL  URINALYSIS, ROUTINE W REFLEX MICROSCOPIC     Status: Abnormal   Collection Time    12/06/13  4:43 AM      Result Value Ref Range   Color, Urine YELLOW  YELLOW   APPearance CLEAR  CLEAR   Specific Gravity, Urine <1.005 (*) 1.005 - 1.030   pH 6.0  5.0 - 8.0   Glucose, UA NEGATIVE  NEGATIVE mg/dL   Hgb urine dipstick NEGATIVE  NEGATIVE   Bilirubin Urine NEGATIVE  NEGATIVE   Ketones, ur NEGATIVE  NEGATIVE mg/dL   Protein, ur NEGATIVE  NEGATIVE mg/dL   Urobilinogen, UA 0.2  0.0 - 1.0 mg/dL   Nitrite NEGATIVE  NEGATIVE   Leukocytes, UA NEGATIVE  NEGATIVE   Comment: MICROSCOPIC NOT DONE ON URINES WITH NEGATIVE PROTEIN, BLOOD, LEUKOCYTES, NITRITE, OR GLUCOSE <1000 mg/dL.  GLUCOSE, CAPILLARY     Status: Abnormal   Collection Time    12/06/13  8:02 AM      Result Value Ref Range   Glucose-Capillary 174 (*) 70 - 99 mg/dL   Comment 1 Notify RN    CULTURE, BLOOD (ROUTINE X 2)     Status: None   Collection Time     12/06/13  9:18 AM      Result Value Ref Range   Specimen Description BLOOD RIGHT ANTECUBITAL 10CC     Special Requests       Value: BOTTLES DRAWN AEROBIC ONLY     Performed at [facility] PENDING     Report Status PENDING    CULTURE, BLOOD (ROUTINE X 2)     Status: None   Collection Time    12/06/13  9:18 AM      Result Value Ref Range   Specimen Description LEFT ANTECUBITAL     Special Requests BOTTLES DRAWN AEROBIC AND ANAEROBIC 16 CC     Culture PENDING     Report Status PENDING    GLUCOSE, CAPILLARY     Status: Abnormal   Collection Time    12/06/13 12:00 PM      Result Value Ref Range   Glucose-Capillary 203 (*) 70 - 99 mg/dL   Comment 1 Notify RN    GLUCOSE, CAPILLARY     Status: Abnormal   Collection Time    12/06/13  4:38 PM      Result Value Ref Range   Glucose-Capillary 180 (*) 70 - 99 mg/dL   Comment 1 Notify RN    GLUCOSE, CAPILLARY     Status: Abnormal   Collection Time    12/06/13  9:45 PM      Result Value Ref Range   Glucose-Capillary 142 (*) 70 - 99 mg/dL  CBC     Status: Abnormal   Collection Time    12/07/13  5:31 AM      Result Value Ref Range   WBC 4.6  4.0 - 10.5 K/uL   RBC 3.27 (*) 4.22 - 5.81 MIL/uL   Hemoglobin 10.4 (*) 13.0 - 17.0 g/dL   Comment: DELTA CHECK NOTED     RESULT REPEATED AND VERIFIED   HCT 31.5 (*) 39.0 - 52.0 %  MCV 96.3  78.0 - 100.0 fL   MCH 31.8  26.0 - 34.0 pg   MCHC 33.0  30.0 - 36.0 g/dL   RDW 15.8 (*) 11.5 - 15.5 %   Platelets 219  150 - 400 K/uL  COMPREHENSIVE METABOLIC PANEL     Status: Abnormal   Collection Time    12/07/13  5:31 AM      Result Value Ref Range   Sodium 142  137 - 147 mEq/L   Potassium 3.5 (*) 3.7 - 5.3 mEq/L   Chloride 103  96 - 112 mEq/L   Comment: DELTA CHECK NOTED   CO2 28  19 - 32 mEq/L   Glucose, Bld 109 (*) 70 - 99 mg/dL   BUN 61 (*) 6 - 23 mg/dL   Creatinine, Ser 2.11 (*) 0.50 - 1.35 mg/dL   Calcium 9.6  8.4 - 10.5 mg/dL   Total Protein 6.4  6.0 - 8.3  g/dL   Albumin 2.6 (*) 3.5 - 5.2 g/dL   AST 19  0 - 37 U/L   ALT 9  0 - 53 U/L   Alkaline Phosphatase 22 (*) 39 - 117 U/L   Total Bilirubin 0.3  0.3 - 1.2 mg/dL   GFR calc non Af Amer 28 (*) >90 mL/min   GFR calc Af Amer 33 (*) >90 mL/min   Comment: (NOTE)     The eGFR has been calculated using the CKD EPI equation.     This calculation has not been validated in all clinical situations.     eGFR's persistently <90 mL/min signify possible Chronic Kidney     Disease.   Anion gap 11  5 - 15  GLUCOSE, CAPILLARY     Status: Abnormal   Collection Time    12/07/13  7:48 AM      Result Value Ref Range   Glucose-Capillary 114 (*) 70 - 99 mg/dL   Comment 1 Notify RN      ABGS No results found for this basename: PHART, PCO2, PO2ART, TCO2, HCO3,  in the last 72 hours CULTURES Recent Results (from the past 240 hour(s))  CULTURE, BLOOD (ROUTINE X 2)     Status: None   Collection Time    12/06/13  9:18 AM      Result Value Ref Range Status   Specimen Description BLOOD RIGHT ANTECUBITAL 10CC   Final   Special Requests     Final   Value: BOTTLES DRAWN AEROBIC ONLY     Performed at [facility]   Culture PENDING   Incomplete   Report Status PENDING   Incomplete  CULTURE, BLOOD (ROUTINE X 2)     Status: None   Collection Time    12/06/13  9:18 AM      Result Value Ref Range Status   Specimen Description LEFT ANTECUBITAL   Final   Special Requests BOTTLES DRAWN AEROBIC AND ANAEROBIC 16 CC   Final   Culture PENDING   Incomplete   Report Status PENDING   Incomplete   Studies/Results: Ct Head Wo Contrast  12/06/2013   CLINICAL DATA:  Altered mental status  EXAM: CT HEAD WITHOUT CONTRAST  TECHNIQUE: Contiguous axial images were obtained from the base of the skull through the vertex without intravenous contrast.  COMPARISON:  11/11/2013  FINDINGS: No evidence of parenchymal hemorrhage or extra-axial fluid collection. No mass lesion, mass effect, or midline shift.  No CT  evidence of acute infarction.  Old left MCA distribution infarct.  Subcortical white matter and periventricular small vessel ischemic changes. Intracranial atherosclerosis.  Global cortical atrophy.  No ventriculomegaly.  The visualized paranasal sinuses are essentially clear. The mastoid air cells are unopacified.  No evidence of calvarial fracture.  IMPRESSION: No evidence of acute intracranial abnormality.  Old MCA distribution infarct.  Atrophy with small vessel ischemic changes and intracranial atherosclerosis.   Electronically Signed   By: Julian Hy M.D.   On: 12/06/2013 10:16   Dg Chest Portable 1 View  12/06/2013   CLINICAL DATA:  Altered mental status.  EXAM: PORTABLE CHEST - 1 VIEW  COMPARISON:  11/17/2013  FINDINGS: Postoperative changes in the mediastinum. Shallow inspiration. Mild cardiac enlargement and pulmonary vascular congestion. Slight interstitial edema. Improvement since previous study. No focal consolidation. No pneumothorax. No blunting of costophrenic angles.  IMPRESSION: Cardiac enlargement with vascular congestion and mild interstitial edema, improving since previous study.   Electronically Signed   By: Lucienne Capers M.D.   On: 12/06/2013 04:58   Dg Abd 2 Views  12/06/2013   CLINICAL DATA:  Altered mental status.  Abdominal pain.  EXAM: ABDOMEN - 2 VIEW  COMPARISON:  CT abdomen and pelvis 10/21/2009.  Abdomen 04/26/2009.  FINDINGS: The bowel gas pattern is normal. There is no evidence of free air. Vascular calcifications. No radio-opaque calculi or other significant radiographic abnormality is seen.  IMPRESSION: Nonobstructive bowel gas pattern.   Electronically Signed   By: Lucienne Capers M.D.   On: 12/06/2013 06:43    Medications:  Prior to Admission:  Prescriptions prior to admission  Medication Sig Dispense Refill  . allopurinol (ZYLOPRIM) 300 MG tablet Take 300 mg by mouth every morning.       Marland Kitchen ALPRAZolam (XANAX) 1 MG tablet Take 1 mg by mouth 3 (three) times  daily as needed for anxiety.      Marland Kitchen amiodarone (PACERONE) 200 MG tablet Take 1 tablet (200 mg total) by mouth daily.  30 tablet  12  . amLODipine (NORVASC) 5 MG tablet Take 5 mg by mouth every morning.       Marland Kitchen atorvastatin (LIPITOR) 80 MG tablet Take 40 mg by mouth at bedtime.       . carvedilol (COREG) 25 MG tablet Take 25 mg by mouth 2 (two) times daily with a meal.      . dipyridamole-aspirin (AGGRENOX) 25-200 MG per 12 hr capsule Take 1 capsule by mouth 2 (two) times daily.      . fenofibrate 160 MG tablet Take 160 mg by mouth every morning.       Marland Kitchen glimepiride (AMARYL) 4 MG tablet Take 4 mg by mouth 2 (two) times daily with a meal.       . hydrALAZINE (APRESOLINE) 50 MG tablet Take 50 mg by mouth 4 (four) times daily.      Marland Kitchen HYDROcodone-acetaminophen (NORCO/VICODIN) 5-325 MG per tablet Take 1 tablet by mouth every 6 (six) hours as needed for pain.      Marland Kitchen insulin glargine (LANTUS) 100 UNIT/ML injection Inject 25 Units into the skin at bedtime. If sugar is too low none is given      . isosorbide mononitrate (IMDUR) 30 MG 24 hr tablet Take 1 tablet (30 mg total) by mouth daily.  30 tablet  12  . levETIRAcetam (KEPPRA) 500 MG tablet Take 500 mg by mouth 2 (two) times daily.      Marland Kitchen levothyroxine (SYNTHROID, LEVOTHROID) 200 MCG tablet Takes 217mg tablet and 25 mcg tablet for total dose of 2245m      .  levothyroxine (SYNTHROID, LEVOTHROID) 25 MCG tablet Takes 274mg tablet and 25 mcg tablet for total dose of 2232m      . LINZESS 145 MCG CAPS capsule Take 145 mcg by mouth daily.      . Marland Kitchenosartan (COZAAR) 25 MG tablet Take 25 mg by mouth daily.      . metolazone (ZAROXOLYN) 5 MG tablet Take 5 mg by mouth 2 (two) times daily as needed. Swelling      . phenytoin (DILANTIN) 100 MG ER capsule Take 100-300 mg by mouth 2 (two) times daily. Patient take 100 in the morning and 300 at night      . potassium chloride (K-DUR) 10 MEQ tablet Take 20 mEq by mouth 3 (three) times daily.       . promethazine  (PHENERGAN) 25 MG tablet Take 12.5-25 tablets by mouth daily as needed for nausea.       . Marland Kitchenpironolactone (ALDACTONE) 25 MG tablet Take 12.5 mg by mouth daily.      . Tamsulosin HCl (FLOMAX) 0.4 MG CAPS Take 0.4 mg by mouth 2 (two) times daily.      . Marland Kitchenorsemide (DEMADEX) 20 MG tablet Take 1 tablet (20 mg total) by mouth 2 (two) times daily.  60 tablet  12  . [DISCONTINUED] phenytoin (DILANTIN) 100 MG ER capsule Take 3 capsules (300 mg total) by mouth at bedtime. Patient take 100 in the morning and 300 at night  90 capsule  12  . ciprofloxacin (CILOXAN) 0.3 % ophthalmic solution Place 1 drop into both eyes as needed (Only uses as needed for flares).       . nitroGLYCERIN (NITROSTAT) 0.4 MG SL tablet Place 0.4 mg under the tongue every 5 (five) minutes x 3 doses as needed for chest pain.       Scheduled: . allopurinol  300 mg Oral q morning - 10a  . amiodarone  200 mg Oral Daily  . amLODipine  5 mg Oral q morning - 10a  . atorvastatin  40 mg Oral QHS  . bisacodyl  10 mg Rectal Once  . carvedilol  25 mg Oral BID WC  . dipyridamole-aspirin  1 capsule Oral BID  . docusate sodium  100 mg Oral BID  . enoxaparin (LOVENOX) injection  40 mg Subcutaneous Daily  . fenofibrate  160 mg Oral q morning - 10a  . hydrALAZINE  50 mg Oral QID  . insulin aspart  0-9 Units Subcutaneous TID WC  . insulin glargine  25 Units Subcutaneous QHS  . isosorbide mononitrate  30 mg Oral Daily  . levETIRAcetam  500 mg Oral BID  . levofloxacin (LEVAQUIN) IV  500 mg Intravenous Q24H  . levothyroxine  200 mcg Oral QAC breakfast  . levothyroxine  25 mcg Oral QAC breakfast  . losartan  25 mg Oral Daily  . phenytoin  100 mg Oral q morning - 10a  . phenytoin  300 mg Oral QHS  . senna  1 tablet Oral BID  . sodium chloride  3 mL Intravenous Q12H  . spironolactone  12.5 mg Oral Daily  . tamsulosin  0.4 mg Oral BID   Continuous:  PRTSV:XBLTJQhloride, acetaminophen, acetaminophen, LORazepam, sodium chloride  Assesment: He  was admitted with delirium which may have been related to acute urinary retention. He is being checked again for urinary tract infection. He has multiple other medical problems including dementia. He is known to have ischemic heart disease and congestive heart failure. He has seizure disorder but did not have  any witnessed seizures prior to becoming more delirious Principal Problem:   Delirium Active Problems:   HYPOTHYROIDISM   HYPERTENSION   Diabetes mellitus, type 2   Ischemic heart disease   Dementia with behavioral disturbance   Chronic hypoxemic respiratory failure   Dehydration   Seizure disorder    Plan: Continue with current treatments    LOS: 1 day   Vernor Monnig L 12/07/2013, 8:30 AM

## 2013-12-07 NOTE — Progress Notes (Signed)
UR completed 

## 2013-12-07 NOTE — Clinical Social Work Psychosocial (Signed)
     Clinical Social Work Department BRIEF PSYCHOSOCIAL ASSESSMENT 12/07/2013  Patient:  Donald Owen,Donald Owen     Account Number:  [id number]     Admit date:  12/06/2013  Clinical Social Worker:  Santa GeneraUNNINGHAM,ANNE, CLINICAL SOCIAL WORKER  Date/Time:  12/07/2013 03:00 PM  Referred by:  Physician  Date Referred:  12/07/2013 Referred for  SNF Placement   Other Referral:   Interview type:  Family Other interview type:   Patient not oriented x4, spoke w daughter in room who says patient is adequately cared for at home and does not want patient placed in SNF    PSYCHOSOCIAL DATA Living Status:  FAMILY Admitted from facility:   Level of care:   Primary support name:  Donald Owen Primary support relationship to patient:  CHILD, ADULT Degree of support available:   Donald Owen is patient's primary caregiver at home, has cared for patient for quite some time, wants to continue.  Feels she is able to provide for patient's needs.    CURRENT CONCERNS Current Concerns  Post-Acute Placement   Other Concerns:    SOCIAL WORK ASSESSMENT / PLAN CSW unable to assess patient directly, patient not oriented x4.  Patient's daughter, Donald Owen, is at bedside.  CSW discussed MD and PT recommendation for short term SNF rehab placement.  Daughter declines - states "I know someone who was in [facility] for 4 months and they took their home."  Patient owns home where patient , daughter and daughter's disabled husband live.  Husband recovering from recent heart attack, daughter says he "takes care of himself."  Patient has been able to assist w bed placement by pushing his feet against footboard, has been able to "roll himself from side to side", has not been able to stand or walk since April or May 2015.   Daughter wants patient to return home, is concerned about possibility of losing home as well as patient's inability to pay copays or deductibles for SNF placement.  Is also concerned about patient needing urology  consult "why can't they just do that in the hospital?"    Daughter says she has spoken w RN CM about home health, understands that he may only get limited help w aide and PT.  States she is willing to provide continued assistance w care at home.  says she is having meeting w siblings to discuss their role in caring for patient at home.  CSW sigining off as no SW needs identified at present - family declining SNF at present.   Assessment/plan status:  No Further Intervention Required Other assessment/ plan:   Information/referral to community resources:   SNF list given although placement process is suspended at family request.    PATIENTS/FAMILYS RESPONSE TO PLAN OF CARE: Daughter adamant that she wants patient to return home, fears that she may lose her housing (which patient owns) if patient is placed in SNF.

## 2013-12-07 NOTE — Care Management Note (Addendum)
    Page 1 of 2   12/10/2013     9:10:48 AM CARE MANAGEMENT NOTE 12/10/2013  Patient:  Donald Owen,Donald Owen   Account Number:  [id number]  Date Initiated:  12/07/2013  Documentation initiated by:  Sharrie RothmanBLACKWELL,Earlee Herald Owen  Subjective/Objective Assessment:   Pt admitted from home with possible UTI. Pt lives with his daughter and will return home at discharge. Pt has [facility] hospital bed, cane, and BSC for home use. Pt has home O2 with [facility] and HH RN and PT with [facility].     Action/Plan:   Daughter would like to resume HH with [facility] at discharge and would also like to add and aide. Will continue to follow for discharge planning needs.   Anticipated DC Date:  12/09/2013   Anticipated DC Plan:  HOME W HOME HEALTH SERVICES      DC Planning Services  CM consult      [facility]  Resumption Of Svcs/PTA Provider   Choice offered to / List presented to:  [facility]-4 Adult Children        HH arranged  HH-1 RN  HH-2 PT  HH-4 NURSE'S AIDE      HH agency  [facility]   Status of service:  Completed, signed off Medicare Important Message given?  YES (If response is "NO", the following Medicare IM given date fields will be blank) Date Medicare IM given:  12/10/2013 Medicare IM given by:  Sharrie RothmanBLACKWELL,Donald Owen Date Additional Medicare IM given:   Additional Medicare IM given by:    Discharge Disposition:  HOME W HOME HEALTH SERVICES  Per UR Regulation:    If discussed at Long Length of Stay Meetings, dates discussed:    Comments:  12/10/13 0910 Donald Queenammy Ayriel Texidor, RN BSN CM Pt [facility] today [facility]per pts [facility]). Donald BailiffLinda [facility] is aware and will collect the pts information from the chart. HH services to start within 48 hours of discharge. No DME needs noted. Pts daughter and pts nurse aware of discharge arrangements.  12/07/13 1140 Donald Queenammy Amarri Satterly, RN BSN CM

## 2013-12-07 NOTE — Progress Notes (Signed)
Pharmacy:  Estimated Creatinine Clearance: 29.5 ml/min (by C-G formula based on Cr of 2.11).   Note: Levaquin dose adjusted to 750mg  IV every 48 hours due to renal dysfunction.  Donald Owen, Donald Owen, [facility] ServicesRPH 12/07/2013 9:52 AM

## 2013-12-07 NOTE — Evaluation (Signed)
Physical Therapy Evaluation Patient Details Name: Donald Owen MRN: [id number] DOB: 12/26/1933 Today's Date: 12/07/2013   History of Present Illness   80 year old male who has a past medical history of Coronary atherosclerosis of native coronary artery (2000); Gout; Hypothyroidism; Seizures; Ventricular tachycardia; Diabetes mellitus, type 2; Cerebrovascular disease (2004); Essential hypertension, benign; Gastritis (2004); Hyperlipidemia; and Ischemic cardiomyopathy.  As per family patient has been agitated over the past 2 days, with very poor by mouth intake. Patient had constipation, and they tried over-the-counter Colace which did help somewhat, but last night again patient complained of abdominal pain. Patient has history of dementia with behavioral disturbance, and has been yelling and agitated.  No other symptoms as per patient's daughter, no nausea vomiting, no chest pain. Patient does use chronic home oxygen for chronic hypoxic respiratory failure.     Clinical Impression  Pt is a 80 year old male who presents to physical therapy for assessment of functional mobility skills.   Pt was last admitted to the hospital on 7/08, and discharged home 11/18/13 with HHPT.  At the time, PT recommendation for SNF as pt was max assist for bed mobility skills and was unable to stand.  Family declined, and HHPT has been working on bed mobility skills.  Per family, pt has progressed and is able to roll in bed mod (I) and requires minimal assistance [facility]  Per family, pt has not been able to stand or ambulate since last hospitalization due to generalized weakness.  During evaluation today, pt was agitated and refused to participate in functional mobility skills.  Started to transfer to sitting EOB, however before completing transfer pt became agitated and resisted movement shouting "no".  Attempted ROM exercises in the bed, for assessment of AROM/strength, though pt resisted all PROM and  did not actively participate in ROM exercises.  Unable to assess further mobility skills secondary to agitation.  Recommend continued PT for formally assess functional mobility skills, and increase strength with transition to SNF.  Family understands PT recommendation for SNF placement for rehab, however declines services and would like to restart HHPT.  Family also questioning possibility for home health aide to assist with ADLs.  Case manager made aware of PT recommendation, and family requests.  Pt may benefit from use of W/C for functional mobility skills, as pt is unable to ambulate or stand at this time.       Follow Up Recommendations SNF (Family declines, would perfer to restart HHPT services)    Equipment Recommendations  Wheelchair (measurements PT)       Precautions / Restrictions Precautions Precautions: Fall Restrictions Weight Bearing Restrictions: No      Mobility  Bed Mobility               General bed mobility comments: Unable to assess as pt became agitated and resisting movement during attempted transfers, shouting "no".    Transfers                 General transfer comment: Unable to attempt as pt agitated and refusing to transfer in bed.   Ambulation/Gait             General Gait Details: Unable to attempt as pt agitated and refusing to transfer.      Balance Overall balance assessment:  (Unable to assess as pt agitated and refusing to tranfer to EOB.  Per family, HHPT has been working on sitting balance and pt able to maintain  with supervision. )                                           Pertinent Vitals/Pain PAINAD 0     Home Living Family/patient expects to be discharged to:: Unsure Living Arrangements: Children               Additional Comments: Pt lives in a single story home with a ramped entrance.  He lives with his daughter and son in law who are available 24/7 to assist as needed.  DME: walker, hospital  bed, BSC    Prior Function Level of Independence: Needs assistance   Gait / Transfers Assistance Needed: Per daughter, pt required minimal assist with bed mobility, transfers, and household amb skills with use of walker. However, since last hospitalization, pt has not been able to stand or ambulation; HHPT has been working on bed mobility skills, and per family pt has been able to transfer to EOB with limited assistance and scoot to [facility].   ADL's / Homemaking Assistance Needed: Per daughter, pt required some assistance with bed bathing and dressing.               Extremity/Trunk Assessment               Lower Extremity Assessment: Generalized weakness RLE Deficits / Details: Unable to follow commands for MMT.  Unable to fully assess ROM as pt was resisting all mobility skills.  LLE Deficits / Details: Unable to follow commands for MMT.  Unable to fully assess ROM as pt was resisting all mobility skills.       Communication   Communication: No difficulties  Cognition Arousal/Alertness: Awake/alert Behavior During Therapy: Agitated Overall Cognitive Status: History of cognitive impairments - at baseline                               Assessment/Plan    PT Assessment Patient needs continued PT services  PT Diagnosis Difficulty walking;Generalized weakness   PT Problem List Decreased strength;Decreased activity tolerance;Decreased balance;Decreased mobility;Decreased cognition  PT Treatment Interventions Gait training;Functional mobility training;Therapeutic activities;Therapeutic exercise;Patient/family education;Neuromuscular re-education;Balance training   PT Goals (Current goals can be found in the Care Plan section) Acute Rehab PT Goals Patient Stated Goal: go home PT Goal Formulation: With family Time For Goal Achievement: 12/21/13 Potential to Achieve Goals: Fair    Frequency Min 3X/week    End of Session Equipment Utilized During Treatment:  Oxygen Activity Tolerance: Treatment limited secondary to agitation Patient left: in bed;with call bell/phone within reach;with bed alarm set;with family/visitor present      Functional Limitation: Mobility: Walking and moving around Mobility: Walking and Moving Around Current Status [phone]): 100 percent impaired, limited or restricted Mobility: Walking and Moving Around Goal Status [phone]): At least 60 percent but less than 80 percent impaired, limited or restricted    Time: 0981-1914 PT Time Calculation (min): 19 min   Charges:   PT Evaluation $Initial PT Evaluation Tier I: 1 Procedure     PT G Codes:     Functional Limitation: Mobility: Walking and moving around    [facility] 12/07/2013, 10:48 AM

## 2013-12-08 LAB — GLUCOSE, CAPILLARY
GLUCOSE-CAPILLARY: 96 mg/dL (ref 70–99)
Glucose-Capillary: 91 mg/dL (ref 70–99)
Glucose-Capillary: 146 mg/dL — ABNORMAL HIGH (ref 70–99)
Glucose-Capillary: 143 mg/dL — ABNORMAL HIGH (ref 70–99)

## 2013-12-08 NOTE — Progress Notes (Signed)
Subjective: He seems about the same. He has no new complaints. His breathing is okay.  Objective: Vital signs in last 24 hours: Temp:  [97.3 F (36.3 C)-97.7 F (36.5 C)] 97.7 F (36.5 C) (08/04 0428) Pulse Rate:  [63-75] 74 (08/04 0428) Resp:  [20] 20 (08/04 0428) BP: (121-151)/(37-52) 130/47 mmHg (08/04 0428) SpO2:  [100 %] 100 % (08/04 0428) Weight change:  Last BM Date: 12/06/13  Intake/Output from previous day: 08/03 0701 - 08/04 0700 In: 1898.8 [P.O.:630; I.V.:1268.8] Out: 1200 [Urine:1200]  PHYSICAL EXAM General appearance: alert, cooperative and no distress Resp: rhonchi bilaterally Cardio: regular rate and rhythm, S1, S2 normal, no murmur, click, rub or gallop GI: soft, non-tender; bowel sounds normal; no masses,  no organomegaly Extremities: venous stasis dermatitis noted  Lab Results:  Results for orders placed during the hospital encounter of 12/06/13 (from the past 48 hour(s))  CULTURE, BLOOD (ROUTINE X 2)     Status: None   Collection Time    12/06/13  9:18 AM      Result Value Ref Range   Specimen Description BLOOD RIGHT ANTECUBITAL 10CC     Special Requests       Value: BOTTLES DRAWN AEROBIC ONLY     Performed at [place] 1 DAY     Report Status PENDING    CULTURE, BLOOD (ROUTINE X 2)     Status: None   Collection Time    12/06/13  9:18 AM      Result Value Ref Range   Specimen Description LEFT ANTECUBITAL     Special Requests BOTTLES DRAWN AEROBIC AND ANAEROBIC 16 CC     Culture NO GROWTH 1 DAY     Report Status PENDING    GLUCOSE, CAPILLARY     Status: Abnormal   Collection Time    12/06/13 12:00 PM      Result Value Ref Range   Glucose-Capillary 203 (*) 70 - 99 mg/dL   Comment 1 Notify RN    GLUCOSE, CAPILLARY     Status: Abnormal   Collection Time    12/06/13  4:38 PM      Result Value Ref Range   Glucose-Capillary 180 (*) 70 - 99 mg/dL   Comment 1 Notify RN    GLUCOSE, CAPILLARY     Status:  Abnormal   Collection Time    12/06/13  9:45 PM      Result Value Ref Range   Glucose-Capillary 142 (*) 70 - 99 mg/dL  CBC     Status: Abnormal   Collection Time    12/07/13  5:31 AM      Result Value Ref Range   WBC 4.6  4.0 - 10.5 K/uL   RBC 3.27 (*) 4.22 - 5.81 MIL/uL   Hemoglobin 10.4 (*) 13.0 - 17.0 g/dL   Comment: DELTA CHECK NOTED     RESULT REPEATED AND VERIFIED   HCT 31.5 (*) 39.0 - 52.0 %   MCV 96.3  78.0 - 100.0 fL   MCH 31.8  26.0 - 34.0 pg   MCHC 33.0  30.0 - 36.0 g/dL   RDW 15.8 (*) 11.5 - 15.5 %   Platelets 219  150 - 400 K/uL  COMPREHENSIVE METABOLIC PANEL     Status: Abnormal   Collection Time    12/07/13  5:31 AM      Result Value Ref Range   Sodium 142  137 - 147 mEq/L   Potassium 3.5 (*)  3.7 - 5.3 mEq/L   Chloride 103  96 - 112 mEq/L   Comment: DELTA CHECK NOTED   CO2 28  19 - 32 mEq/L   Glucose, Bld 109 (*) 70 - 99 mg/dL   BUN 61 (*) 6 - 23 mg/dL   Creatinine, Ser 2.11 (*) 0.50 - 1.35 mg/dL   Calcium 9.6  8.4 - 10.5 mg/dL   Total Protein 6.4  6.0 - 8.3 g/dL   Albumin 2.6 (*) 3.5 - 5.2 g/dL   AST 19  0 - 37 U/L   ALT 9  0 - 53 U/L   Alkaline Phosphatase 22 (*) 39 - 117 U/L   Total Bilirubin 0.3  0.3 - 1.2 mg/dL   GFR calc non Af Amer 28 (*) >90 mL/min   GFR calc Af Amer 33 (*) >90 mL/min   Comment: (NOTE)     The eGFR has been calculated using the CKD EPI equation.     This calculation has not been validated in all clinical situations.     eGFR's persistently <90 mL/min signify possible Chronic Kidney     Disease.   Anion gap 11  5 - 15  GLUCOSE, CAPILLARY     Status: Abnormal   Collection Time    12/07/13  7:48 AM      Result Value Ref Range   Glucose-Capillary 114 (*) 70 - 99 mg/dL   Comment 1 Notify RN    GLUCOSE, CAPILLARY     Status: Abnormal   Collection Time    12/07/13 11:35 AM      Result Value Ref Range   Glucose-Capillary 117 (*) 70 - 99 mg/dL  GLUCOSE, CAPILLARY     Status: Abnormal   Collection Time    12/07/13  4:32 PM       Result Value Ref Range   Glucose-Capillary 102 (*) 70 - 99 mg/dL  GLUCOSE, CAPILLARY     Status: Abnormal   Collection Time    12/07/13  8:44 PM      Result Value Ref Range   Glucose-Capillary 131 (*) 70 - 99 mg/dL  GLUCOSE, CAPILLARY     Status: None   Collection Time    12/08/13  7:36 AM      Result Value Ref Range   Glucose-Capillary 96  70 - 99 mg/dL    ABGS No results found for this basename: PHART, PCO2, PO2ART, TCO2, HCO3,  in the last 72 hours CULTURES Recent Results (from the past 240 hour(s))  CULTURE, BLOOD (ROUTINE X 2)     Status: None   Collection Time    12/06/13  9:18 AM      Result Value Ref Range Status   Specimen Description BLOOD RIGHT ANTECUBITAL 10CC   Final   Special Requests     Final   Value: BOTTLES DRAWN AEROBIC ONLY     Performed at [place]   Culture NO GROWTH 1 DAY   Final   Report Status PENDING   Incomplete  CULTURE, BLOOD (ROUTINE X 2)     Status: None   Collection Time    12/06/13  9:18 AM      Result Value Ref Range Status   Specimen Description LEFT ANTECUBITAL   Final   Special Requests BOTTLES DRAWN AEROBIC AND ANAEROBIC 16 CC   Final   Culture NO GROWTH 1 DAY   Final   Report Status PENDING   Incomplete   Studies/Results: Ct Head Wo Contrast  12/06/2013  CLINICAL DATA:  Altered mental status  EXAM: CT HEAD WITHOUT CONTRAST  TECHNIQUE: Contiguous axial images were obtained from the base of the skull through the vertex without intravenous contrast.  COMPARISON:  11/11/2013  FINDINGS: No evidence of parenchymal hemorrhage or extra-axial fluid collection. No mass lesion, mass effect, or midline shift.  No CT evidence of acute infarction.  Old left MCA distribution infarct.  Subcortical white matter and periventricular small vessel ischemic changes. Intracranial atherosclerosis.  Global cortical atrophy.  No ventriculomegaly.  The visualized paranasal sinuses are essentially clear. The mastoid air cells are unopacified.   No evidence of calvarial fracture.  IMPRESSION: No evidence of acute intracranial abnormality.  Old MCA distribution infarct.  Atrophy with small vessel ischemic changes and intracranial atherosclerosis.   Electronically Signed   By: Julian Hy M.D.   On: 12/06/2013 10:16    Medications:  Prior to Admission:  Prescriptions prior to admission  Medication Sig Dispense Refill  . allopurinol (ZYLOPRIM) 300 MG tablet Take 300 mg by mouth every morning.       Marland Kitchen ALPRAZolam (XANAX) 1 MG tablet Take 1 mg by mouth 3 (three) times daily as needed for anxiety.      Marland Kitchen amiodarone (PACERONE) 200 MG tablet Take 1 tablet (200 mg total) by mouth daily.  30 tablet  12  . amLODipine (NORVASC) 5 MG tablet Take 5 mg by mouth every morning.       Marland Kitchen atorvastatin (LIPITOR) 80 MG tablet Take 40 mg by mouth at bedtime.       . carvedilol (COREG) 25 MG tablet Take 25 mg by mouth 2 (two) times daily with a meal.      . dipyridamole-aspirin (AGGRENOX) 25-200 MG per 12 hr capsule Take 1 capsule by mouth 2 (two) times daily.      . fenofibrate 160 MG tablet Take 160 mg by mouth every morning.       Marland Kitchen glimepiride (AMARYL) 4 MG tablet Take 4 mg by mouth 2 (two) times daily with a meal.       . hydrALAZINE (APRESOLINE) 50 MG tablet Take 50 mg by mouth 4 (four) times daily.      Marland Kitchen HYDROcodone-acetaminophen (NORCO/VICODIN) 5-325 MG per tablet Take 1 tablet by mouth every 6 (six) hours as needed for pain.      Marland Kitchen insulin glargine (LANTUS) 100 UNIT/ML injection Inject 25 Units into the skin at bedtime. If sugar is too low none is given      . isosorbide mononitrate (IMDUR) 30 MG 24 hr tablet Take 1 tablet (30 mg total) by mouth daily.  30 tablet  12  . levETIRAcetam (KEPPRA) 500 MG tablet Take 500 mg by mouth 2 (two) times daily.      Marland Kitchen levothyroxine (SYNTHROID, LEVOTHROID) 200 MCG tablet Takes 221mg tablet and 25 mcg tablet for total dose of 2246m      . levothyroxine (SYNTHROID, LEVOTHROID) 25 MCG tablet Takes 20033m tablet and 25 mcg tablet for total dose of 225m82m    . LINZESS 145 MCG CAPS capsule Take 145 mcg by mouth daily.      . loMarland Kitchenartan (COZAAR) 25 MG tablet Take 25 mg by mouth daily.      . metolazone (ZAROXOLYN) 5 MG tablet Take 5 mg by mouth 2 (two) times daily as needed. Swelling      . phenytoin (DILANTIN) 100 MG ER capsule Take 100-300 mg by mouth 2 (two) times daily. Patient take 100 in the morning  and 300 at night      . potassium chloride (K-DUR) 10 MEQ tablet Take 20 mEq by mouth 3 (three) times daily.       . promethazine (PHENERGAN) 25 MG tablet Take 12.5-25 tablets by mouth daily as needed for nausea.       Marland Kitchen spironolactone (ALDACTONE) 25 MG tablet Take 12.5 mg by mouth daily.      . Tamsulosin HCl (FLOMAX) 0.4 MG CAPS Take 0.4 mg by mouth 2 (two) times daily.      Marland Kitchen torsemide (DEMADEX) 20 MG tablet Take 1 tablet (20 mg total) by mouth 2 (two) times daily.  60 tablet  12  . [DISCONTINUED] phenytoin (DILANTIN) 100 MG ER capsule Take 3 capsules (300 mg total) by mouth at bedtime. Patient take 100 in the morning and 300 at night  90 capsule  12  . ciprofloxacin (CILOXAN) 0.3 % ophthalmic solution Place 1 drop into both eyes as needed (Only uses as needed for flares).       . nitroGLYCERIN (NITROSTAT) 0.4 MG SL tablet Place 0.4 mg under the tongue every 5 (five) minutes x 3 doses as needed for chest pain.       Scheduled: . allopurinol  300 mg Oral q morning - 10a  . amiodarone  200 mg Oral Daily  . amLODipine  5 mg Oral q morning - 10a  . atorvastatin  40 mg Oral QHS  . carvedilol  25 mg Oral BID WC  . dipyridamole-aspirin  1 capsule Oral BID  . docusate sodium  100 mg Oral BID  . enoxaparin (LOVENOX) injection  30 mg Subcutaneous Daily  . feeding supplement (RESOURCE BREEZE)  1 Container Oral TID BM  . fenofibrate  160 mg Oral q morning - 10a  . hydrALAZINE  50 mg Oral QID  . insulin aspart  0-9 Units Subcutaneous TID WC  . insulin glargine  25 Units Subcutaneous QHS  . isosorbide  mononitrate  30 mg Oral Daily  . levETIRAcetam  500 mg Oral BID  . levofloxacin (LEVAQUIN) IV  750 mg Intravenous Q48H  . levothyroxine  200 mcg Oral QAC breakfast  . levothyroxine  25 mcg Oral QAC breakfast  . losartan  25 mg Oral Daily  . phenytoin  100 mg Oral q morning - 10a  . phenytoin  300 mg Oral QHS  . senna  1 tablet Oral BID  . sodium chloride  3 mL Intravenous Q12H  . spironolactone  12.5 mg Oral Daily  . tamsulosin  0.4 mg Oral BID   Continuous: . sodium chloride 75 mL/hr at 12/08/13 0133   IHW:[id number], acetaminophen, LORazepam, sodium chloride  Assesment: He was admitted with delirium which appears to be related to acute urinary retention. He has BPH with urinary obstruction. He has multiple other medical problems. He has seizure disorder but certainly nothing that was obviously a seizure before his admission. He has improved with Foley catheter placement and antibiotics urine culture is still pending Principal Problem:   Delirium Active Problems:   HYPOTHYROIDISM   HYPERTENSION   Diabetes mellitus, type 2   Ischemic heart disease   Dementia with behavioral disturbance   Chronic hypoxemic respiratory failure   Dehydration   Seizure disorder   Chronic combined systolic and diastolic congestive heart failure   Acute urinary retention   COPD (chronic obstructive pulmonary disease)    Plan: Continue with current treatments for now. He may be a go home in the next 48 hours but I  think he's going to require a Foley catheter at home and he'll he can have urology consultation    LOS: 2 days   Mixtli Reno L 12/08/2013, 9:03 AM

## 2013-12-08 NOTE — Clinical Social Work Note (Signed)
Daughter refusing SNF, states she wants to take patient home at discharge, wants home health services.  CSW signing off as no further SW needs identified.  Santa GeneraAnne Cunningham, LCSW Clinical Social Worker [phone])

## 2013-12-08 NOTE — Clinical Documentation Improvement (Signed)
"  He was admitted with delirium which may have been related to acute urinary retention" documented in 8/3 progress note.  Please clarify the likely etiology of the patient's urinary retention and document findings in next Progress Note and carry over to Discharge Summary.   BPH with urinary obstruction  Stricture  Renal Calculi  UTI  Other Condition  Thank You, Shellee MiloEileen T Micajah Dennin ,RN Clinical Documentation Specialist:  [phone]  [facility] Information Management

## 2013-12-09 LAB — GLUCOSE, CAPILLARY
Glucose-Capillary: 110 mg/dL — ABNORMAL HIGH (ref 70–99)
Glucose-Capillary: 115 mg/dL — ABNORMAL HIGH (ref 70–99)
Glucose-Capillary: 146 mg/dL — ABNORMAL HIGH (ref 70–99)
Glucose-Capillary: 140 mg/dL — ABNORMAL HIGH (ref 70–99)

## 2013-12-09 LAB — URINE CULTURE

## 2013-12-09 NOTE — Progress Notes (Signed)
PT Cancellation Note  Patient Details Name: Marlyce Hugeron A Scheff MRN: [id number] DOB: 1933/11/29   Cancelled Treatment:    Reason Eval/Treat Not Completed: Patient declined, no reason specified  Attempted PT treatment.  Pt asleep prior to treatment, though easily aroused.  Family present for attempted treatment session.  Attempted therapeutic exercises in bed, though pt stating "hell no" when asked to complete exercises.  Asked pt if PROM to prevent muscle tightness/stiffness and contractures was allowed, and pt continued to state "hell no" and telling PT to "get away".  Covered legs back up with blanket and transitioned treatment session to UE.  Pt resisted all attempts at ROM of UE, stating "no" to active and passive ROM.  Family reports pt is like this and irritable when being awoken.  Will re-attempt PT when pt awake and cooperative with treatment.       Deyonte Cadden 12/09/2013, 10:51 AM

## 2013-12-09 NOTE — Progress Notes (Signed)
Subjective: Feels better. He has no new complaints.  Objective: Vital signs in last 24 hours: Temp:  [98.2 F (36.8 C)-98.3 F (36.8 C)] 98.2 F (36.8 C) (08/04 2140) Pulse Rate:  [53-65] 53 (08/04 2140) Resp:  [14-20] 14 (08/04 2140) BP: (106-123)/(31-40) 106/31 mmHg (08/04 2140) SpO2:  [99 %-100 %] 99 % (08/04 2140) Weight change:  Last BM Date: 12/06/13  Intake/Output from previous day: 08/04 0701 - 08/05 0700 In: 1360 [P.O.:360; I.V.:850; IV Piggyback:150] Out: 550 [Urine:550]  PHYSICAL EXAM General appearance: alert, cooperative and no distress Resp: clear to auscultation bilaterally Cardio: He has a mildly irregular heart rate. I don't hear a gallop GI: soft, non-tender; bowel sounds normal; no masses,  no organomegaly Extremities: extremities normal, atraumatic, no cyanosis or edema  Lab Results:  Results for orders placed during the hospital encounter of 12/06/13 (from the past 48 hour(s))  GLUCOSE, CAPILLARY     Status: Abnormal   Collection Time    12/07/13 11:35 AM      Result Value Ref Range   Glucose-Capillary 117 (*) 70 - 99 mg/dL  GLUCOSE, CAPILLARY     Status: Abnormal   Collection Time    12/07/13  4:32 PM      Result Value Ref Range   Glucose-Capillary 102 (*) 70 - 99 mg/dL  GLUCOSE, CAPILLARY     Status: Abnormal   Collection Time    12/07/13  8:44 PM      Result Value Ref Range   Glucose-Capillary 131 (*) 70 - 99 mg/dL  GLUCOSE, CAPILLARY     Status: None   Collection Time    12/08/13  7:36 AM      Result Value Ref Range   Glucose-Capillary 96  70 - 99 mg/dL  GLUCOSE, CAPILLARY     Status: None   Collection Time    12/08/13 11:25 AM      Result Value Ref Range   Glucose-Capillary 91  70 - 99 mg/dL  GLUCOSE, CAPILLARY     Status: Abnormal   Collection Time    12/08/13  4:55 PM      Result Value Ref Range   Glucose-Capillary 146 (*) 70 - 99 mg/dL  GLUCOSE, CAPILLARY     Status: Abnormal   Collection Time    12/08/13  9:38 PM       Result Value Ref Range   Glucose-Capillary 143 (*) 70 - 99 mg/dL   Comment 1 Notify RN     Comment 2 Documented in Chart    GLUCOSE, CAPILLARY     Status: Abnormal   Collection Time    12/09/13  8:01 AM      Result Value Ref Range   Glucose-Capillary 110 (*) 70 - 99 mg/dL   Comment 1 Notify RN      ABGS No results found for this basename: PHART, PCO2, PO2ART, TCO2, HCO3,  in the last 72 hours CULTURES Recent Results (from the past 240 hour(s))  CULTURE, BLOOD (ROUTINE X 2)     Status: None   Collection Time    12/06/13  9:18 AM      Result Value Ref Range Status   Specimen Description BLOOD RIGHT ANTECUBITAL   Final   Special Requests BOTTLES DRAWN AEROBIC ONLY 10CC   Final   Culture NO GROWTH 2 DAYS   Final   Report Status PENDING   Incomplete  CULTURE, BLOOD (ROUTINE X 2)     Status: None   Collection Time  12/06/13  9:18 AM      Result Value Ref Range Status   Specimen Description BLOOD LEFT ANTECUBITAL   Final   Special Requests BOTTLES DRAWN AEROBIC AND ANAEROBIC 16CC   Final   Culture NO GROWTH 2 DAYS   Final   Report Status PENDING   Incomplete  URINE CULTURE     Status: None   Collection Time    12/06/13  6:00 PM      Result Value Ref Range Status   Specimen Description URINE, CATHETERIZED   Final   Special Requests NONE   Final   Culture  Setup Time     Final   Value: 12/07/2013 13:28     Performed at [facility] Count     Final   Value: 30,000 COLONIES/ML     Performed at [facility]   Culture     Final   Value: ENTEROCOCCUS SPECIES     Performed at [facility]   Report Status PENDING   Incomplete   Studies/Results: No results found.  Medications:  Prior to Admission:  Prescriptions prior to admission  Medication Sig Dispense Refill  . allopurinol (ZYLOPRIM) 300 MG tablet Take 300 mg by mouth every morning.       Marland Kitchen ALPRAZolam (XANAX) 1 MG tablet Take 1 mg by mouth 3 (three) times daily as needed for anxiety.       Marland Kitchen amiodarone (PACERONE) 200 MG tablet Take 1 tablet (200 mg total) by mouth daily.  30 tablet  12  . amLODipine (NORVASC) 5 MG tablet Take 5 mg by mouth every morning.       Marland Kitchen atorvastatin (LIPITOR) 80 MG tablet Take 40 mg by mouth at bedtime.       . carvedilol (COREG) 25 MG tablet Take 25 mg by mouth 2 (two) times daily with a meal.      . dipyridamole-aspirin (AGGRENOX) 25-200 MG per 12 hr capsule Take 1 capsule by mouth 2 (two) times daily.      . fenofibrate 160 MG tablet Take 160 mg by mouth every morning.       Marland Kitchen glimepiride (AMARYL) 4 MG tablet Take 4 mg by mouth 2 (two) times daily with a meal.       . hydrALAZINE (APRESOLINE) 50 MG tablet Take 50 mg by mouth 4 (four) times daily.      Marland Kitchen HYDROcodone-acetaminophen (NORCO/VICODIN) 5-325 MG per tablet Take 1 tablet by mouth every 6 (six) hours as needed for pain.      Marland Kitchen insulin glargine (LANTUS) 100 UNIT/ML injection Inject 25 Units into the skin at bedtime. If sugar is too low none is given      . isosorbide mononitrate (IMDUR) 30 MG 24 hr tablet Take 1 tablet (30 mg total) by mouth daily.  30 tablet  12  . levETIRAcetam (KEPPRA) 500 MG tablet Take 500 mg by mouth 2 (two) times daily.      Marland Kitchen levothyroxine (SYNTHROID, LEVOTHROID) 200 MCG tablet Takes tablet and 25 mcg tablet for total dose of      . levothyroxine (SYNTHROID, LEVOTHROID) 25 MCG tablet Takes tablet and 25 mcg tablet for total dose of      . LINZESS 145 MCG CAPS capsule Take 145 mcg by mouth daily.      Marland Kitchen losartan (COZAAR) 25 MG tablet Take 25 mg by mouth daily.      . metolazone (ZAROXOLYN) 5 MG tablet Take  5 mg by mouth 2 (two) times daily as needed. Swelling      . phenytoin (DILANTIN) 100 MG ER capsule Take 100-300 mg by mouth 2 (two) times daily. Patient take 100 in the morning and 300 at night      . potassium chloride (K-DUR) 10 MEQ tablet Take 20 mEq by mouth 3 (three) times daily.       . promethazine (PHENERGAN) 25 MG tablet Take 12.5-25  tablets by mouth daily as needed for nausea.       Marland Kitchen spironolactone (ALDACTONE) 25 MG tablet Take 12.5 mg by mouth daily.      . Tamsulosin HCl (FLOMAX) 0.4 MG CAPS Take 0.4 mg by mouth 2 (two) times daily.      Marland Kitchen torsemide (DEMADEX) 20 MG tablet Take 1 tablet (20 mg total) by mouth 2 (two) times daily.  60 tablet  12  . [DISCONTINUED] phenytoin (DILANTIN) 100 MG ER capsule Take 3 capsules (300 mg total) by mouth at bedtime. Patient take 100 in the morning and 300 at night  90 capsule  12  . ciprofloxacin (CILOXAN) 0.3 % ophthalmic solution Place 1 drop into both eyes as needed (Only uses as needed for flares).       . nitroGLYCERIN (NITROSTAT) 0.4 MG SL tablet Place 0.4 mg under the tongue every 5 (five) minutes x 3 doses as needed for chest pain.        Assesment: He was admitted with delirium but I think was related to acute urinary retention. We're awaiting the results of his urine culture. He will need outpatient urology evaluation Principal Problem:   Delirium Active Problems:   HYPOTHYROIDISM   HYPERTENSION   Diabetes mellitus, type 2   Ischemic heart disease   Dementia with behavioral disturbance   Chronic hypoxemic respiratory failure   Dehydration   Seizure disorder   Chronic combined systolic and diastolic congestive heart failure   Acute urinary retention   COPD (chronic obstructive pulmonary disease)    Plan: Probable discharge tomorrow. I would like to have final results of his urine culture before he goes home to be sure he goes home on the appropriate antibiotic. He is improving.    LOS: 3 days   Donald Owen L 12/09/2013, 9:07 AM

## 2013-12-09 NOTE — Clinical Social Work Note (Signed)
CSW signing off, family refusing SNF placement, no SW needs identified.  Please consult if desired.  Santa GeneraAnne Cunningham, LCSW Clinical Social Worker [phone])

## 2013-12-10 LAB — BASIC METABOLIC PANEL
Anion gap: 9 (ref 5–15)
BUN: 30 mg/dL — ABNORMAL HIGH (ref 6–23)
CALCIUM: 9.6 mg/dL (ref 8.4–10.5)
CO2: 25 mEq/L (ref 19–32)
Chloride: 111 mEq/L (ref 96–112)
Creatinine, Ser: 1.24 mg/dL (ref 0.50–1.35)
GFR calc Af Amer: 62 mL/min — ABNORMAL LOW (ref >90)
GFR calc non Af Amer: 54 mL/min — ABNORMAL LOW (ref >90)
GLUCOSE: 116 mg/dL — AB (ref 70–99)
Potassium: 3.6 mEq/L — ABNORMAL LOW (ref 3.7–5.3)
SODIUM: 145 meq/L (ref 137–147)

## 2013-12-10 LAB — GLUCOSE, CAPILLARY: Glucose-Capillary: 97 mg/dL (ref 70–99)

## 2013-12-10 MED ORDER — LEVOFLOXACIN 500 MG PO TABS: 500.0000 mg | ORAL_TABLET | Freq: Every day | ORAL | Status: DC

## 2013-12-10 NOTE — Discharge Summary (Signed)
Physician Discharge Summary  Patient ID: Donald Owen MRN: [id number] DOB/AGE: 1933-11-18 80 y.o. Primary Care Physician:Raeven Pint L, MD Admit date: 12/06/2013 Discharge date: 12/10/2013    Discharge Diagnoses:   Principal Problem:   Delirium Active Problems:   HYPOTHYROIDISM   HYPERTENSION   Diabetes mellitus, type 2   Ischemic heart disease   Dementia with behavioral disturbance   Chronic hypoxemic respiratory failure   Dehydration   Seizure disorder   Chronic combined systolic and diastolic congestive heart failure   Acute urinary retention   COPD (chronic obstructive pulmonary disease)  enterococcus urinary tract infection    Medication List         AGGRENOX 200-25 MG per 12 hr capsule  Generic drug:  dipyridamole-aspirin  Take 1 capsule by mouth 2 (two) times daily.     allopurinol 300 MG tablet  Commonly known as:  ZYLOPRIM  Take 300 mg by mouth every morning.     ALPRAZolam 1 MG tablet  Commonly known as:  XANAX  Take 1 mg by mouth 3 (three) times daily as needed for anxiety.     amiodarone 200 MG tablet  Commonly known as:  PACERONE  Take 1 tablet (200 mg total) by mouth daily.     amLODipine 5 MG tablet  Commonly known as:  NORVASC  Take 5 mg by mouth every morning.     atorvastatin 80 MG tablet  Commonly known as:  LIPITOR  Take 40 mg by mouth at bedtime.     carvedilol 25 MG tablet  Commonly known as:  COREG  Take 25 mg by mouth 2 (two) times daily with a meal.     ciprofloxacin 0.3 % ophthalmic solution  Commonly known as:  CILOXAN  Place 1 drop into both eyes as needed (Only uses as needed for flares).     fenofibrate 160 MG tablet  Take 160 mg by mouth every morning.     glimepiride 4 MG tablet  Commonly known as:  AMARYL  Take 4 mg by mouth 2 (two) times daily with a meal.     hydrALAZINE 50 MG tablet  Commonly known as:  APRESOLINE  Take 50 mg by mouth 4 (four) times daily.     HYDROcodone-acetaminophen 5-325 MG per tablet   Commonly known as:  NORCO/VICODIN  Take 1 tablet by mouth every 6 (six) hours as needed for pain.     insulin glargine 100 UNIT/ML injection  Commonly known as:  LANTUS  Inject 25 Units into the skin at bedtime. If sugar is too low none is given     isosorbide mononitrate 30 MG 24 hr tablet  Commonly known as:  IMDUR  Take 1 tablet (30 mg total) by mouth daily.     levETIRAcetam 500 MG tablet  Commonly known as:  KEPPRA  Take 500 mg by mouth 2 (two) times daily.     levofloxacin 500 MG tablet  Commonly known as:  LEVAQUIN  Take 1 tablet (500 mg total) by mouth daily.     levothyroxine 200 MCG tablet  Commonly known as:  SYNTHROID, LEVOTHROID  Takes tablet and 25 mcg tablet for total dose of     levothyroxine 25 MCG tablet  Commonly known as:  SYNTHROID, LEVOTHROID  Takes tablet and 25 mcg tablet for total dose of     LINZESS 145 MCG Caps capsule  Generic drug:  Linaclotide  Take 145 mcg by mouth daily.     losartan 25  MG tablet  Commonly known as:  COZAAR  Take 25 mg by mouth daily.     metolazone 5 MG tablet  Commonly known as:  ZAROXOLYN  Take 5 mg by mouth 2 (two) times daily as needed. Swelling     nitroGLYCERIN 0.4 MG SL tablet  Commonly known as:  NITROSTAT  Place 0.4 mg under the tongue every 5 (five) minutes x 3 doses as needed for chest pain.     phenytoin 100 MG ER capsule  Commonly known as:  DILANTIN  Take 100-300 mg by mouth 2 (two) times daily. Patient take 100 in the morning and 300 at night     potassium chloride 10 MEQ tablet  Commonly known as:  K-DUR  Take 20 mEq by mouth 3 (three) times daily.     promethazine 25 MG tablet  Commonly known as:  PHENERGAN  Take 12.5-25 tablets by mouth daily as needed for nausea.     spironolactone 25 MG tablet  Commonly known as:  ALDACTONE  Take 12.5 mg by mouth daily.     tamsulosin 0.4 MG Caps capsule  Commonly known as:  FLOMAX  Take 0.4 mg by mouth 2 (two) times daily.      torsemide 20 MG tablet  Commonly known as:  DEMADEX  Take 1 tablet (20 mg total) by mouth 2 (two) times daily.        Discharged Condition: Improved    Consults: None  Significant Diagnostic Studies: Ct Head Wo Contrast  12/06/2013   CLINICAL DATA:  Altered mental status  EXAM: CT HEAD WITHOUT CONTRAST  TECHNIQUE: Contiguous axial images were obtained from the base of the skull through the vertex without intravenous contrast.  COMPARISON:  11/11/2013  FINDINGS: No evidence of parenchymal hemorrhage or extra-axial fluid collection. No mass lesion, mass effect, or midline shift.  No CT evidence of acute infarction.  Old left MCA distribution infarct.  Subcortical white matter and periventricular small vessel ischemic changes. Intracranial atherosclerosis.  Global cortical atrophy.  No ventriculomegaly.  The visualized paranasal sinuses are essentially clear. The mastoid air cells are unopacified.  No evidence of calvarial fracture.  IMPRESSION: No evidence of acute intracranial abnormality.  Old MCA distribution infarct.  Atrophy with small vessel ischemic changes and intracranial atherosclerosis.   Electronically Signed   By: Charline Bills M.D.   On: 12/06/2013 10:16   Ct Head Wo Contrast  11/11/2013   CLINICAL DATA:  Altered mental status  EXAM: CT HEAD WITHOUT CONTRAST  TECHNIQUE: Contiguous axial images were obtained from the base of the skull through the vertex without intravenous contrast.  COMPARISON:  None.  FINDINGS: Large old infarct with encephalomalacia with in the left frontal lobe. No acute infarction. No hemorrhage. No mass effect or midline shift. No hydrocephalus.  No acute calvarial abnormality.  Visualized paranasal sinuses and mastoids clear. Orbital soft tissues unremarkable.  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  No acute intracranial abnormality.   Electronically Signed   By: Charlett Nose M.D.   On: 11/11/2013 15:07   Dg Chest Portable 1  View  12/06/2013   CLINICAL DATA:  Altered mental status.  EXAM: PORTABLE CHEST - 1 VIEW  COMPARISON:  11/17/2013  FINDINGS: Postoperative changes in the mediastinum. Shallow inspiration. Mild cardiac enlargement and pulmonary vascular congestion. Slight interstitial edema. Improvement since previous study. No focal consolidation. No pneumothorax. No blunting of costophrenic angles.  IMPRESSION: Cardiac enlargement with vascular congestion and mild interstitial edema, improving since previous  study.   Electronically Signed   By: Burman Nieves M.D.   On: 12/06/2013 04:58   Dg Chest Port 1 View  11/17/2013   CLINICAL DATA:  Thick sputum for 1 day, history CHF, hypertension, diabetes, stroke, ischemic cardiomyopathy  EXAM: PORTABLE CHEST - 1 VIEW  COMPARISON:  Portable exam 1624 hr compared to 11/11/2013  FINDINGS: Light technique limits exam.  Enlargement of cardiac silhouette post CABG.  Pulmonary vascular congestion.  Mild interstitial infiltrates compatible pulmonary edema and CHF.  Questionable small LEFT pleural effusion.  No gross pneumothorax or osseous findings.  IMPRESSION: Mild CHF.   Electronically Signed   By: Ulyses Southward M.D.   On: 11/17/2013 16:41   Dg Chest Port 1 View  11/11/2013   CLINICAL DATA:  Shortness of breath and chest pain  EXAM: PORTABLE CHEST - 1 VIEW  COMPARISON:  August 22, 2013  FINDINGS: There is interstitial edema throughout the lungs bilaterally. There is cardiomegaly with pulmonary venous hypertension. There is no airspace consolidation. No adenopathy. Patient is status post median sternotomy.  IMPRESSION: Congestive heart failure.  No airspace consolidation.   Electronically Signed   By: Bretta Bang M.D.   On: 11/11/2013 14:05   Dg Abd 2 Views  12/06/2013   CLINICAL DATA:  Altered mental status.  Abdominal pain.  EXAM: ABDOMEN - 2 VIEW  COMPARISON:  CT abdomen and pelvis 10/21/2009.  Abdomen 04/26/2009.  FINDINGS: The bowel gas pattern is normal. There is no evidence  of free air. Vascular calcifications. No radio-opaque calculi or other significant radiographic abnormality is seen.  IMPRESSION: Nonobstructive bowel gas pattern.   Electronically Signed   By: Burman Nieves M.D.   On: 12/06/2013 06:43    Lab Results: Basic Metabolic Panel:  Recent Labs  16/10/96 0541  NA 145  K 3.6*  CL 111  CO2 25  GLUCOSE 116*  BUN 30*  CREATININE 1.24  CALCIUM 9.6   Liver Function Tests: No results found for this basename: AST, ALT, ALKPHOS, BILITOT, PROT, ALBUMIN,  in the last 72 hours   CBC: No results found for this basename: WBC, NEUTROABS, HGB, HCT, MCV, PLT,  in the last 72 hours  Recent Results (from the past 240 hour(s))  CULTURE, BLOOD (ROUTINE X 2)     Status: None   Collection Time    12/06/13  9:18 AM      Result Value Ref Range Status   Specimen Description BLOOD RIGHT ANTECUBITAL   Final   Special Requests BOTTLES DRAWN AEROBIC ONLY 10CC   Final   Culture NO GROWTH 3 DAYS   Final   Report Status PENDING   Incomplete  CULTURE, BLOOD (ROUTINE X 2)     Status: None   Collection Time    12/06/13  9:18 AM      Result Value Ref Range Status   Specimen Description BLOOD LEFT ANTECUBITAL   Final   Special Requests BOTTLES DRAWN AEROBIC AND ANAEROBIC 16CC   Final   Culture NO GROWTH 3 DAYS   Final   Report Status PENDING   Incomplete  URINE CULTURE     Status: None   Collection Time    12/06/13  6:00 PM      Result Value Ref Range Status   Specimen Description URINE, CATHETERIZED   Final   Special Requests NONE   Final   Culture  Setup Time     Final   Value: 12/07/2013 13:28     Performed at [facility]   Colony Count     Final   Value: 30,000 COLONIES/ML     Performed at [facility]   Culture     Final   Value: ENTEROCOCCUS SPECIES     Performed at [facility]   Report Status 12/09/2013 FINAL   Final   Organism ID, Bacteria ENTEROCOCCUS SPECIES   Final     Hospital Course: This is a 80 year old who  has baseline dementia and who developed increasing problems with altered mental status. He was brought to the emergency department and was found to be confused and was thought to have a urinary tract infection. He was started on Levaquin. Urine culture grew enterococcus. Bladder scanning showed that he had acute urinary retention and Foley catheter was placed. Since we do not have inpatient urology evaluation unavailable he will be discharged home with Foley catheter. His mental status improved and he was back at baseline at the time of discharge  Discharge Exam: Blood pressure 138/59, pulse 67, temperature 98.6 F (37 C), temperature source Oral, resp. rate 20, height 5\' 7"  (1.702 m), weight 81.4 kg (179 lb 7.3 oz), SpO2 98.00%. He is awake and alert. He is mildly confused. His chest is clear. He has a Foley catheter in place  Disposition: Home with home health services and he will have an outpatient urology evaluation.      Discharge Instructions   Discharge patient    Complete by:  As directed      Face-to-face encounter (required for Medicare/Medicaid patients)    Complete by:  As directed   I Mekesha Solomon L certify that this patient is under my care and that I, or a nurse practitioner or physician's assistant working with me, had a face-to-face encounter that meets the physician face-to-face encounter requirements with this patient on 12/10/2013. The encounter with the patient was in whole, or in part for the following medical condition(s) which is the primary reason for home health care (List medical condition): UTI/acute urinary retention/altered mental status  The encounter with the patient was in whole, or in part, for the following medical condition, which is the primary reason for home health care:  UTI/acute urinary retention/altered mental status  I certify that, based on my findings, the following services are medically necessary home health services:   Nursing Physical therapy     My clinical findings support the need for the above services:  Cognitive impairments, dementia, or mental confusion  that make it unsafe to leave home  Further, I certify that my clinical findings support that this patient is homebound due to:  Mental confusion  Reason for Medically Necessary Home Health Services:  Skilled Nursing- Change/Decline in Patient Status     Home Health    Complete by:  As directed   To provide the following care/treatments:   PT RN             Follow-up Information   Follow up with [facility].   Contact information:   [address] [place] [address] [phone]       Signed: Fredirick MaudlinHAWKINS,Rinda Rollyson L   12/10/2013, 9:04 AM

## 2013-12-10 NOTE — Progress Notes (Signed)
He feels much better. His chest is clear and his heart is regular. He is ready for discharge and will be discharged home today

## 2013-12-10 NOTE — Progress Notes (Signed)
AVS reviewed with patient's daughter.  Verbalized understanding of discharge instructions, physician follow-up, medications, and foley catheter care.  RN contacted Dr. Juanetta GoslingHawkins and verified that patient is to be discharged with the foley catheter.  Dr. Juanetta GoslingHawkins' office will contact patient's daughter with urology referral.  Patient transported by EMS to daughter's home for discharge.  Patient stable at time of discharge.

## 2013-12-10 NOTE — Progress Notes (Signed)
UR review complete.  

## 2013-12-11 LAB — CULTURE, BLOOD (ROUTINE X 2)
CULTURE: NO GROWTH
Culture: NO GROWTH

## 2013-12-14 NOTE — Discharge Summary (Signed)
NAME:  Donald Owen, Donald Owen                 ACCOUNT NO.:  [id number]  MEDICAL RECORD NO.:  [id number]  LOCATION:                                 FACILITY:  PHYSICIAN:  Ndeye Tenorio L. Juanetta GoslingHawkins, M.D.DATE OF BIRTH:  Feb 23, 1934  DATE OF ADMISSION:  12/06/2013 DATE OF DISCHARGE:  08/06/2015LH                              DISCHARGE SUMMARY   ADDENDUM:  Delirium related to acute urinary retention and urinary tract infection.     Janiylah Hannis L. Juanetta GoslingHawkins, M.D.     EL[facility]/MEDQ  D:  12/12/2013  T:  12/12/2013  Job:  [id number]

## 2014-01-25 ENCOUNTER — Inpatient Hospital Stay (HOSPITAL_COMMUNITY)
Admission: EM | Admit: 2014-01-25 | Discharge: 2014-02-02 | DRG: 871 | Disposition: A | Discharge status: Hospice | Payer: Medicare Other | Attending: Pulmonary Disease | Admitting: Pulmonary Disease

## 2014-01-25 ENCOUNTER — Emergency Department (HOSPITAL_COMMUNITY): Payer: Medicare Other

## 2014-01-25 ENCOUNTER — Encounter (HOSPITAL_COMMUNITY): Payer: Self-pay | Admitting: Emergency Medicine

## 2014-01-25 DIAGNOSIS — Z87891 Personal history of nicotine dependence: Secondary | ICD-10-CM

## 2014-01-25 DIAGNOSIS — D649 Anemia, unspecified: Secondary | ICD-10-CM | POA: Diagnosis present

## 2014-01-25 DIAGNOSIS — M109 Gout, unspecified: Secondary | ICD-10-CM | POA: Diagnosis present

## 2014-01-25 DIAGNOSIS — I255 Ischemic cardiomyopathy: Secondary | ICD-10-CM

## 2014-01-25 DIAGNOSIS — E785 Hyperlipidemia, unspecified: Secondary | ICD-10-CM | POA: Diagnosis present

## 2014-01-25 DIAGNOSIS — E876 Hypokalemia: Secondary | ICD-10-CM | POA: Diagnosis present

## 2014-01-25 DIAGNOSIS — R6520 Severe sepsis without septic shock: Secondary | ICD-10-CM

## 2014-01-25 DIAGNOSIS — F03918 Unspecified dementia, unspecified severity, with other behavioral disturbance: Secondary | ICD-10-CM

## 2014-01-25 DIAGNOSIS — Z951 Presence of aortocoronary bypass graft: Secondary | ICD-10-CM | POA: Diagnosis not present

## 2014-01-25 DIAGNOSIS — I5042 Chronic combined systolic (congestive) and diastolic (congestive) heart failure: Secondary | ICD-10-CM | POA: Diagnosis present

## 2014-01-25 DIAGNOSIS — I4949 Other premature depolarization: Secondary | ICD-10-CM | POA: Diagnosis present

## 2014-01-25 DIAGNOSIS — J690 Pneumonitis due to inhalation of food and vomit: Secondary | ICD-10-CM | POA: Diagnosis present

## 2014-01-25 DIAGNOSIS — I2510 Atherosclerotic heart disease of native coronary artery without angina pectoris: Secondary | ICD-10-CM | POA: Diagnosis present

## 2014-01-25 DIAGNOSIS — Z66 Do not resuscitate: Secondary | ICD-10-CM | POA: Diagnosis present

## 2014-01-25 DIAGNOSIS — J9600 Acute respiratory failure, unspecified whether with hypoxia or hypercapnia: Secondary | ICD-10-CM | POA: Diagnosis present

## 2014-01-25 DIAGNOSIS — I2589 Other forms of chronic ischemic heart disease: Secondary | ICD-10-CM | POA: Diagnosis present

## 2014-01-25 DIAGNOSIS — Z515 Encounter for palliative care: Secondary | ICD-10-CM

## 2014-01-25 DIAGNOSIS — Z8249 Family history of ischemic heart disease and other diseases of the circulatory system: Secondary | ICD-10-CM

## 2014-01-25 DIAGNOSIS — F0280 Dementia in other diseases classified elsewhere without behavioral disturbance: Secondary | ICD-10-CM | POA: Diagnosis present

## 2014-01-25 DIAGNOSIS — E46 Unspecified protein-calorie malnutrition: Secondary | ICD-10-CM | POA: Diagnosis present

## 2014-01-25 DIAGNOSIS — I509 Heart failure, unspecified: Secondary | ICD-10-CM | POA: Diagnosis present

## 2014-01-25 DIAGNOSIS — Z794 Long term (current) use of insulin: Secondary | ICD-10-CM

## 2014-01-25 DIAGNOSIS — J189 Pneumonia, unspecified organism: Secondary | ICD-10-CM

## 2014-01-25 DIAGNOSIS — E039 Hypothyroidism, unspecified: Secondary | ICD-10-CM | POA: Diagnosis present

## 2014-01-25 DIAGNOSIS — N184 Chronic kidney disease, stage 4 (severe): Secondary | ICD-10-CM | POA: Diagnosis present

## 2014-01-25 DIAGNOSIS — A419 Sepsis, unspecified organism: Principal | ICD-10-CM

## 2014-01-25 DIAGNOSIS — I252 Old myocardial infarction: Secondary | ICD-10-CM

## 2014-01-25 DIAGNOSIS — I5043 Acute on chronic combined systolic (congestive) and diastolic (congestive) heart failure: Secondary | ICD-10-CM

## 2014-01-25 DIAGNOSIS — F0391 Unspecified dementia with behavioral disturbance: Secondary | ICD-10-CM | POA: Diagnosis present

## 2014-01-25 DIAGNOSIS — N390 Urinary tract infection, site not specified: Secondary | ICD-10-CM

## 2014-01-25 DIAGNOSIS — I129 Hypertensive chronic kidney disease with stage 1 through stage 4 chronic kidney disease, or unspecified chronic kidney disease: Secondary | ICD-10-CM | POA: Diagnosis present

## 2014-01-25 DIAGNOSIS — E870 Hyperosmolality and hypernatremia: Secondary | ICD-10-CM | POA: Diagnosis present

## 2014-01-25 DIAGNOSIS — I872 Venous insufficiency (chronic) (peripheral): Secondary | ICD-10-CM | POA: Diagnosis present

## 2014-01-25 DIAGNOSIS — Z23 Encounter for immunization: Secondary | ICD-10-CM | POA: Diagnosis not present

## 2014-01-25 DIAGNOSIS — E861 Hypovolemia: Secondary | ICD-10-CM | POA: Diagnosis present

## 2014-01-25 DIAGNOSIS — E1129 Type 2 diabetes mellitus with other diabetic kidney complication: Secondary | ICD-10-CM | POA: Diagnosis present

## 2014-01-25 DIAGNOSIS — N170 Acute kidney failure with tubular necrosis: Secondary | ICD-10-CM | POA: Diagnosis present

## 2014-01-25 DIAGNOSIS — J9601 Acute respiratory failure with hypoxia: Secondary | ICD-10-CM

## 2014-01-25 DIAGNOSIS — J9691 Respiratory failure, unspecified with hypoxia: Secondary | ICD-10-CM | POA: Diagnosis present

## 2014-01-25 DIAGNOSIS — I493 Ventricular premature depolarization: Secondary | ICD-10-CM

## 2014-01-25 DIAGNOSIS — I10 Essential (primary) hypertension: Secondary | ICD-10-CM

## 2014-01-25 DIAGNOSIS — R509 Fever, unspecified: Secondary | ICD-10-CM | POA: Diagnosis present

## 2014-01-25 DIAGNOSIS — N179 Acute kidney failure, unspecified: Secondary | ICD-10-CM | POA: Diagnosis present

## 2014-01-25 DIAGNOSIS — N189 Chronic kidney disease, unspecified: Secondary | ICD-10-CM

## 2014-01-25 DIAGNOSIS — Z79899 Other long term (current) drug therapy: Secondary | ICD-10-CM | POA: Diagnosis not present

## 2014-01-25 DIAGNOSIS — E119 Type 2 diabetes mellitus without complications: Secondary | ICD-10-CM | POA: Diagnosis present

## 2014-01-25 DIAGNOSIS — Z8673 Personal history of transient ischemic attack (TIA), and cerebral infarction without residual deficits: Secondary | ICD-10-CM

## 2014-01-25 DIAGNOSIS — G309 Alzheimer's disease, unspecified: Secondary | ICD-10-CM | POA: Diagnosis present

## 2014-01-25 LAB — URINALYSIS, ROUTINE W REFLEX MICROSCOPIC
Glucose, UA: 100 mg/dL — AB
NITRITE: POSITIVE — AB
SPECIFIC GRAVITY, URINE: 1.025 (ref 1.005–1.030)
UROBILINOGEN UA: 4 mg/dL — AB (ref 0.0–1.0)
pH: 5 (ref 5.0–8.0)

## 2014-01-25 LAB — BLOOD GAS, ARTERIAL
ACID-BASE EXCESS: 0 mmol/L (ref 0.0–2.0)
Bicarbonate: 24.4 mEq/L — ABNORMAL HIGH (ref 20.0–24.0)
O2 Content: 4 L/min
O2 SAT: 90.8 %
PCO2 ART: 41.4 mmHg (ref 35.0–45.0)
PO2 ART: 61.8 mmHg — AB (ref 80.0–100.0)
Patient temperature: 37
TCO2: 21.6 mmol/L (ref 0–100)
pH, Arterial: 7.388 (ref 7.350–7.450)

## 2014-01-25 LAB — COMPREHENSIVE METABOLIC PANEL
ALBUMIN: 2.4 g/dL — AB (ref 3.5–5.2)
ALT: 20 U/L (ref 0–53)
AST: 55 U/L — ABNORMAL HIGH (ref 0–37)
Alkaline Phosphatase: 49 U/L (ref 39–117)
Anion gap: 12 (ref 5–15)
BILIRUBIN TOTAL: 2.1 mg/dL — AB (ref 0.3–1.2)
BUN: 56 mg/dL — AB (ref 6–23)
CHLORIDE: 107 meq/L (ref 96–112)
CO2: 27 mEq/L (ref 19–32)
CREATININE: 2.6 mg/dL — AB (ref 0.50–1.35)
Calcium: 10.9 mg/dL — ABNORMAL HIGH (ref 8.4–10.5)
GFR calc non Af Amer: 22 mL/min — ABNORMAL LOW (ref >90)
GFR, EST AFRICAN AMERICAN: 26 mL/min — AB (ref >90)
GLUCOSE: 212 mg/dL — AB (ref 70–99)
Potassium: 5.2 mEq/L (ref 3.7–5.3)
Sodium: 146 mEq/L (ref 137–147)
Total Protein: 7.2 g/dL (ref 6.0–8.3)

## 2014-01-25 LAB — EXPECTORATED SPUTUM ASSESSMENT W REFEX TO RESP CULTURE

## 2014-01-25 LAB — CBC WITH DIFFERENTIAL/PLATELET
Basophils Absolute: 0 10*3/uL (ref 0.0–0.1)
Basophils Relative: 0 % (ref 0–1)
EOS ABS: 0 10*3/uL (ref 0.0–0.7)
Eosinophils Relative: 0 % (ref 0–5)
HEMATOCRIT: 34.4 % — AB (ref 39.0–52.0)
HEMOGLOBIN: 11.4 g/dL — AB (ref 13.0–17.0)
LYMPHS ABS: 0.8 10*3/uL (ref 0.7–4.0)
Lymphocytes Relative: 7 % — ABNORMAL LOW (ref 12–46)
MCH: 31.9 pg (ref 26.0–34.0)
MCHC: 33.1 g/dL (ref 30.0–36.0)
MCV: 96.4 fL (ref 78.0–100.0)
MONO ABS: 0.8 10*3/uL (ref 0.1–1.0)
MONOS PCT: 8 % (ref 3–12)
Neutro Abs: 8.7 10*3/uL — ABNORMAL HIGH (ref 1.7–7.7)
Neutrophils Relative %: 85 % — ABNORMAL HIGH (ref 43–77)
Platelets: 292 10*3/uL (ref 150–400)
RBC: 3.57 MIL/uL — ABNORMAL LOW (ref 4.22–5.81)
RDW: 15 % (ref 11.5–15.5)
WBC: 10.3 10*3/uL (ref 4.0–10.5)

## 2014-01-25 LAB — PHENYTOIN LEVEL, TOTAL: PHENYTOIN LVL: 2.5 ug/mL — AB (ref 10.0–20.0)

## 2014-01-25 LAB — I-STAT CG4 LACTIC ACID, ED: LACTIC ACID, VENOUS: 1.47 mmol/L (ref 0.5–2.2)

## 2014-01-25 LAB — EXPECTORATED SPUTUM ASSESSMENT W GRAM STAIN, RFLX TO RESP C: Special Requests: NORMAL

## 2014-01-25 LAB — GLUCOSE, CAPILLARY
GLUCOSE-CAPILLARY: 181 mg/dL — AB (ref 70–99)
Glucose-Capillary: 253 mg/dL — ABNORMAL HIGH (ref 70–99)

## 2014-01-25 LAB — PROTIME-INR
INR: 1.27 (ref 0.00–1.49)
PROTHROMBIN TIME: 15.9 s — AB (ref 11.6–15.2)

## 2014-01-25 LAB — URINE MICROSCOPIC-ADD ON

## 2014-01-25 LAB — MRSA PCR SCREENING: MRSA by PCR: NEGATIVE

## 2014-01-25 MED ORDER — SODIUM CHLORIDE 0.9 % IV BOLUS (SEPSIS)
500.0000 mL | Freq: Once | INTRAVENOUS | Status: AC
  Administered 2014-01-25: 500 mL via INTRAVENOUS

## 2014-01-25 MED ORDER — HEPARIN SODIUM (PORCINE) 5000 UNIT/ML IJ SOLN
5000.0000 [IU] | Freq: Three times a day (TID) | INTRAMUSCULAR | Status: DC
  Administered 2014-01-25 – 2014-02-02 (×23): 5000 [IU] via SUBCUTANEOUS
  Filled 2014-01-25 (×23): qty 1

## 2014-01-25 MED ORDER — SODIUM CHLORIDE 0.9 % IV SOLN
Freq: Once | INTRAVENOUS | Status: AC
  Administered 2014-01-25: 21:00:00 via INTRAVENOUS

## 2014-01-25 MED ORDER — SODIUM CHLORIDE 0.9 % IV SOLN
INTRAVENOUS | Status: DC
  Administered 2014-01-25 – 2014-01-26 (×3): via INTRAVENOUS

## 2014-01-25 MED ORDER — PIPERACILLIN-TAZOBACTAM 3.375 G IVPB
3.3750 g | Freq: Three times a day (TID) | INTRAVENOUS | Status: DC
  Administered 2014-01-25 – 2014-02-02 (×23): 3.375 g via INTRAVENOUS
  Filled 2014-01-25 (×26): qty 50

## 2014-01-25 MED ORDER — INSULIN ASPART 100 UNIT/ML ~~LOC~~ SOLN: 0.0000 [IU] | Freq: Every day | SUBCUTANEOUS | Status: DC

## 2014-01-25 MED ORDER — SODIUM CHLORIDE 0.9 % IV SOLN
INTRAVENOUS | Status: AC
  Administered 2014-01-25: 17:00:00 via INTRAVENOUS

## 2014-01-25 MED ORDER — PIPERACILLIN-TAZOBACTAM 3.375 G IVPB 30 MIN
3.3750 g | Freq: Once | INTRAVENOUS | Status: AC
  Administered 2014-01-25: 3.375 g via INTRAVENOUS
  Filled 2014-01-25: qty 50

## 2014-01-25 MED ORDER — SODIUM CHLORIDE 0.9 % IV BOLUS (SEPSIS)
500.0000 mL | Freq: Once | INTRAVENOUS | Status: AC
  Administered 2014-01-26: 500 mL via INTRAVENOUS

## 2014-01-25 MED ORDER — SODIUM CHLORIDE 0.9 % IV SOLN
1000.0000 mL | Freq: Once | INTRAVENOUS | Status: AC
  Administered 2014-01-25: 1000 mL via INTRAVENOUS

## 2014-01-25 MED ORDER — VANCOMYCIN HCL IN DEXTROSE 1-5 GM/200ML-% IV SOLN
1000.0000 mg | Freq: Once | INTRAVENOUS | Status: AC
  Administered 2014-01-25: 1000 mg via INTRAVENOUS
  Filled 2014-01-25: qty 200

## 2014-01-25 MED ORDER — PIPERACILLIN-TAZOBACTAM 3.375 G IVPB
INTRAVENOUS | Status: AC
  Filled 2014-01-25: qty 50

## 2014-01-25 MED ORDER — VANCOMYCIN HCL IN DEXTROSE 1-5 GM/200ML-% IV SOLN
1000.0000 mg | INTRAVENOUS | Status: DC
  Administered 2014-01-26 – 2014-01-28 (×3): 1000 mg via INTRAVENOUS
  Filled 2014-01-25 (×6): qty 200

## 2014-01-25 MED ORDER — ACETAMINOPHEN 650 MG RE SUPP
650.0000 mg | Freq: Once | RECTAL | Status: AC
  Administered 2014-01-25: 650 mg via RECTAL
  Filled 2014-01-25: qty 1

## 2014-01-25 MED ORDER — INSULIN ASPART 100 UNIT/ML ~~LOC~~ SOLN
0.0000 [IU] | Freq: Four times a day (QID) | SUBCUTANEOUS | Status: DC
  Administered 2014-01-25: 2 [IU] via SUBCUTANEOUS
  Administered 2014-01-25: 5 [IU] via SUBCUTANEOUS
  Administered 2014-01-26 (×3): 2 [IU] via SUBCUTANEOUS
  Administered 2014-01-27 (×3): 1 [IU] via SUBCUTANEOUS
  Administered 2014-01-28 (×2): 2 [IU] via SUBCUTANEOUS
  Administered 2014-01-28: 1 [IU] via SUBCUTANEOUS
  Administered 2014-01-28: 2 [IU] via SUBCUTANEOUS
  Administered 2014-01-29: 18:00:00 via SUBCUTANEOUS
  Administered 2014-01-29 (×2): 3 [IU] via SUBCUTANEOUS
  Administered 2014-01-29: 5 [IU] via SUBCUTANEOUS
  Administered 2014-01-30 (×2): 2 [IU] via SUBCUTANEOUS
  Administered 2014-01-30: 3 [IU] via SUBCUTANEOUS
  Administered 2014-01-30: 2 [IU] via SUBCUTANEOUS
  Administered 2014-01-30: 3 [IU] via SUBCUTANEOUS
  Administered 2014-01-31 (×2): 2 [IU] via SUBCUTANEOUS
  Administered 2014-01-31: 3 [IU] via SUBCUTANEOUS
  Administered 2014-02-01 – 2014-02-02 (×6): 2 [IU] via SUBCUTANEOUS

## 2014-01-25 NOTE — Progress Notes (Signed)
Received patient from ED RN stating patient was a level two sepsis patient.  No protocol orders were in place.  Called MD to verify.  MD stated he did want to do sepsis protocol for 24 hours.  [facility] called for protocol orders.  No central line in place, Elink MD stated to readdress central line with admitting MD.  Dr. Karilyn Cota did not want to place central line at this time.  D/cd sepsis protocol.  NS 500cc bolus ordered. Deep suction prn.  Will pass on in report to oncoming RN. Schonewitz, Candelaria Stagers 01/25/2014

## 2014-01-25 NOTE — ED Provider Notes (Signed)
CSN: [id number]     Arrival date & time 01/25/14  1451 History   First MD Initiated Contact with Patient 01/25/14 1505     Chief Complaint  Patient presents with  . Altered Mental Status     (Consider location/radiation/quality/duration/timing/severity/associated sxs/prior Treatment) HPI Comments: Patient from home with decreased mental status over the past one day. Previous admissions for delirium from urinary tract infections. Daughter states patient has beenminimally responsive today with fever to 101. He's not been eating and drinking today or taking his medications. Patient is nonverbal, not answering questions or following commands.   The history is provided by the patient, the EMS personnel and a relative. The history is limited by the condition of the patient.    Past Medical History  Diagnosis Date  . Coronary atherosclerosis of native coronary artery 2000    CABG 2000 following acute MI - refused followup cardiac catheterization 2012  . Gout   . Hypothyroidism   . Seizures   . Ventricular tachycardia     On amiodarone  . Diabetes mellitus, type 2   . Cerebrovascular disease 2004    Left cerebral CVA followed by carotid endarterectomy in [place]  . Essential hypertension, benign   . Gastritis 2004    Gastric erosions  . Hyperlipidemia   . Ischemic cardiomyopathy     LVEF 45% 2013   Past Surgical History  Procedure Laterality Date  . Coronary artery bypass graft  04/1999    Dr. Barry Dienes  . Carotid endarterectomy    . Cholecystectomy     Family History  Problem Relation Age of Onset  . Heart disease Father    History  Substance Use Topics  . Smoking status: Former Smoker -- 1.50 packs/day for 40 years    Types: Cigarettes    Quit date: 04/08/1999  . Smokeless tobacco: Never Used     Comment: quit 1999  . Alcohol Use: No    Review of Systems  Unable to perform ROS: Acuity of condition      Allergies  Review of patient's allergies indicates no known  allergies.  Home Medications   Prior to Admission medications   Medication Sig Start Date End Date Taking? Authorizing Provider  allopurinol (ZYLOPRIM) 300 MG tablet Take 300 mg by mouth every morning.    Yes Historical Provider, MD  amiodarone (PACERONE) 200 MG tablet Take 1 tablet (200 mg total) by mouth daily. 11/18/13  Yes Fredirick Maudlin, MD  amLODipine (NORVASC) 5 MG tablet Take 5 mg by mouth every morning.    Yes Historical Provider, MD  atorvastatin (LIPITOR) 80 MG tablet Take 40 mg by mouth at bedtime.    Yes Historical Provider, MD  carvedilol (COREG) 25 MG tablet Take 25 mg by mouth 2 (two) times daily with a meal.   Yes Historical Provider, MD  ciprofloxacin (CILOXAN) 0.3 % ophthalmic solution Place 1 drop into both eyes as needed (Only uses as needed for flares).  03/09/13  Yes Historical Provider, MD  diazepam (VALIUM) 5 MG/ML solution Take 10 mg by mouth every 6 (six) hours as needed for anxiety.   Yes Historical Provider, MD  dipyridamole-aspirin (AGGRENOX) 25-200 MG per 12 hr capsule Take 1 capsule by mouth 2 (two) times daily.   Yes Historical Provider, MD  fenofibrate 160 MG tablet Take 160 mg by mouth every morning.    Yes Historical Provider, MD  glimepiride (AMARYL) 4 MG tablet Take 4 mg by mouth 2 (two) times daily with a meal.  Yes Historical Provider, MD  hydrALAZINE (APRESOLINE) 50 MG tablet Take 50 mg by mouth 4 (four) times daily.   Yes Historical Provider, MD  HYDROcodone-acetaminophen (NORCO/VICODIN) 5-325 MG per tablet Take 1 tablet by mouth every 6 (six) hours as needed for pain.   Yes Historical Provider, MD  insulin glargine (LANTUS) 100 UNIT/ML injection Inject 25 Units into the skin at bedtime. If sugar is too low none is given   Yes Historical Provider, MD  isosorbide mononitrate (IMDUR) 30 MG 24 hr tablet Take 1 tablet (30 mg total) by mouth daily. 08/25/13  Yes Fredirick Maudlin, MD  levETIRAcetam (KEPPRA) 500 MG tablet Take 500 mg by mouth 2 (two) times  daily.   Yes Historical Provider, MD  levothyroxine (SYNTHROID, LEVOTHROID) 200 MCG tablet Takes tablet and 25 mcg tablet for total dose of   Yes Historical Provider, MD  levothyroxine (SYNTHROID, LEVOTHROID) 25 MCG tablet Takes tablet and 25 mcg tablet for total dose of   Yes Historical Provider, MD  LINZESS 145 MCG CAPS capsule Take 145 mcg by mouth daily as needed (for constipation).  12/03/13  Yes Historical Provider, MD  losartan (COZAAR) 25 MG tablet Take 25 mg by mouth daily. 10/20/13  Yes Historical Provider, MD  phenytoin (DILANTIN) 100 MG ER capsule Take 100-300 mg by mouth 2 (two) times daily. Patient take 100 in the morning and 300 at night 11/18/13  Yes Fredirick Maudlin, MD  potassium chloride (K-DUR) 10 MEQ tablet Take 20 mEq by mouth 3 (three) times daily.    Yes Historical Provider, MD  promethazine (PHENERGAN) 25 MG tablet Take 12.5-25 tablets by mouth daily as needed for nausea.  03/09/13  Yes Historical Provider, MD  spironolactone (ALDACTONE) 25 MG tablet Take 12.5 mg by mouth daily.   Yes Historical Provider, MD  Tamsulosin HCl (FLOMAX) 0.4 MG CAPS Take 0.4 mg by mouth 2 (two) times daily.   Yes Historical Provider, MD  torsemide (DEMADEX) 20 MG tablet Take 1 tablet (20 mg total) by mouth 2 (two) times daily. 11/18/13  Yes Fredirick Maudlin, MD  metolazone (ZAROXOLYN) 5 MG tablet Take 5 mg by mouth 2 (two) times daily as needed. Swelling    Historical Provider, MD  nitroGLYCERIN (NITROSTAT) 0.4 MG SL tablet Place 0.4 mg under the tongue every 5 (five) minutes x 3 doses as needed for chest pain.    Historical Provider, MD   BP 99/34  Pulse 55  Temp(Src) 96.6 F (35.9 C) (Core (Comment))  Resp 14  Ht  (1.626 m)  Wt 165 lb 2 oz (74.9 kg)  BMI 28.33 kg/m2  SpO2 100% Physical Exam  Nursing note and vitals reviewed. Constitutional: He appears well-developed. He appears distressed.  Nonverbal, not following commands Dry mucous membranes, sonorous  respirations  HENT:  Head: Normocephalic and atraumatic.  Mouth/Throat: Oropharynx is clear and moist. No oropharyngeal exudate.  White exudate in mouth.  Eyes: Conjunctivae and EOM are normal. Pupils are equal, round, and reactive to light.  Neck: Normal range of motion. Neck supple.  No meningismus.  Cardiovascular: Normal rate, regular rhythm, normal heart sounds and intact distal pulses.   No murmur heard. Pulmonary/Chest: Breath sounds normal. He is in respiratory distress.  Tachypnea with increased work of breathing  Abdominal: Soft. There is no tenderness. There is no rebound and no guarding.  Genitourinary:  Foley in place with tan appearing urine  Musculoskeletal: Normal range of motion. He exhibits no edema and no  tenderness.  Neurological:  Nonverbal, minimally responsive, not following commands, tracks with eyes  Skin: Skin is warm.  Psychiatric: He has a normal mood and affect. His behavior is normal.    ED Course  Procedures (including critical care time) Labs Review Labs Reviewed  CBC WITH DIFFERENTIAL - Abnormal; Notable for the following:    RBC 3.57 (*)    Hemoglobin 11.4 (*)    HCT 34.4 (*)    Neutrophils Relative % 85 (*)    Neutro Abs 8.7 (*)    Lymphocytes Relative 7 (*)    All other components within normal limits  COMPREHENSIVE METABOLIC PANEL - Abnormal; Notable for the following:    Glucose, Bld 212 (*)    BUN 56 (*)    Creatinine, Ser 2.60 (*)    Calcium 10.9 (*)    Albumin 2.4 (*)    AST 55 (*)    Total Bilirubin 2.1 (*)    GFR calc non Af Amer 22 (*)    GFR calc Af Amer 26 (*)    All other components within normal limits  URINALYSIS, ROUTINE W REFLEX MICROSCOPIC - Abnormal; Notable for the following:    Color, Urine ORANGE (*)    APPearance CLOUDY (*)    Glucose, UA 100 (*)    Hgb urine dipstick LARGE (*)    Bilirubin Urine MODERATE (*)    Ketones, ur TRACE (*)    Protein, ur >300 (*)    Urobilinogen, UA 4.0 (*)    Nitrite POSITIVE  (*)    Leukocytes, UA MODERATE (*)    All other components within normal limits  BLOOD GAS, ARTERIAL - Abnormal; Notable for the following:    pO2, Arterial 61.8 (*)    Bicarbonate 24.4 (*)    Allens test (pass/fail) NOT INDICATED (*)    All other components within normal limits  PROTIME-INR - Abnormal; Notable for the following:    Prothrombin Time 15.9 (*)    All other components within normal limits  PHENYTOIN LEVEL, TOTAL - Abnormal; Notable for the following:    Phenytoin Lvl 2.5 (*)    All other components within normal limits  URINE MICROSCOPIC-ADD ON - Abnormal; Notable for the following:    Squamous Epithelial / LPF FEW (*)    Bacteria, UA MANY (*)    All other components within normal limits  GLUCOSE, CAPILLARY - Abnormal; Notable for the following:    Glucose-Capillary 253 (*)    All other components within normal limits  CULTURE, EXPECTORATED SPUTUM-ASSESSMENT  MRSA PCR SCREENING  CULTURE, BLOOD (ROUTINE X 2)  CULTURE, BLOOD (ROUTINE X 2)  URINE CULTURE  GRAM STAIN  CULTURE, RESPIRATORY (NON-EXPECTORATED)  COMPREHENSIVE METABOLIC PANEL  LEGIONELLA ANTIGEN, URINE  STREP PNEUMONIAE URINARY ANTIGEN  CBC  I-STAT CG4 LACTIC ACID, ED    Imaging Review Dg Chest Port 1 View  01/25/2014   CLINICAL DATA:  Altered mental status  EXAM: PORTABLE CHEST - 1 VIEW  COMPARISON:  December 06, 2013  FINDINGS: There is patchy infiltrate in the right base. Elsewhere lungs are clear. Heart is enlarged with pulmonary vascularity within normal limits. No adenopathy. Patient is status post coronary artery bypass grafting.  IMPRESSION: Right base infiltrate.  Stable cardiomegaly.   Electronically Signed   By: Bretta Bang M.D.   On: 01/25/2014 15:46     EKG Interpretation   Date/Time:  Monday January 25 2014 14:58:19 EDT Ventricular Rate:  87 PR Interval:  154 QRS Duration: 167 QT Interval:  434 QTC Calculation: 522 R Axis:   124 Text Interpretation:  Sinus or ectopic atrial  rhythm RBBB and LPFB  Confirmed by COOK  MD, BRIAN ([id number]) on 01/25/2014 3:38:03 PM      MDM   Final diagnoses:  Acute respiratory failure with hypoxia  Sepsis, due to unspecified organism   patient from home with mental status change and fever over the past day. he is minimally responsive and not following commands. Code sepsis activated on arrival.  Discuss with patient's daughter at bedside the critical nature of his illness. She understands that he is critically ill and at risk for death. His DO NOT RESUSCITATE form in place. She confirms that he would not want life support or aggressive interventions.  She is agreeable to IV fluids and antibiotics. We'll obtain cultures.  Right-sided pneumonia seen. Patient last echocardiogram showed ejection fraction of 45-50%. He is given 1 L of IV fluid and in 100 cc per hour. Systolic blood pressure 100. He appears volume dehydrated and will receive an additional liter of fluid. He is in acute renal failure with creatinine of 2.6. UA dirty.   Patient with hypoxia to the 70s on 4 L. ABG shows hypoxia with PO2 of 61. Nonrebreather placed with improvement in saturations to the mid 90s. Step down admission discussed with Dr. Karilyn Cota. Family aware of patient's critical illness and poor prognosis.  CRITICAL CARE Performed by: Glynn Octave Total critical care time: 60 Critical care time was exclusive of separately billable procedures and treating other patients. Critical care was necessary to treat or prevent imminent or life-threatening deterioration. Critical care was time spent personally by me on the following activities: development of treatment plan with patient and/or surrogate as well as nursing, discussions with consultants, evaluation of patient's response to treatment, examination of patient, obtaining history from patient or surrogate, ordering and performing treatments and interventions, ordering and review of laboratory studies,  ordering and review of radiographic studies, pulse oximetry and re-evaluation of patient's condition.      Glynn Octave, MD 01/25/14 [phone]

## 2014-01-25 NOTE — ED Notes (Addendum)
Per Report, pt has received 2 NS bolus infusions. Per EDP, to admin NS at 13ml/hour after boluses have infused. NS at rate of 179ml/hr started at 1717.

## 2014-01-25 NOTE — ED Notes (Addendum)
AMS. Daughter states pt was fine prior to 1000 per EMS. CBG was 166 en route. Foley drainage is a very cloudy orange.

## 2014-01-25 NOTE — ED Notes (Signed)
EDP notified of pt 

## 2014-01-25 NOTE — Progress Notes (Signed)
ANTIBIOTIC CONSULT NOTE - INITIAL  Pharmacy Consult for Vancomycin & Zosyn Indication: rule out sepsis  No Known Allergies  Patient Measurements:   Adjusted Body Weight:   Vital Signs: BP: 115/42 mmHg (09/21 1545) Pulse Rate: 94 (09/21 1545) Intake/Output from previous day:   Intake/Output from this shift:    Labs:  Recent Labs  01/25/14 1513  WBC 10.3  HGB 11.4*  PLT 292  CREATININE 2.60*   The CrCl is unknown because both a height and weight (above a minimum accepted value) are required for this calculation. No results found for this basename: VANCOTROUGH, VANCOPEAK, VANCORANDOM, GENTTROUGH, GENTPEAK, GENTRANDOM, TOBRATROUGH, TOBRAPEAK, TOBRARND, AMIKACINPEAK, AMIKACINTROU, AMIKACIN,  in the last 72 hours   Microbiology: No results found for this or any previous visit (from the past 720 hour(s)).  Medical History: Past Medical History  Diagnosis Date  . Coronary atherosclerosis of native coronary artery 2000    CABG 2000 following acute MI - refused followup cardiac catheterization 2012  . Gout   . Hypothyroidism   . Seizures   . Ventricular tachycardia     On amiodarone  . Diabetes mellitus, type 2   . Cerebrovascular disease 2004    Left cerebral CVA followed by carotid endarterectomy in [place]  . Essential hypertension, benign   . Gastritis 2004    Gastric erosions  . Hyperlipidemia   . Ischemic cardiomyopathy     LVEF 45% 2013    Medications:  Scheduled:   Assessment: Vancomycin & Zosyn pharmacy protocol for sepsis, patient in ED SCR 2.6, calculated CrCl 22 ml/min Vancomycin 1 GM IV and Zosyn 3.375 GM IV given in ED  Goal of Therapy:  Vancomycin trough level 15-20 mcg/ml  Plan:  Vancomycin 1 GM IV every 24 hours, next dose 01/26/14 3 PM Zosyn 3.375 GM IV every 8 hours, infuse each dose over 4 hours Vancomycin trough at steady state Monitor renal function Labs per protocol  [name], Olivene Cookston Bennett 01/25/2014,4:15 PM

## 2014-01-25 NOTE — H&P (Signed)
[facility] History and Physical  NIKOLAUS PIENTA UJW:[id number] DOB: 1933/10/26 DOA: 01/25/2014  Referring physician: ER PCP: Fredirick Maudlin, MD   Chief Complaint: Respiratory distress, reduced level of consciousness  HPI: Donald Owen is a 80 y.o. male  This is a 80 year old man who is unable to give a history because he has become extremely sick in the last few days. He has decreased mental status over the last couple of days. He has been noted to have a fever of 101 today. He has not been eating and drinking for the last week. He has a history of dementia and for the last 3 months he is really not been able to walk independently. When he is well, he is able to verbalize and feed himself. When he presented to the emergency room, he was moribund, hypotensive, hypoxemic requiring increasing amounts of supplemental oxygen. The family, 2 daughters, are at the bedside.   Review of Systems:  Unable to give me review of systems secondary to his reduced level of consciousness.   Past Medical History  Diagnosis Date  . Coronary atherosclerosis of native coronary artery 2000    CABG 2000 following acute MI - refused followup cardiac catheterization 2012  . Gout   . Hypothyroidism   . Seizures   . Ventricular tachycardia     On amiodarone  . Diabetes mellitus, type 2   . Cerebrovascular disease 2004    Left cerebral CVA followed by carotid endarterectomy in [place]  . Essential hypertension, benign   . Gastritis 2004    Gastric erosions  . Hyperlipidemia   . Ischemic cardiomyopathy     LVEF 45% 2013   Past Surgical History  Procedure Laterality Date  . Coronary artery bypass graft  04/1999    Dr. Barry Dienes  . Carotid endarterectomy    . Cholecystectomy     Social History:  reports that he quit smoking about 14 years ago. His smoking use included Cigarettes. He has a 60 pack-year smoking history. He has never used smokeless tobacco. He reports that he does not drink alcohol or  use illicit drugs.  No Known Allergies  Family History  Problem Relation Age of Onset  . Heart disease Father      Prior to Admission medications   Medication Sig Start Date End Date Taking? Authorizing Provider  allopurinol (ZYLOPRIM) 300 MG tablet Take 300 mg by mouth every morning.    Yes Historical Provider, MD  amiodarone (PACERONE) 200 MG tablet Take 1 tablet (200 mg total) by mouth daily. 11/18/13  Yes Fredirick Maudlin, MD  amLODipine (NORVASC) 5 MG tablet Take 5 mg by mouth every morning.    Yes Historical Provider, MD  atorvastatin (LIPITOR) 80 MG tablet Take 40 mg by mouth at bedtime.    Yes Historical Provider, MD  carvedilol (COREG) 25 MG tablet Take 25 mg by mouth 2 (two) times daily with a meal.   Yes Historical Provider, MD  ciprofloxacin (CILOXAN) 0.3 % ophthalmic solution Place 1 drop into both eyes as needed (Only uses as needed for flares).  03/09/13  Yes Historical Provider, MD  diazepam (VALIUM) 5 MG/ML solution Take 10 mg by mouth every 6 (six) hours as needed for anxiety.   Yes Historical Provider, MD  dipyridamole-aspirin (AGGRENOX) 25-200 MG per 12 hr capsule Take 1 capsule by mouth 2 (two) times daily.   Yes Historical Provider, MD  fenofibrate 160 MG tablet Take 160 mg by mouth every morning.    Yes  Historical Provider, MD  glimepiride (AMARYL) 4 MG tablet Take 4 mg by mouth 2 (two) times daily with a meal.    Yes Historical Provider, MD  hydrALAZINE (APRESOLINE) 50 MG tablet Take 50 mg by mouth 4 (four) times daily.   Yes Historical Provider, MD  HYDROcodone-acetaminophen (NORCO/VICODIN) 5-325 MG per tablet Take 1 tablet by mouth every 6 (six) hours as needed for pain.   Yes Historical Provider, MD  insulin glargine (LANTUS) 100 UNIT/ML injection Inject 25 Units into the skin at bedtime. If sugar is too low none is given   Yes Historical Provider, MD  isosorbide mononitrate (IMDUR) 30 MG 24 hr tablet Take 1 tablet (30 mg total) by mouth daily. 08/25/13  Yes Fredirick Maudlin, MD  levETIRAcetam (KEPPRA) 500 MG tablet Take 500 mg by mouth 2 (two) times daily.   Yes Historical Provider, MD  levothyroxine (SYNTHROID, LEVOTHROID) 200 MCG tablet Takes tablet and 25 mcg tablet for total dose of   Yes Historical Provider, MD  levothyroxine (SYNTHROID, LEVOTHROID) 25 MCG tablet Takes tablet and 25 mcg tablet for total dose of   Yes Historical Provider, MD  LINZESS 145 MCG CAPS capsule Take 145 mcg by mouth daily as needed (for constipation).  12/03/13  Yes Historical Provider, MD  losartan (COZAAR) 25 MG tablet Take 25 mg by mouth daily. 10/20/13  Yes Historical Provider, MD  phenytoin (DILANTIN) 100 MG ER capsule Take 100-300 mg by mouth 2 (two) times daily. Patient take 100 in the morning and 300 at night 11/18/13  Yes Fredirick Maudlin, MD  potassium chloride (K-DUR) 10 MEQ tablet Take 20 mEq by mouth 3 (three) times daily.    Yes Historical Provider, MD  promethazine (PHENERGAN) 25 MG tablet Take 12.5-25 tablets by mouth daily as needed for nausea.  03/09/13  Yes Historical Provider, MD  spironolactone (ALDACTONE) 25 MG tablet Take 12.5 mg by mouth daily.   Yes Historical Provider, MD  Tamsulosin HCl (FLOMAX) 0.4 MG CAPS Take 0.4 mg by mouth 2 (two) times daily.   Yes Historical Provider, MD  torsemide (DEMADEX) 20 MG tablet Take 1 tablet (20 mg total) by mouth 2 (two) times daily. 11/18/13  Yes Fredirick Maudlin, MD  metolazone (ZAROXOLYN) 5 MG tablet Take 5 mg by mouth 2 (two) times daily as needed. Swelling    Historical Provider, MD  nitroGLYCERIN (NITROSTAT) 0.4 MG SL tablet Place 0.4 mg under the tongue every 5 (five) minutes x 3 doses as needed for chest pain.    Historical Provider, MD   Physical Exam: Filed Vitals:   01/25/14 1730 01/25/14 1736 01/25/14 1745 01/25/14 1802  BP: 102/43  103/42 100/39  Pulse: 76  74 72  Temp:  99.2 F (37.3 C)  99 F (37.2 C)  TempSrc:  Core (Comment)  Core (Comment)  Resp: SpO2: 100%   100% 100%    Wt Readings from Last 3 Encounters:  12/06/13 81.4 kg (179 lb 7.3 oz)  11/18/13 88 kg (194 lb 0.1 oz)  08/25/13 101.2 kg (223 lb 1.7 oz)    General:  Appears obtunded. Soft blood pressure. He looks septic. He looks clinically dehydrated. Eyes: PERRL, normal lids, irises & conjunctiva ENT: grossly normal hearing, lips & tongue Neck: no LAD, masses or thyromegaly Cardiovascular: RRR, no m/r/g. No LE edema. Telemetry: SR, no arrhythmias  Respiratory: Reduced air entry in the right lower zone. No bronchial breathing, wheezing or crackles. Abdomen:  soft, ntnd Skin: no rash or induration seen on limited exam Musculoskeletal: grossly normal tone BUE/BLE  Neurologic: Not examined.           Labs on Admission:  Basic Metabolic Panel:  Recent Labs Lab 01/25/14 1513  NA 146  K 5.2  CL 107  CO2 27  GLUCOSE 212*  BUN 56*  CREATININE 2.60*  CALCIUM 10.9*   Liver Function Tests:  Recent Labs Lab 01/25/14 1513  AST 55*  ALT 20  ALKPHOS 49  BILITOT 2.1*  PROT 7.2  ALBUMIN 2.4*   No results found for this basename: LIPASE, AMYLASE,  in the last 168 hours No results found for this basename: AMMONIA,  in the last 168 hours CBC:  Recent Labs Lab 01/25/14 1513  WBC 10.3  NEUTROABS 8.7*  HGB 11.4*  HCT 34.4*  MCV 96.4  PLT 292   Cardiac Enzymes: No results found for this basename: CKTOTAL, CKMB, CKMBINDEX, TROPONINI,  in the last 168 hours  BNP (last 3 results)  Recent Labs  08/19/13 1632 11/11/13 1344  PROBNP 2134.0* 2652.0*   CBG: No results found for this basename: GLUCAP,  in the last 168 hours  Radiological Exams on Admission: Dg Chest Port 1 View  01/25/2014   CLINICAL DATA:  Altered mental status  EXAM: PORTABLE CHEST - 1 VIEW  COMPARISON:  December 06, 2013  FINDINGS: There is patchy infiltrate in the right base. Elsewhere lungs are clear. Heart is enlarged with pulmonary vascularity within normal limits. No adenopathy. Patient is status  post coronary artery bypass grafting.  IMPRESSION: Right base infiltrate.  Stable cardiomegaly.   Electronically Signed   By: Bretta Bang M.D.   On: 01/25/2014 15:46      Assessment/Plan   1. Community acquired pneumonia, right lower lobe. Respiratory failure. 2. UTI. 3. Sepsis secondary to #1 #2. 4. Moderate dementia. 5. History of combined systolic and diastolic congestive heart failure, appears to be stable at this point. 6. Acute on chronic renal failure. 7. Diabetes. 8. Hypertension, currently hypotensive. 9. Prognosis appears somewhat poor at this point.  Plan: 1. Admit to step down unit. 2. Intravenous fluids for rehydration and bolus as required to maintain blood pressure. 3. Intravenous antibiotics to cover pneumonia and UTI. 4. Sliding scale of insulin to control diabetes.  Further recommendations will depend on patient's hospital progress. Family is realistic about patient's poor condition.  Code Status: DO NOT RESUSCITATE.  DVT Prophylaxis: Heparin.  Family Communication: I discussed the plan with the patient's daughters at the bedside.   Disposition Plan: Depending on progress.   Time spent: 60 minutes.  Mclear Singer [facility] Pager [phone].

## 2014-01-25 NOTE — ED Notes (Signed)
Hospitalist at bedside 

## 2014-01-25 NOTE — ED Notes (Signed)
No urine noted to standard drainage bag.

## 2014-01-25 NOTE — ED Notes (Signed)
PT O2 sats decreasing and pt switched to non-rebreather at 15L.

## 2014-01-26 ENCOUNTER — Inpatient Hospital Stay (HOSPITAL_COMMUNITY): Payer: Medicare Other

## 2014-01-26 LAB — GLUCOSE, CAPILLARY
GLUCOSE-CAPILLARY: 151 mg/dL — AB (ref 70–99)
GLUCOSE-CAPILLARY: 159 mg/dL — AB (ref 70–99)
Glucose-Capillary: 159 mg/dL — ABNORMAL HIGH (ref 70–99)

## 2014-01-26 LAB — COMPREHENSIVE METABOLIC PANEL
ALBUMIN: 1.8 g/dL — AB (ref 3.5–5.2)
ALBUMIN: 1.9 g/dL — AB (ref 3.5–5.2)
ALK PHOS: 39 U/L (ref 39–117)
ALT: 17 U/L (ref 0–53)
ALT: 17 U/L (ref 0–53)
AST: 41 U/L — ABNORMAL HIGH (ref 0–37)
AST: 44 U/L — ABNORMAL HIGH (ref 0–37)
Alkaline Phosphatase: 35 U/L — ABNORMAL LOW (ref 39–117)
Anion gap: 14 (ref 5–15)
Anion gap: 9 (ref 5–15)
BILIRUBIN TOTAL: 1.3 mg/dL — AB (ref 0.3–1.2)
BUN: 57 mg/dL — ABNORMAL HIGH (ref 6–23)
BUN: 58 mg/dL — AB (ref 6–23)
CHLORIDE: 111 meq/L (ref 96–112)
CHLORIDE: 113 meq/L — AB (ref 96–112)
CO2: 22 meq/L (ref 19–32)
CO2: 25 mEq/L (ref 19–32)
CREATININE: 2.72 mg/dL — AB (ref 0.50–1.35)
Calcium: 9.6 mg/dL (ref 8.4–10.5)
Calcium: 9.7 mg/dL (ref 8.4–10.5)
Creatinine, Ser: 2.77 mg/dL — ABNORMAL HIGH (ref 0.50–1.35)
GFR calc Af Amer: 24 mL/min — ABNORMAL LOW (ref >90)
GFR calc Af Amer: 24 mL/min — ABNORMAL LOW (ref >90)
GFR calc non Af Amer: 21 mL/min — ABNORMAL LOW (ref >90)
GFR, EST NON AFRICAN AMERICAN: 20 mL/min — AB (ref >90)
Glucose, Bld: 146 mg/dL — ABNORMAL HIGH (ref 70–99)
Glucose, Bld: 186 mg/dL — ABNORMAL HIGH (ref 70–99)
POTASSIUM: 4.7 meq/L (ref 3.7–5.3)
Potassium: 4.5 mEq/L (ref 3.7–5.3)
SODIUM: 145 meq/L (ref 137–147)
Sodium: 149 mEq/L — ABNORMAL HIGH (ref 137–147)
Total Bilirubin: 1.2 mg/dL (ref 0.3–1.2)
Total Protein: 5.6 g/dL — ABNORMAL LOW (ref 6.0–8.3)
Total Protein: 5.8 g/dL — ABNORMAL LOW (ref 6.0–8.3)

## 2014-01-26 LAB — CBC
HCT: 28.3 % — ABNORMAL LOW (ref 39.0–52.0)
HCT: 28.9 % — ABNORMAL LOW (ref 39.0–52.0)
Hemoglobin: 9.1 g/dL — ABNORMAL LOW (ref 13.0–17.0)
Hemoglobin: 9.4 g/dL — ABNORMAL LOW (ref 13.0–17.0)
MCH: 31.4 pg (ref 26.0–34.0)
MCH: 31.9 pg (ref 26.0–34.0)
MCHC: 32.2 g/dL (ref 30.0–36.0)
MCHC: 32.5 g/dL (ref 30.0–36.0)
MCV: 97.6 fL (ref 78.0–100.0)
MCV: 98 fL (ref 78.0–100.0)
PLATELETS: 243 10*3/uL (ref 150–400)
Platelets: 218 10*3/uL (ref 150–400)
RBC: 2.9 MIL/uL — ABNORMAL LOW (ref 4.22–5.81)
RBC: 2.95 MIL/uL — AB (ref 4.22–5.81)
RDW: 15.1 % (ref 11.5–15.5)
RDW: 15.2 % (ref 11.5–15.5)
WBC: 8.6 10*3/uL (ref 4.0–10.5)
WBC: 9.1 10*3/uL (ref 4.0–10.5)

## 2014-01-26 LAB — URINE CULTURE

## 2014-01-26 LAB — LACTIC ACID, PLASMA: LACTIC ACID, VENOUS: 1 mmol/L (ref 0.5–2.2)

## 2014-01-26 LAB — CREATININE, URINE, RANDOM: Creatinine, Urine: 143.11 mg/dL

## 2014-01-26 LAB — STREP PNEUMONIAE URINARY ANTIGEN: STREP PNEUMO URINARY ANTIGEN: NEGATIVE

## 2014-01-26 LAB — SODIUM, URINE, RANDOM: Sodium, Ur: 24 mEq/L

## 2014-01-26 MED ORDER — CETYLPYRIDINIUM CHLORIDE 0.05 % MT LIQD
7.0000 mL | Freq: Two times a day (BID) | OROMUCOSAL | Status: DC
  Administered 2014-01-26 – 2014-02-02 (×14): 7 mL via OROMUCOSAL

## 2014-01-26 MED ORDER — SODIUM CHLORIDE 0.45 % IV SOLN
INTRAVENOUS | Status: DC
  Administered 2014-01-26 – 2014-01-28 (×5): via INTRAVENOUS

## 2014-01-26 MED ORDER — PNEUMOCOCCAL VAC POLYVALENT 25 MCG/0.5ML IJ INJ
0.5000 mL | INJECTION | INTRAMUSCULAR | Status: DC
  Filled 2014-01-26: qty 0.5

## 2014-01-26 MED ORDER — INFLUENZA VAC SPLIT QUAD 0.5 ML IM SUSY
0.5000 mL | PREFILLED_SYRINGE | INTRAMUSCULAR | Status: DC
  Filled 2014-01-26: qty 0.5

## 2014-01-26 NOTE — Evaluation (Signed)
Received call from ICU nurse the patient with worsening vital signs. Noted to have been admitted for acute respiratory failure in the setting of community-acquired pneumonia. Overall poor prognosis. Had a relatively lengthy discussion with family. Patient is a DO NOT RESUSCITATE. Discuss overall prognosis. Family is aware. Will check stat labs including CBC, CMP, lactate. Family is currently declining any aggressive measures including pressors as patient expressed explicit DO NOT RESUSCITATE plans to the family per family. Continue with IV fluids. Continue with vancomycin and Zosyn. Filed Vitals:   01/25/14 2330  BP: 83/35  Pulse: 53  Temp: 96.5 F (35.8 C)  Resp: 13

## 2014-01-26 NOTE — Progress Notes (Signed)
UR chart review completed.  

## 2014-01-26 NOTE — Progress Notes (Signed)
INITIAL NUTRITION ASSESSMENT  DOCUMENTATION CODES Per approved criteria  -Not Applicable   INTERVENTION: -RD to follow for diet advancement and goals of care  NUTRITION DIAGNOSIS: Inadequate oral intake related to inability to eat as evidenced by NPO.   Goal: Pt will meet nutrition needs as able  Monitor:  Diet advancement, PO intake, labs, weight changes, I/O's, goals of care  Reason for Assessment: MST=2  80 y.o. male  Admitting Dx: <principal problem not specified>  This is a 80 year old man who is unable to give a history because he has become extremely sick in the last few days. He has decreased mental status over the last couple of days. He has been noted to have a fever of 101 today. He has not been eating and drinking for the last week. He has a history of dementia and for the last 3 months he is really not been able to walk independently. When he is well, he is able to verbalize and feed himself.  When he presented to the emergency room, he was moribund, hypotensive, hypoxemic requiring increasing amounts of supplemental oxygen.  ASSESSMENT: Pt admitted with AMS.  Pt with hx of poor po intake and weight loss. He is able to feed himself at baseline, however, has not been able to do so over the past few days due to AMS. He is currently on a non-rebreather mask and overall prognosis is very poor. Family is denying any aggressive measures for pt and has been made DNR.  Pt has experienced progressive weight loss over the past year, including a 10.8% wt loss over approximately the past 6-8 weeks.  Pt also with increased calorie and protein needs, due to multiple wounds. RD will follow for goals of care.  Labs reviewed. Na: 149. BUN/Creat: 58/2.77. Glucose: 146. CBGS: [id number].   Height: Ht Readings from Last 1 Encounters:  01/25/14  (1.626 m)    Weight: Wt Readings from Last 1 Encounters:  01/26/14 173 lb 15.1 oz (78.9 kg)    Ideal Body Weight: 130#  % Ideal Body  Weight: 144%  Wt Readings from Last 10 Encounters:  01/26/14 173 lb 15.1 oz (78.9 kg)  12/06/13 179 lb 7.3 oz (81.4 kg)  11/18/13 194 lb 0.1 oz (88 kg)  08/25/13 223 lb 1.7 oz (101.2 kg)  03/12/13 227 lb 12.8 oz (103.329 kg)  08/12/12 248 lb (112.492 kg)  07/17/12 257 lb 8 oz (116.8 kg)  01/08/12 262 lb 8 oz (119.069 kg)  10/02/11 254 lb (115.214 kg)  06/28/11 257 lb (116.574 kg)    Usual Body Weight: 260#  % Usual Body Weight: 67%  BMI:  Body mass index is 29.84 kg/(m^2). Overweight  Estimated Nutritional Needs: Kcal: 2300-2500 Protein: 103-113 grams Fluid: 2.3-2.5 L  Skin: stage III pressure ulcer posterior mid sacrum, deep tissue injury on rt heel, deep tissue injury on lt heel, deep tissue injury on rt lateral foot   Diet Order: NPO  EDUCATION NEEDS: -Education not appropriate at this time   Intake/Output Summary (Last 24 hours) at 01/26/14 0843 Last data filed at 01/26/14 0500  Gross per 24 hour  Intake 1321.67 ml  Output      0 ml  Net 1321.67 ml    Last BM: PTA  Labs:   Recent Labs Lab 01/25/14 1513 01/26/14 0025 01/26/14 0458  NA 146 145 149*  K 5.2 4.5 4.7  CL 107 111 113*  CO2 BUN 56* 57* 58*  CREATININE 2.60* 2.72*  2.77*  CALCIUM 10.9* 9.7 9.6  GLUCOSE 212* 186* 146*    CBG (last 3)   Recent Labs  01/25/14 1844 01/25/14 2350 01/26/14 0625  GLUCAP 253* 181* 159*    Scheduled Meds: . antiseptic oral rinse  7 mL Mouth Rinse BID  . heparin  5,000 Units Subcutaneous 3 times per day  . [START ON 01/27/2014] Influenza vac split quadrivalent PF  0.5 mL Intramuscular Tomorrow-1000  . insulin aspart  0-9 Units Subcutaneous Q6H  . piperacillin-tazobactam (ZOSYN)  IV  3.375 g Intravenous Q8H  . [START ON 01/27/2014] pneumococcal 23 valent vaccine  0.5 mL Intramuscular Tomorrow-1000  . vancomycin  1,000 mg Intravenous Q24H    Continuous Infusions: . sodium chloride 125 mL/hr at 01/26/14 0500    Past Medical History   Diagnosis Date  . Coronary atherosclerosis of native coronary artery 2000    CABG 2000 following acute MI - refused followup cardiac catheterization 2012  . Gout   . Hypothyroidism   . Seizures   . Ventricular tachycardia     On amiodarone  . Diabetes mellitus, type 2   . Cerebrovascular disease 2004    Left cerebral CVA followed by carotid endarterectomy in [place]  . Essential hypertension, benign   . Gastritis 2004    Gastric erosions  . Hyperlipidemia   . Ischemic cardiomyopathy     LVEF 45% 2013    Past Surgical History  Procedure Laterality Date  . Coronary artery bypass graft  04/1999    Dr. Barry Dienes  . Carotid endarterectomy    . Cholecystectomy      Janyra Barillas A. Mayford Knife, RD, LDN Pager: [phone]

## 2014-01-26 NOTE — Consult Note (Signed)
Donald Owen MRN: [id number] DOB/AGE: 05/30/1933 80 y.o. Primary Care Physician:HAWKINS,EDWARD L, MD Admit date: 01/25/2014 Chief Complaint:  Chief Complaint  Patient presents with  . Altered Mental Status   HPI: Pt is 80 year old male with past medical hx of DM who was brought to ER with c/o AMS.  HPI dates back to past few days whe pt has been more confused than his baseline. Pt has hx of dementia but it able to perform ADL - like feed himself but from last few days he is unable to do so. On evaluation in Er pt was found to be hypoxic, hypotensive and febrile. Pt was admitted with CAP. Pt is not able to communicate. Pt family gives hx of decreased intake but still giving pills Pt family also gives hx that he has kidney ds from many years.    Past Medical History  Diagnosis Date  . Coronary atherosclerosis of native coronary artery 2000    CABG 2000 following acute MI - refused followup cardiac catheterization 2012  . Gout   . Hypothyroidism   . Seizures   . Ventricular tachycardia     On amiodarone  . Diabetes mellitus, type 2   . Cerebrovascular disease 2004    Left cerebral CVA followed by carotid endarterectomy in [place]  . Essential hypertension, benign   . Gastritis 2004    Gastric erosions  . Hyperlipidemia   . Ischemic cardiomyopathy     LVEF 45% 2013        Family History  Problem Relation Age of Onset  . Heart disease Father     Social History:  reports that he quit smoking about 14 years ago. His smoking use included Cigarettes. He has a 60 pack-year smoking history. He has never used smokeless tobacco. He reports that he does not drink alcohol or use illicit drugs.   Allergies: No Known Allergies  Medications Prior to Admission  Medication Sig Dispense Refill  . allopurinol (ZYLOPRIM) 300 MG tablet Take 300 mg by mouth every morning.       Marland Kitchen amiodarone (PACERONE) 200 MG tablet Take 1 tablet (200 mg total) by mouth daily.  30 tablet  12  .  amLODipine (NORVASC) 5 MG tablet Take 5 mg by mouth every morning.       Marland Kitchen atorvastatin (LIPITOR) 80 MG tablet Take 40 mg by mouth at bedtime.       . carvedilol (COREG) 25 MG tablet Take 25 mg by mouth 2 (two) times daily with a meal.      . ciprofloxacin (CILOXAN) 0.3 % ophthalmic solution Place 1 drop into both eyes as needed (Only uses as needed for flares).       . diazepam (VALIUM) 5 MG/ML solution Take 10 mg by mouth every 6 (six) hours as needed for anxiety.      . dipyridamole-aspirin (AGGRENOX) 25-200 MG per 12 hr capsule Take 1 capsule by mouth 2 (two) times daily.      . fenofibrate 160 MG tablet Take 160 mg by mouth every morning.       Marland Kitchen glimepiride (AMARYL) 4 MG tablet Take 4 mg by mouth 2 (two) times daily with a meal.       . hydrALAZINE (APRESOLINE) 50 MG tablet Take 50 mg by mouth 4 (four) times daily.      Marland Kitchen HYDROcodone-acetaminophen (NORCO/VICODIN) 5-325 MG per tablet Take 1 tablet by mouth every 6 (six) hours as needed for pain.      Marland Kitchen  insulin glargine (LANTUS) 100 UNIT/ML injection Inject 25 Units into the skin at bedtime. If sugar is too low none is given      . isosorbide mononitrate (IMDUR) 30 MG 24 hr tablet Take 1 tablet (30 mg total) by mouth daily.  30 tablet  12  . levETIRAcetam (KEPPRA) 500 MG tablet Take 500 mg by mouth 2 (two) times daily.      Marland Kitchen levothyroxine (SYNTHROID, LEVOTHROID) 200 MCG tablet Takes tablet and 25 mcg tablet for total dose of      . levothyroxine (SYNTHROID, LEVOTHROID) 25 MCG tablet Takes tablet and 25 mcg tablet for total dose of      . LINZESS 145 MCG CAPS capsule Take 145 mcg by mouth daily as needed (for constipation).       Marland Kitchen losartan (COZAAR) 25 MG tablet Take 25 mg by mouth daily.      . phenytoin (DILANTIN) 100 MG ER capsule Take 100-300 mg by mouth 2 (two) times daily. Patient take 100 in the morning and 300 at night      . potassium chloride (K-DUR) 10 MEQ tablet Take 20 mEq by mouth 3 (three) times daily.        . promethazine (PHENERGAN) 25 MG tablet Take 12.5-25 tablets by mouth daily as needed for nausea.       Marland Kitchen spironolactone (ALDACTONE) 25 MG tablet Take 12.5 mg by mouth daily.      . Tamsulosin HCl (FLOMAX) 0.4 MG CAPS Take 0.4 mg by mouth 2 (two) times daily.      Marland Kitchen torsemide (DEMADEX) 20 MG tablet Take 1 tablet (20 mg total) by mouth 2 (two) times daily.  60 tablet  12  . metolazone (ZAROXOLYN) 5 MG tablet Take 5 mg by mouth 2 (two) times daily as needed. Swelling      . nitroGLYCERIN (NITROSTAT) 0.4 MG SL tablet Place 0.4 mg under the tongue every 5 (five) minutes x 3 doses as needed for chest pain.           ZOX:WRUEAV to offer any complaint.    Marland Kitchen antiseptic oral rinse  7 mL Mouth Rinse BID  . heparin  5,000 Units Subcutaneous 3 times per day  . [START ON 01/27/2014] Influenza vac split quadrivalent PF  0.5 mL Intramuscular Tomorrow-1000  . insulin aspart  0-9 Units Subcutaneous Q6H  . piperacillin-tazobactam (ZOSYN)  IV  3.375 g Intravenous Q8H  . [START ON 01/27/2014] pneumococcal 23 valent vaccine  0.5 mL Intramuscular Tomorrow-1000  . vancomycin  1,000 mg Intravenous Q24H     Physical Exam: Vital signs in last 24 hours: Temp:  [96.5 F (35.8 C)-99.5 F (37.5 C)] 97.8 F (36.6 C) (09/22 0630) Pulse Rate:  [50-94] 60 (09/22 0630) Resp:  [12-22] 14 (09/22 0630) BP: (77-115)/(30-43) 100/33 mmHg (09/22 0630) SpO2:  [78 %-100 %] 100 % (09/22 0630) Weight:  [165 lb 2 oz (74.9 kg)-173 lb 15.1 oz (78.9 kg)] 173 lb 15.1 oz (78.9 kg) (09/22 0453) Weight change:     Intake/Output from previous day: 09/21 0701 - 09/22 0700 In: 1321.7 [I.V.:1221.7; IV Piggyback:100] Out: -      Physical Exam: General- pt is lethargic, opens eyeys, non communicative Resp- No acute REsp distress, Rhonchi+ CVS- S1S2 regular in rate and rhythm GIT- BS+, soft, NT, ND EXT- NO LE Edema, Cyanosis    Lab Results: CBC  Recent Labs  01/26/14 0025 01/26/14 0458  WBC 9.1 8.6  HGB 9.1*  9.4*  HCT 28.3* 28.9*  PLT 218 243    BMET  Recent Labs  01/26/14 0025 01/26/14 0458  NA 145 149*  K 4.5 4.7  CL 111 113*  CO2 25 22  GLUCOSE 186* 146*  BUN 57* 58*  CREATININE 2.72* 2.77*  CALCIUM 9.7 9.6    Creat Trend 2015 2.1=>2.77          1.7--2.3 July admission           1.9--2.4- April admission           2014   1.6--1.8 2013   1.4--2.1 2012   1.6--2.1 2010    2.42  MICRO Recent Results (from the past 240 hour(s))  MRSA PCR SCREENING     Status: None   Collection Time    01/25/14  6:12 PM      Result Value Ref Range Status   MRSA by PCR NEGATIVE  NEGATIVE Final   Comment:            The GeneXpert MRSA Assay (FDA     approved for NASAL specimens     only), is one component of a     comprehensive MRSA colonization     surveillance program. It is not     intended to diagnose MRSA     infection nor to guide or     monitor treatment for     MRSA infections.  CULTURE, EXPECTORATED SPUTUM-ASSESSMENT     Status: None   Collection Time    01/25/14  7:50 PM      Result Value Ref Range Status   Specimen Description SPUTUM   Final   Special Requests Normal   Final   Sputum evaluation     Final   Value: THIS SPECIMEN IS ACCEPTABLE. RESPIRATORY CULTURE REPORT TO FOLLOW.   Report Status 01/25/2014 FINAL   Final      Lab Results  Component Value Date   CALCIUM 9.6 01/26/2014      Impression: 1)Renal  AKI secondary to Multiple factors                            Hypovolemia/Hypotension/ARB/Sepsis               AKI on CKD               AKI sec to Prerenal/ ATN               Oluguria ATN               CKD stage 3/4 .               CKD since 2010               CKD secondary to DM/ HTN/Age associated decline/Multiple AKI             2)CVS- Hemodynamically unstable             Not on pressors   3)Anemia HGb at goal (9--11)   4)CKD Mineral-Bone Disorder PTH not avail. Calcium at goal  5)Id- admitted with CAP On ABX ,Primary MD  following  6)Electrolyes  Normokalemic Hypernatremic     Iatrogenic- pt on NS which has   7)Acid base Co2 at goal     Plan:  Will change IVf to 1/2 ns alternating with NS Will ask for 2 d echo Will try lasix in am ( if bp better 0 to help  convert oliguriac ATN to NON oliguric ATN. Will ask for FENA I had extensive discussion with family about his renal issues.      BHUTANI,MANPREET S 01/26/2014, 8:50 AM

## 2014-01-26 NOTE — Plan of Care (Signed)
Problem: Consults Goal: General Medical Patient Education See Patient Education Module for specific education. Outcome: Not Progressing Pt is a DNR per family prior discussion with pt. Family @ bedside with supportive efforts provided by staff & Alvester Morin MD for poor prognosis of Pt. Goal: Skin Care Protocol Initiated - if Braden Score 18 or less If consults are not indicated, leave blank or document N/A Outcome: Progressing Skin care protocol initiated with foam drsg's for several  Skin issues.(see charting) Goal: Diabetes Guidelines if Diabetic/Glucose > 140 If diabetic or lab glucose is > 140 mg/dl - Initiate Diabetes/Hyperglycemia Guidelines & Document Interventions  Outcome: Progressing CBG checks every 6 hours with RX'd coverage  Problem: Phase I Progression Outcomes Goal: Pain controlled with appropriate interventions Outcome: Progressing Pt shows no S/S of pain Goal: Voiding-avoid urinary catheter unless indicated Outcome: Not Progressing Pt has no urine output per foley   BP has been low with MAP in 40's Goal: Hemodynamically stable Outcome: Not Progressing Currently Bp 88/32 with map 46. Pt has had 3 500cc boluses

## 2014-01-26 NOTE — Progress Notes (Signed)
Subjective: Patient was admitted yesterday due respiratory failure and change in mental status. Patient was found to be septic an also has acute renal failure with oliguria. He is on 100% non breather mask and not responding to verbal communication. I have discussed his condition with his family.  Objective: Vital signs in last 24 hours: Temp:  [96.5 F (35.8 C)-99.5 F (37.5 C)] 97.8 F (36.6 C) (09/22 0630) Pulse Rate:  [50-94] 60 (09/22 0630) Resp:  [12-22] 14 (09/22 0630) BP: (77-115)/(30-43) 100/33 mmHg (09/22 0630) SpO2:  [78 %-100 %] 100 % (09/22 0630) Weight:  [74.9 kg (165 lb 2 oz)-78.9 kg (173 lb 15.1 oz)] 78.9 kg (173 lb 15.1 oz) (09/22 0453) Weight change:     Intake/Output from previous day: 09/21 0701 - 09/22 0700 In: 1321.7 [I.V.:1221.7; IV Piggyback:100] Out: -   PHYSICAL EXAM General appearance: delirious and mild distress Resp: diminished breath sounds bilaterally and rhonchi bilaterally Cardio: S1, S2 normal GI: soft, non-tender; bowel sounds normal; no masses,  no organomegaly Extremities: extremities normal, atraumatic, no cyanosis or edema  Lab Results:  Results for orders placed during the hospital encounter of 01/25/14 (from the past 48 hour(s))  CBC WITH DIFFERENTIAL     Status: Abnormal   Collection Time    01/25/14  3:13 PM      Result Value Ref Range   WBC 10.3  4.0 - 10.5 K/uL   RBC 3.57 (*) 4.22 - 5.81 MIL/uL   Hemoglobin 11.4 (*) 13.0 - 17.0 g/dL   HCT 34.4 (*) 39.0 - 52.0 %   MCV 96.4  78.0 - 100.0 fL   MCH 31.9  26.0 - 34.0 pg   MCHC 33.1  30.0 - 36.0 g/dL   RDW 15.0  11.5 - 15.5 %   Platelets 292  150 - 400 K/uL   Neutrophils Relative % 85 (*) 43 - 77 %   Neutro Abs 8.7 (*) 1.7 - 7.7 K/uL   Lymphocytes Relative 7 (*) 12 - 46 %   Lymphs Abs 0.8  0.7 - 4.0 K/uL   Monocytes Relative 8  3 - 12 %   Monocytes Absolute 0.8  0.1 - 1.0 K/uL   Eosinophils Relative 0  0 - 5 %   Eosinophils Absolute 0.0  0.0 - 0.7 K/uL   Basophils Relative 0   0 - 1 %   Basophils Absolute 0.0  0.0 - 0.1 K/uL  COMPREHENSIVE METABOLIC PANEL     Status: Abnormal   Collection Time    01/25/14  3:13 PM      Result Value Ref Range   Sodium 146  137 - 147 mEq/L   Potassium 5.2  3.7 - 5.3 mEq/L   Chloride 107  96 - 112 mEq/L   CO2 27  19 - 32 mEq/L   Glucose, Bld 212 (*) 70 - 99 mg/dL   BUN 56 (*) 6 - 23 mg/dL   Creatinine, Ser 2.60 (*) 0.50 - 1.35 mg/dL   Calcium 10.9 (*) 8.4 - 10.5 mg/dL   Total Protein 7.2  6.0 - 8.3 g/dL   Albumin 2.4 (*) 3.5 - 5.2 g/dL   AST 55 (*) 0 - 37 U/L   ALT 20  0 - 53 U/L   Alkaline Phosphatase 49  39 - 117 U/L   Total Bilirubin 2.1 (*) 0.3 - 1.2 mg/dL   GFR calc non Af Amer 22 (*) >90 mL/min   GFR calc Af Amer 26 (*) >90 mL/min   Comment: (  NOTE)     The eGFR has been calculated using the CKD EPI equation.     This calculation has not been validated in all clinical situations.     eGFR's persistently <90 mL/min signify possible Chronic Kidney     Disease.   Anion gap 12  5 - 15  PROTIME-INR     Status: Abnormal   Collection Time    01/25/14  3:13 PM      Result Value Ref Range   Prothrombin Time 15.9 (*) 11.6 - 15.2 seconds   INR 1.27  0.00 - 1.49  PHENYTOIN LEVEL, TOTAL     Status: Abnormal   Collection Time    01/25/14  3:13 PM      Result Value Ref Range   Phenytoin Lvl 2.5 (*) 10.0 - 20.0 ug/mL  I-STAT CG4 LACTIC ACID, ED     Status: None   Collection Time    01/25/14  3:47 PM      Result Value Ref Range   Lactic Acid, Venous 1.47  0.5 - 2.2 mmol/L  URINALYSIS, ROUTINE W REFLEX MICROSCOPIC     Status: Abnormal   Collection Time    01/25/14  4:15 PM      Result Value Ref Range   Color, Urine ORANGE (*) YELLOW   Comment: BIOCHEMICALS MAY BE AFFECTED BY COLOR   APPearance CLOUDY (*) CLEAR   Specific Gravity, Urine 1.025  1.005 - 1.030   pH 5.0  5.0 - 8.0   Glucose, UA 100 (*) NEGATIVE mg/dL   Hgb urine dipstick LARGE (*) NEGATIVE   Bilirubin Urine MODERATE (*) NEGATIVE   Ketones, ur TRACE (*)  NEGATIVE mg/dL   Protein, ur >300 (*) NEGATIVE mg/dL   Urobilinogen, UA 4.0 (*) 0.0 - 1.0 mg/dL   Nitrite POSITIVE (*) NEGATIVE   Leukocytes, UA MODERATE (*) NEGATIVE  URINE MICROSCOPIC-ADD ON     Status: Abnormal   Collection Time    01/25/14  4:15 PM      Result Value Ref Range   Squamous Epithelial / LPF FEW (*) RARE   WBC, UA TOO NUMEROUS TO COUNT  <3 WBC/hpf   RBC / HPF TOO NUMEROUS TO COUNT  <3 RBC/hpf   Bacteria, UA MANY (*) RARE  BLOOD GAS, ARTERIAL     Status: Abnormal   Collection Time    01/25/14  4:32 PM      Result Value Ref Range   O2 Content 4.0     Delivery systems NASAL CANNULA     pH, Arterial 7.388  7.350 - 7.450   pCO2 arterial 41.4  35.0 - 45.0 mmHg   pO2, Arterial 61.8 (*) 80.0 - 100.0 mmHg   Bicarbonate 24.4 (*) 20.0 - 24.0 mEq/L   TCO2 21.6  0 - 100 mmol/L   Acid-Base Excess 0.0  0.0 - 2.0 mmol/L   O2 Saturation 90.8     Patient temperature 37.0     Collection site RIGHT BRACHIAL     Drawn by COLLECTED BY RT     Sample type ARTERIAL     Allens test (pass/fail) NOT INDICATED (*) PASS  MRSA PCR SCREENING     Status: None   Collection Time    01/25/14  6:12 PM      Result Value Ref Range   MRSA by PCR NEGATIVE  NEGATIVE   Comment:            The GeneXpert MRSA Assay (FDA     approved for  NASAL specimens     only), is one component of a     comprehensive MRSA colonization     surveillance program. It is not     intended to diagnose MRSA     infection nor to guide or     monitor treatment for     MRSA infections.  GLUCOSE, CAPILLARY     Status: Abnormal   Collection Time    01/25/14  6:44 PM      Result Value Ref Range   Glucose-Capillary 253 (*) 70 - 99 mg/dL   Comment 1 Documented in Chart     Comment 2 Notify RN    CULTURE, EXPECTORATED SPUTUM-ASSESSMENT     Status: None   Collection Time    01/25/14  7:50 PM      Result Value Ref Range   Specimen Description SPUTUM     Special Requests Normal     Sputum evaluation       Value: THIS  SPECIMEN IS ACCEPTABLE. RESPIRATORY CULTURE REPORT TO FOLLOW.   Report Status 01/25/2014 FINAL    GLUCOSE, CAPILLARY     Status: Abnormal   Collection Time    01/25/14 11:50 PM      Result Value Ref Range   Glucose-Capillary 181 (*) 70 - 99 mg/dL  LACTIC ACID, PLASMA     Status: None   Collection Time    01/26/14 12:25 AM      Result Value Ref Range   Lactic Acid, Venous 1.0  0.5 - 2.2 mmol/L  COMPREHENSIVE METABOLIC PANEL     Status: Abnormal   Collection Time    01/26/14 12:25 AM      Result Value Ref Range   Sodium 145  137 - 147 mEq/L   Potassium 4.5  3.7 - 5.3 mEq/L   Chloride 111  96 - 112 mEq/L   CO2 25  19 - 32 mEq/L   Glucose, Bld 186 (*) 70 - 99 mg/dL   BUN 57 (*) 6 - 23 mg/dL   Creatinine, Ser 2.72 (*) 0.50 - 1.35 mg/dL   Calcium 9.7  8.4 - 10.5 mg/dL   Total Protein 5.8 (*) 6.0 - 8.3 g/dL   Albumin 1.8 (*) 3.5 - 5.2 g/dL   AST 44 (*) 0 - 37 U/L   ALT 17  0 - 53 U/L   Alkaline Phosphatase 35 (*) 39 - 117 U/L   Total Bilirubin 1.3 (*) 0.3 - 1.2 mg/dL   GFR calc non Af Amer 21 (*) >90 mL/min   GFR calc Af Amer 24 (*) >90 mL/min   Comment: (NOTE)     The eGFR has been calculated using the CKD EPI equation.     This calculation has not been validated in all clinical situations.     eGFR's persistently <90 mL/min signify possible Chronic Kidney     Disease.   Anion gap 9  5 - 15  CBC     Status: Abnormal   Collection Time    01/26/14 12:25 AM      Result Value Ref Range   WBC 9.1  4.0 - 10.5 K/uL   RBC 2.90 (*) 4.22 - 5.81 MIL/uL   Hemoglobin 9.1 (*) 13.0 - 17.0 g/dL   Comment: DELTA CHECK NOTED   HCT 28.3 (*) 39.0 - 52.0 %   MCV 97.6  78.0 - 100.0 fL   MCH 31.4  26.0 - 34.0 pg   MCHC 32.2  30.0 - 36.0 g/dL  RDW 15.1  11.5 - 15.5 %   Platelets 218  150 - 400 K/uL   Comment: DELTA CHECK NOTED  COMPREHENSIVE METABOLIC PANEL     Status: Abnormal   Collection Time    01/26/14  4:58 AM      Result Value Ref Range   Sodium 149 (*) 137 - 147 mEq/L   Potassium  4.7  3.7 - 5.3 mEq/L   Chloride 113 (*) 96 - 112 mEq/L   CO2 22  19 - 32 mEq/L   Glucose, Bld 146 (*) 70 - 99 mg/dL   BUN 58 (*) 6 - 23 mg/dL   Creatinine, Ser 2.77 (*) 0.50 - 1.35 mg/dL   Calcium 9.6  8.4 - 10.5 mg/dL   Total Protein 5.6 (*) 6.0 - 8.3 g/dL   Albumin 1.9 (*) 3.5 - 5.2 g/dL   AST 41 (*) 0 - 37 U/L   ALT 17  0 - 53 U/L   Alkaline Phosphatase 39  39 - 117 U/L   Total Bilirubin 1.2  0.3 - 1.2 mg/dL   GFR calc non Af Amer 20 (*) >90 mL/min   GFR calc Af Amer 24 (*) >90 mL/min   Comment: (NOTE)     The eGFR has been calculated using the CKD EPI equation.     This calculation has not been validated in all clinical situations.     eGFR's persistently <90 mL/min signify possible Chronic Kidney     Disease.   Anion gap 14  5 - 15  CBC     Status: Abnormal   Collection Time    01/26/14  4:58 AM      Result Value Ref Range   WBC 8.6  4.0 - 10.5 K/uL   RBC 2.95 (*) 4.22 - 5.81 MIL/uL   Hemoglobin 9.4 (*) 13.0 - 17.0 g/dL   HCT 28.9 (*) 39.0 - 52.0 %   MCV 98.0  78.0 - 100.0 fL   MCH 31.9  26.0 - 34.0 pg   MCHC 32.5  30.0 - 36.0 g/dL   RDW 15.2  11.5 - 15.5 %   Platelets 243  150 - 400 K/uL  GLUCOSE, CAPILLARY     Status: Abnormal   Collection Time    01/26/14  6:25 AM      Result Value Ref Range   Glucose-Capillary 159 (*) 70 - 99 mg/dL    ABGS  Recent Labs  01/25/14 1632  PHART 7.388  PO2ART 61.8*  TCO2 21.6  HCO3 24.4*   CULTURES Recent Results (from the past 240 hour(s))  MRSA PCR SCREENING     Status: None   Collection Time    01/25/14  6:12 PM      Result Value Ref Range Status   MRSA by PCR NEGATIVE  NEGATIVE Final   Comment:            The GeneXpert MRSA Assay (FDA     approved for NASAL specimens     only), is one component of a     comprehensive MRSA colonization     surveillance program. It is not     intended to diagnose MRSA     infection nor to guide or     monitor treatment for     MRSA infections.  CULTURE, EXPECTORATED  SPUTUM-ASSESSMENT     Status: None   Collection Time    01/25/14  7:50 PM      Result Value Ref Range Status   Specimen  Description SPUTUM   Final   Special Requests Normal   Final   Sputum evaluation     Final   Value: THIS SPECIMEN IS ACCEPTABLE. RESPIRATORY CULTURE REPORT TO FOLLOW.   Report Status 01/25/2014 FINAL   Final   Studies/Results: Dg Chest Port 1 View  01/25/2014   CLINICAL DATA:  Altered mental status  EXAM: PORTABLE CHEST - 1 VIEW  COMPARISON:  December 06, 2013  FINDINGS: There is patchy infiltrate in the right base. Elsewhere lungs are clear. Heart is enlarged with pulmonary vascularity within normal limits. No adenopathy. Patient is status post coronary artery bypass grafting.  IMPRESSION: Right base infiltrate.  Stable cardiomegaly.   Electronically Signed   By: Lowella Grip M.D.   On: 01/25/2014 15:46    Medications: I have reviewed the patient's current medications.  Assesment:   Active Problems:   HYPERTENSION   Diabetes mellitus, type 2   Acute on chronic renal failure   Dementia with behavioral disturbance   Chronic combined systolic and diastolic congestive heart failure   Respiratory failure with hypoxia   Sepsis   UTI (lower urinary tract infection)   Community acquired pneumonia   Pneumonia    Plan: Medications reviewed Will continue combination Iv antibiotics  Will continue iv fluid and oxygen therapy Will do abdominal ultrasound Nephrology consult Will monitor BMP Prognosis: poor.    LOS: 1 day   Maille Halliwell 01/26/2014, 8:39 AM

## 2014-01-27 LAB — LEGIONELLA ANTIGEN, URINE: Legionella Antigen, Urine: NEGATIVE

## 2014-01-27 LAB — BASIC METABOLIC PANEL
Anion gap: 14 (ref 5–15)
BUN: 62 mg/dL — ABNORMAL HIGH (ref 6–23)
CO2: 21 mEq/L (ref 19–32)
Calcium: 9.6 mg/dL (ref 8.4–10.5)
Chloride: 112 mEq/L (ref 96–112)
Creatinine, Ser: 2.49 mg/dL — ABNORMAL HIGH (ref 0.50–1.35)
GFR calc Af Amer: 27 mL/min — ABNORMAL LOW (ref >90)
GFR calc non Af Amer: 23 mL/min — ABNORMAL LOW (ref >90)
Glucose, Bld: 113 mg/dL — ABNORMAL HIGH (ref 70–99)
Potassium: 4.4 mEq/L (ref 3.7–5.3)
Sodium: 147 mEq/L (ref 137–147)

## 2014-01-27 LAB — GLUCOSE, CAPILLARY
GLUCOSE-CAPILLARY: 137 mg/dL — AB (ref 70–99)
GLUCOSE-CAPILLARY: 146 mg/dL — AB (ref 70–99)
Glucose-Capillary: 117 mg/dL — ABNORMAL HIGH (ref 70–99)
Glucose-Capillary: 127 mg/dL — ABNORMAL HIGH (ref 70–99)

## 2014-01-27 MED ORDER — FUROSEMIDE 10 MG/ML IJ SOLN
40.0000 mg | Freq: Once | INTRAMUSCULAR | Status: AC
  Administered 2014-01-27: 40 mg via INTRAVENOUS
  Filled 2014-01-27: qty 4

## 2014-01-27 MED ORDER — PHENYTOIN SODIUM 50 MG/ML IJ SOLN
100.0000 mg | Freq: Four times a day (QID) | INTRAMUSCULAR | Status: DC
  Administered 2014-01-27 – 2014-02-02 (×23): 100 mg via INTRAVENOUS
  Filled 2014-01-27 (×23): qty 2

## 2014-01-27 NOTE — Progress Notes (Signed)
Donald Owen  MRN: [id number]  DOB/AGE: 1933/06/02 80 y.o.  Primary Care Physician:Donald L, MD  Admit date: 01/25/2014  Chief Complaint:  Chief Complaint  Patient presents with  . Altered Mental Status    Owen-Pt presented on  01/25/2014 with  Chief Complaint  Patient presents with  . Altered Mental Status  .    Pt today feels better  Meds . antiseptic oral rinse  7 mL Mouth Rinse BID  . heparin  5,000 Units Subcutaneous 3 times per day  . Influenza vac split quadrivalent PF  0.5 mL Intramuscular Tomorrow-1000  . insulin aspart  0-9 Units Subcutaneous Q6H  . piperacillin-tazobactam (ZOSYN)  IV  3.375 g Intravenous Q8H  . pneumococcal 23 valent vaccine  0.5 mL Intramuscular Tomorrow-1000  . vancomycin  1,000 mg Intravenous Q24H         Physical Exam: Vital signs in last 24 hours: Temp:  [98.4 F (36.9 C)-100.6 F (38.1 C)] 99.8 F (37.7 C) (09/23 0800) Pulse Rate:  [66-80] 77 (09/23 0800) Resp:  [15-22] 17 (09/23 0800) BP: (110-129)/(33-42) 112/35 mmHg (09/23 0800) SpO2:  [91 %-100 %] 100 % (09/23 0800) Weight:  [181 lb 14.1 oz (82.5 kg)] 181 lb 14.1 oz (82.5 kg) (09/23 0457) Weight change: 16 lb 12.1 oz (7.6 kg) Last BM Date: 01/24/14 (per daughter)  Intake/Output from previous day: 09/22 0701 - 09/23 0700 In: 1283.3 [I.V.:983.3; IV Piggyback:300] Out: 410 [Urine:410] Total I/O In: 2050 [I.V.:2000; IV Piggyback:50] Out: -    Physical Exam: General- pt is lethargic, opens eyeys, non communicative  Resp- No acute REsp distress, Rhonchi+  CVS- S1S2 regular in rate and rhythm  GIT- BS+, soft, NT, ND  EXT- NO LE Edema, Cyanosis    Lab Results: CBC  Recent Labs  01/26/14 0025 01/26/14 0458  WBC 9.1 8.6  HGB 9.1* 9.4*  HCT 28.3* 28.9*  PLT 218 243    BMET  Recent Labs  01/26/14 0458 01/27/14 0439  NA 149* 147  K 4.7 4.4  CL 113* 112  CO2 22 21  GLUCOSE 146* 113*  BUN 58* 62*  CREATININE 2.77* 2.49*  CALCIUM 9.6 9.6   Creat  Trend  2015 2.1=>2.77 =>2.49 1.7--2.3 July admission  1.9--2.4- April admission  2014 1.6--1.8  2013 1.4--2.1  2012 1.6--2.1  2010 2.42    MICRO Recent Results (from the past 240 hour(Owen))  CULTURE, BLOOD (ROUTINE X 2)     Status: None   Collection Time    01/25/14  3:35 PM      Result Value Ref Range Status   Specimen Description BLOOD RIGHT HAND   Final   Special Requests BOTTLES DRAWN AEROBIC AND ANAEROBIC 8CC   Final   Culture  Setup Time     Final   Value: 01/27/2014 00:46     Performed at [facility]   Culture     Final   Value: GRAM POSITIVE COCCI IN CLUSTERS     Note: Gram Stain Report Called to,Read Back By and Verified With: [name]. AT 1854 ON 01/26/2014 BY Luetta Nutting M. Performed at [facility]     Performed at [facility]   Report Status PENDING   Incomplete  CULTURE, BLOOD (ROUTINE X 2)     Status: None   Collection Time    01/25/14  3:36 PM      Result Value Ref Range Status   Specimen Description BLOOD LEFT HAND   Final   Special Requests BOTTLES  DRAWN AEROBIC ONLY 4CC   Final   Culture NO GROWTH 1 DAY   Final   Report Status PENDING   Incomplete  URINE CULTURE     Status: None   Collection Time    01/25/14  4:15 PM      Result Value Ref Range Status   Specimen Description URINE, CATHETERIZED   Final   Special Requests NONE   Final   Culture  Setup Time     Final   Value: 01/25/2014 23:29     Performed at [facility] Count     Final   Value: >=100,000 COLONIES/ML     Performed at [facility]   Culture     Final   Value: Multiple bacterial morphotypes present, none predominant. Suggest appropriate recollection if clinically indicated.     Performed at [facility]   Report Status 01/26/2014 FINAL   Final  MRSA PCR SCREENING     Status: None   Collection Time    01/25/14  6:12 PM      Result Value Ref Range Status   MRSA by PCR NEGATIVE  NEGATIVE Final   Comment:            The GeneXpert  MRSA Assay (FDA     approved for NASAL specimens     only), is one component of a     comprehensive MRSA colonization     surveillance program. It is not     intended to diagnose MRSA     infection nor to guide or     monitor treatment for     MRSA infections.  CULTURE, EXPECTORATED SPUTUM-ASSESSMENT     Status: None   Collection Time    01/25/14  7:50 PM      Result Value Ref Range Status   Specimen Description SPUTUM   Final   Special Requests Normal   Final   Sputum evaluation     Final   Value: THIS SPECIMEN IS ACCEPTABLE. RESPIRATORY CULTURE REPORT TO FOLLOW.   Report Status 01/25/2014 FINAL   Final  CULTURE, RESPIRATORY (NON-EXPECTORATED)     Status: None   Collection Time    01/25/14  7:50 PM      Result Value Ref Range Status   Specimen Description TRACHEAL ASPIRATE   Final   Special Requests NONE   Final   Gram Stain     Final   Value: MODERATE WBC PRESENT,BOTH PMN AND MONONUCLEAR     RARE SQUAMOUS EPITHELIAL CELLS PRESENT     ABUNDANT GRAM POSITIVE COCCI     IN PAIRS IN CLUSTERS FEW GRAM NEGATIVE RODS     Performed at [facility]   Culture PENDING   Incomplete   Report Status PENDING   Incomplete      Lab Results  Component Value Date   CALCIUM 9.6 01/27/2014               Impression: 1)Renal AKI secondary to Multiple factors  Hypovolemia/Hypotension/ARB/Sepsis  AKI on CKD  AKI sec to Prerenal/ ATN  Oluguria ATN -Urine output little better AKi better.  CKD stage 3/4 .  CKD since 2010  CKD secondary to DM/ HTN/Age associated decline/Multiple AKI   2)CVS- Hemodynamically fragile, better than yesterday   3)Anemia HGb at goal (9--11)   4)CKD Mineral-Bone Disorder  PTH not avail.  Calcium at goal   5)Id- admitted with CAP  On ABX ,Primary MD following   6)Electrolyes  Normokalemic  Hypernatremic  I now better  7)Acid base  Co2 at goal      Plan:  Will give IV lasix today , jsut one dose today, wil give PRN basis  secondary to to volume. Hypotension related issues. Will follow bmet      Donald Owen 01/27/2014, 8:50 AM

## 2014-01-27 NOTE — Clinical Documentation Improvement (Signed)
PLEASE NOTE IF PRESENT ON ADMISSION  Progress notes  Per RD staff on 01/26/14:"Pt also with increased calorie and protein needs, due to multiple wounds Skin: stage III pressure ulcer posterior mid sacrum, deep tissue injury on rt heel, deep tissue injury on lt heel, deep tissue injury on rt lateral foot "  Nursing staff: Documents in flow sheet > Braden scale  Total 8 with                       Sacral foam Prophylactic dressing protocal BID  . Site (include laterality):--Sacral --Other (specify) . Pressure Ulcer Stage: --Stage 1 --Stage 2 --Stage 3 --Stage 4 --Unspecified  . Document any associated diagnoses/conditions (e.g diabetes, PVD) . Document if ulcer (including stage) is present on admission  Supporting Information: See RD assessment   Wound Nurse Assessment [facility] [place]): pending  Treatment: Dressing change per nursing staff, reposition every 2 hours, turn with assitive device-Maxislide  Thank You, Andy Gauss ,RN Clinical Documentation Specialist:  [phone]  [facility]- Health Information Management

## 2014-01-27 NOTE — Progress Notes (Signed)
Pt NT suctioned - large amounts of thick white secretions.  Pt tolerated well, sats 100% on NRB.  Will continue to monitor.

## 2014-01-27 NOTE — Progress Notes (Signed)
Pt NT suctioned per MD order and family request.  Pt tolerated well.  Moderate amount of thick white secretions.  Pt placed back on 100% NRB.  Rt will continue to monitor.

## 2014-01-27 NOTE — Progress Notes (Signed)
Subjective: Patient remained in ICU on 100% non rebreather mask. He is intermittently opening his eyes. He had about 400 cc urine out put. He is not able to take any oral feeding. Discussed nutrition with his daughter. She is not yet sure about tube feeding. Objective: Vital signs in last 24 hours: Temp:  [98.4 F (36.9 C)-100.6 F (38.1 C)] 100.4 F (38 C) (09/23 0700) Pulse Rate:  [66-80] 80 (09/23 0700) Resp:  [15-22] 20 (09/23 0700) BP: (110-129)/(33-42) 129/42 mmHg (09/23 0700) SpO2:  [91 %-100 %] 100 % (09/23 0700) Weight:  [82.5 kg (181 lb 14.1 oz)] 82.5 kg (181 lb 14.1 oz) (09/23 0457) Weight change: 7.6 kg (16 lb 12.1 oz) Last BM Date: 01/24/14 (per daughter)  Intake/Output from previous day: 09/22 0701 - 09/23 0700 In: 1283.3 [I.V.:983.3; IV Piggyback:300] Out: 410 [Urine:410]  PHYSICAL EXAM General appearance: delirious and mild distress Resp: diminished breath sounds bilaterally and rhonchi bilaterally Cardio: S1, S2 normal GI: soft, non-tender; bowel sounds normal; no masses,  no organomegaly Extremities: extremities normal, atraumatic, no cyanosis or edema  Lab Results:  Results for orders placed during the hospital encounter of 01/25/14 (from the past 48 hour(s))  CBC WITH DIFFERENTIAL     Status: Abnormal   Collection Time    01/25/14  3:13 PM      Result Value Ref Range   WBC 10.3  4.0 - 10.5 K/uL   RBC 3.57 (*) 4.22 - 5.81 MIL/uL   Hemoglobin 11.4 (*) 13.0 - 17.0 g/dL   HCT 34.4 (*) 39.0 - 52.0 %   MCV 96.4  78.0 - 100.0 fL   MCH 31.9  26.0 - 34.0 pg   MCHC 33.1  30.0 - 36.0 g/dL   RDW 15.0  11.5 - 15.5 %   Platelets 292  150 - 400 K/uL   Neutrophils Relative % 85 (*) 43 - 77 %   Neutro Abs 8.7 (*) 1.7 - 7.7 K/uL   Lymphocytes Relative 7 (*) 12 - 46 %   Lymphs Abs 0.8  0.7 - 4.0 K/uL   Monocytes Relative 8  3 - 12 %   Monocytes Absolute 0.8  0.1 - 1.0 K/uL   Eosinophils Relative 0  0 - 5 %   Eosinophils Absolute 0.0  0.0 - 0.7 K/uL   Basophils  Relative 0  0 - 1 %   Basophils Absolute 0.0  0.0 - 0.1 K/uL  COMPREHENSIVE METABOLIC PANEL     Status: Abnormal   Collection Time    01/25/14  3:13 PM      Result Value Ref Range   Sodium 146  137 - 147 mEq/L   Potassium 5.2  3.7 - 5.3 mEq/L   Chloride 107  96 - 112 mEq/L   CO2 27  19 - 32 mEq/L   Glucose, Bld 212 (*) 70 - 99 mg/dL   BUN 56 (*) 6 - 23 mg/dL   Creatinine, Ser 2.60 (*) 0.50 - 1.35 mg/dL   Calcium 10.9 (*) 8.4 - 10.5 mg/dL   Total Protein 7.2  6.0 - 8.3 g/dL   Albumin 2.4 (*) 3.5 - 5.2 g/dL   AST 55 (*) 0 - 37 U/L   ALT 20  0 - 53 U/L   Alkaline Phosphatase 49  39 - 117 U/L   Total Bilirubin 2.1 (*) 0.3 - 1.2 mg/dL   GFR calc non Af Amer 22 (*) >90 mL/min   GFR calc Af Amer 26 (*) >90 mL/min  Comment: (NOTE)     The eGFR has been calculated using the CKD EPI equation.     This calculation has not been validated in all clinical situations.     eGFR's persistently <90 mL/min signify possible Chronic Kidney     Disease.   Anion gap 12  5 - 15  PROTIME-INR     Status: Abnormal   Collection Time    01/25/14  3:13 PM      Result Value Ref Range   Prothrombin Time 15.9 (*) 11.6 - 15.2 seconds   INR 1.27  0.00 - 1.49  PHENYTOIN LEVEL, TOTAL     Status: Abnormal   Collection Time    01/25/14  3:13 PM      Result Value Ref Range   Phenytoin Lvl 2.5 (*) 10.0 - 20.0 ug/mL  CULTURE, BLOOD (ROUTINE X 2)     Status: None   Collection Time    01/25/14  3:35 PM      Result Value Ref Range   Specimen Description BLOOD RIGHT HAND     Special Requests BOTTLES DRAWN AEROBIC AND ANAEROBIC 8CC     Culture  Setup Time       Value: 01/27/2014 00:46     Performed at [facility]   Culture       Value: Sisters IN CLUSTERS     Note: Gram Stain Report Called to,Read Back By and Verified With: STONE A. AT 2297 ON 01/26/2014 BY Elza Rafter. Performed at [facility]     Performed at [facility]   Report Status PENDING    CULTURE, BLOOD (ROUTINE  X 2)     Status: None   Collection Time    01/25/14  3:36 PM      Result Value Ref Range   Specimen Description BLOOD LEFT HAND     Special Requests BOTTLES DRAWN AEROBIC ONLY 4CC     Culture NO GROWTH 1 DAY     Report Status PENDING    I-STAT CG4 LACTIC ACID, ED     Status: None   Collection Time    01/25/14  3:47 PM      Result Value Ref Range   Lactic Acid, Venous 1.47  0.5 - 2.2 mmol/L  URINALYSIS, ROUTINE W REFLEX MICROSCOPIC     Status: Abnormal   Collection Time    01/25/14  4:15 PM      Result Value Ref Range   Color, Urine ORANGE (*) YELLOW   Comment: BIOCHEMICALS MAY BE AFFECTED BY COLOR   APPearance CLOUDY (*) CLEAR   Specific Gravity, Urine 1.025  1.005 - 1.030   pH 5.0  5.0 - 8.0   Glucose, UA 100 (*) NEGATIVE mg/dL   Hgb urine dipstick LARGE (*) NEGATIVE   Bilirubin Urine MODERATE (*) NEGATIVE   Ketones, ur TRACE (*) NEGATIVE mg/dL   Protein, ur >300 (*) NEGATIVE mg/dL   Urobilinogen, UA 4.0 (*) 0.0 - 1.0 mg/dL   Nitrite POSITIVE (*) NEGATIVE   Leukocytes, UA MODERATE (*) NEGATIVE  URINE CULTURE     Status: None   Collection Time    01/25/14  4:15 PM      Result Value Ref Range   Specimen Description URINE, CATHETERIZED     Special Requests NONE     Culture  Setup Time       Value: 01/25/2014 23:29     Performed at [place]  Value: >=100,000 COLONIES/ML     Performed at [facility]   Culture       Value: Multiple bacterial morphotypes present, none predominant. Suggest appropriate recollection if clinically indicated.     Performed at [facility]   Report Status 01/26/2014 FINAL    URINE MICROSCOPIC-ADD ON     Status: Abnormal   Collection Time    01/25/14  4:15 PM      Result Value Ref Range   Squamous Epithelial / LPF FEW (*) RARE   WBC, UA TOO NUMEROUS TO COUNT  <3 WBC/hpf   RBC / HPF TOO NUMEROUS TO COUNT  <3 RBC/hpf   Bacteria, UA MANY (*) RARE  BLOOD GAS, ARTERIAL     Status: Abnormal    Collection Time    01/25/14  4:32 PM      Result Value Ref Range   O2 Content 4.0     Delivery systems NASAL CANNULA     pH, Arterial 7.388  7.350 - 7.450   pCO2 arterial 41.4  35.0 - 45.0 mmHg   pO2, Arterial 61.8 (*) 80.0 - 100.0 mmHg   Bicarbonate 24.4 (*) 20.0 - 24.0 mEq/L   TCO2 21.6  0 - 100 mmol/L   Acid-Base Excess 0.0  0.0 - 2.0 mmol/L   O2 Saturation 90.8     Patient temperature 37.0     Collection site RIGHT BRACHIAL     Drawn by COLLECTED BY RT     Sample type ARTERIAL     Allens test (pass/fail) NOT INDICATED (*) PASS  MRSA PCR SCREENING     Status: None   Collection Time    01/25/14  6:12 PM      Result Value Ref Range   MRSA by PCR NEGATIVE  NEGATIVE   Comment:            The GeneXpert MRSA Assay (FDA     approved for NASAL specimens     only), is one component of a     comprehensive MRSA colonization     surveillance program. It is not     intended to diagnose MRSA     infection nor to guide or     monitor treatment for     MRSA infections.  GLUCOSE, CAPILLARY     Status: Abnormal   Collection Time    01/25/14  6:44 PM      Result Value Ref Range   Glucose-Capillary 253 (*) 70 - 99 mg/dL   Comment 1 Documented in Chart     Comment 2 Notify RN    CULTURE, EXPECTORATED SPUTUM-ASSESSMENT     Status: None   Collection Time    01/25/14  7:50 PM      Result Value Ref Range   Specimen Description SPUTUM     Special Requests Normal     Sputum evaluation       Value: THIS SPECIMEN IS ACCEPTABLE. RESPIRATORY CULTURE REPORT TO FOLLOW.   Report Status 01/25/2014 FINAL    CULTURE, RESPIRATORY (NON-EXPECTORATED)     Status: None   Collection Time    01/25/14  7:50 PM      Result Value Ref Range   Specimen Description TRACHEAL ASPIRATE     Special Requests NONE     Gram Stain       Value: MODERATE WBC PRESENT,BOTH PMN AND MONONUCLEAR     RARE SQUAMOUS EPITHELIAL CELLS PRESENT     ABUNDANT GRAM POSITIVE COCCI  IN PAIRS IN CLUSTERS FEW GRAM NEGATIVE RODS      Performed at [facility]   Culture PENDING     Report Status PENDING    GLUCOSE, CAPILLARY     Status: Abnormal   Collection Time    01/25/14 11:50 PM      Result Value Ref Range   Glucose-Capillary 181 (*) 70 - 99 mg/dL  LACTIC ACID, PLASMA     Status: None   Collection Time    01/26/14 12:25 AM      Result Value Ref Range   Lactic Acid, Venous 1.0  0.5 - 2.2 mmol/L  COMPREHENSIVE METABOLIC PANEL     Status: Abnormal   Collection Time    01/26/14 12:25 AM      Result Value Ref Range   Sodium 145  137 - 147 mEq/L   Potassium 4.5  3.7 - 5.3 mEq/L   Chloride 111  96 - 112 mEq/L   CO2 25  19 - 32 mEq/L   Glucose, Bld 186 (*) 70 - 99 mg/dL   BUN 57 (*) 6 - 23 mg/dL   Creatinine, Ser 2.72 (*) 0.50 - 1.35 mg/dL   Calcium 9.7  8.4 - 10.5 mg/dL   Total Protein 5.8 (*) 6.0 - 8.3 g/dL   Albumin 1.8 (*) 3.5 - 5.2 g/dL   AST 44 (*) 0 - 37 U/L   ALT 17  0 - 53 U/L   Alkaline Phosphatase 35 (*) 39 - 117 U/L   Total Bilirubin 1.3 (*) 0.3 - 1.2 mg/dL   GFR calc non Af Amer 21 (*) >90 mL/min   GFR calc Af Amer 24 (*) >90 mL/min   Comment: (NOTE)     The eGFR has been calculated using the CKD EPI equation.     This calculation has not been validated in all clinical situations.     eGFR's persistently <90 mL/min signify possible Chronic Kidney     Disease.   Anion gap 9  5 - 15  CBC     Status: Abnormal   Collection Time    01/26/14 12:25 AM      Result Value Ref Range   WBC 9.1  4.0 - 10.5 K/uL   RBC 2.90 (*) 4.22 - 5.81 MIL/uL   Hemoglobin 9.1 (*) 13.0 - 17.0 g/dL   Comment: DELTA CHECK NOTED   HCT 28.3 (*) 39.0 - 52.0 %   MCV 97.6  78.0 - 100.0 fL   MCH 31.4  26.0 - 34.0 pg   MCHC 32.2  30.0 - 36.0 g/dL   RDW 15.1  11.5 - 15.5 %   Platelets 218  150 - 400 K/uL   Comment: DELTA CHECK NOTED  COMPREHENSIVE METABOLIC PANEL     Status: Abnormal   Collection Time    01/26/14  4:58 AM      Result Value Ref Range   Sodium 149 (*) 137 - 147 mEq/L   Potassium 4.7  3.7 -  5.3 mEq/L   Chloride 113 (*) 96 - 112 mEq/L   CO2 22  19 - 32 mEq/L   Glucose, Bld 146 (*) 70 - 99 mg/dL   BUN 58 (*) 6 - 23 mg/dL   Creatinine, Ser 2.77 (*) 0.50 - 1.35 mg/dL   Calcium 9.6  8.4 - 10.5 mg/dL   Total Protein 5.6 (*) 6.0 - 8.3 g/dL   Albumin 1.9 (*) 3.5 - 5.2 g/dL   AST 41 (*) 0 - 37 U/L  ALT 17  0 - 53 U/L   Alkaline Phosphatase 39  39 - 117 U/L   Total Bilirubin 1.2  0.3 - 1.2 mg/dL   GFR calc non Af Amer 20 (*) >90 mL/min   GFR calc Af Amer 24 (*) >90 mL/min   Comment: (NOTE)     The eGFR has been calculated using the CKD EPI equation.     This calculation has not been validated in all clinical situations.     eGFR's persistently <90 mL/min signify possible Chronic Kidney     Disease.   Anion gap 14  5 - 15  CBC     Status: Abnormal   Collection Time    01/26/14  4:58 AM      Result Value Ref Range   WBC 8.6  4.0 - 10.5 K/uL   RBC 2.95 (*) 4.22 - 5.81 MIL/uL   Hemoglobin 9.4 (*) 13.0 - 17.0 g/dL   HCT 28.9 (*) 39.0 - 52.0 %   MCV 98.0  78.0 - 100.0 fL   MCH 31.9  26.0 - 34.0 pg   MCHC 32.5  30.0 - 36.0 g/dL   RDW 15.2  11.5 - 15.5 %   Platelets 243  150 - 400 K/uL  GLUCOSE, CAPILLARY     Status: Abnormal   Collection Time    01/26/14  6:25 AM      Result Value Ref Range   Glucose-Capillary 159 (*) 70 - 99 mg/dL  STREP PNEUMONIAE URINARY ANTIGEN     Status: None   Collection Time    01/26/14 10:20 AM      Result Value Ref Range   Strep Pneumo Urinary Antigen NEGATIVE  NEGATIVE   Comment:            Infection due to S. pneumoniae     cannot be absolutely ruled out     since the antigen present     may be below the detection limit     of the test.     Performed at [facility]  SODIUM, URINE, RANDOM     Status: None   Collection Time    01/26/14 10:20 AM      Result Value Ref Range   Sodium, Ur 24    CREATININE, URINE, RANDOM     Status: None   Collection Time    01/26/14 10:20 AM      Result Value Ref Range   Creatinine, Urine 143.11     GLUCOSE, CAPILLARY     Status: Abnormal   Collection Time    01/26/14 11:43 AM      Result Value Ref Range   Glucose-Capillary 151 (*) 70 - 99 mg/dL   Comment 1 Documented in Chart     Comment 2 Notify RN    GLUCOSE, CAPILLARY     Status: Abnormal   Collection Time    01/26/14  5:57 PM      Result Value Ref Range   Glucose-Capillary 159 (*) 70 - 99 mg/dL   Comment 1 Documented in Chart     Comment 2 Notify RN    GLUCOSE, CAPILLARY     Status: Abnormal   Collection Time    01/27/14 12:14 AM      Result Value Ref Range   Glucose-Capillary 127 (*) 70 - 99 mg/dL  BASIC METABOLIC PANEL     Status: Abnormal   Collection Time    01/27/14  4:39 AM      Result  Value Ref Range   Sodium 147  137 - 147 mEq/L   Potassium 4.4  3.7 - 5.3 mEq/L   Chloride 112  96 - 112 mEq/L   CO2 21  19 - 32 mEq/L   Glucose, Bld 113 (*) 70 - 99 mg/dL   BUN 62 (*) 6 - 23 mg/dL   Creatinine, Ser 2.49 (*) 0.50 - 1.35 mg/dL   Calcium 9.6  8.4 - 10.5 mg/dL   GFR calc non Af Amer 23 (*) >90 mL/min   GFR calc Af Amer 27 (*) >90 mL/min   Comment: (NOTE)     The eGFR has been calculated using the CKD EPI equation.     This calculation has not been validated in all clinical situations.     eGFR's persistently <90 mL/min signify possible Chronic Kidney     Disease.   Anion gap 14  5 - 15  GLUCOSE, CAPILLARY     Status: Abnormal   Collection Time    01/27/14  6:10 AM      Result Value Ref Range   Glucose-Capillary 137 (*) 70 - 99 mg/dL    ABGS  Recent Labs  01/25/14 1632  PHART 7.388  PO2ART 61.8*  TCO2 21.6  HCO3 24.4*   CULTURES Recent Results (from the past 240 hour(s))  CULTURE, BLOOD (ROUTINE X 2)     Status: None   Collection Time    01/25/14  3:35 PM      Result Value Ref Range Status   Specimen Description BLOOD RIGHT HAND   Final   Special Requests BOTTLES DRAWN AEROBIC AND ANAEROBIC 8CC   Final   Culture  Setup Time     Final   Value: 01/27/2014 00:46     Performed at [facility]   Culture     Final   Value: GRAM POSITIVE COCCI IN CLUSTERS     Note: Gram Stain Report Called to,Read Back By and Verified With: STONE A. AT 1854 ON 01/26/2014 BY Elza Rafter. Performed at [facility]     Performed at [facility]   Report Status PENDING   Incomplete  CULTURE, BLOOD (ROUTINE X 2)     Status: None   Collection Time    01/25/14  3:36 PM      Result Value Ref Range Status   Specimen Description BLOOD LEFT HAND   Final   Special Requests BOTTLES DRAWN AEROBIC ONLY 4CC   Final   Culture NO GROWTH 1 DAY   Final   Report Status PENDING   Incomplete  URINE CULTURE     Status: None   Collection Time    01/25/14  4:15 PM      Result Value Ref Range Status   Specimen Description URINE, CATHETERIZED   Final   Special Requests NONE   Final   Culture  Setup Time     Final   Value: 01/25/2014 23:29     Performed at [place]     Final   Value: >=100,000 COLONIES/ML     Performed at [facility]   Culture     Final   Value: Multiple bacterial morphotypes present, none predominant. Suggest appropriate recollection if clinically indicated.     Performed at [facility]   Report Status 01/26/2014 FINAL   Final  MRSA PCR SCREENING     Status: None   Collection Time    01/25/14  6:12 PM  Result Value Ref Range Status   MRSA by PCR NEGATIVE  NEGATIVE Final   Comment:            The GeneXpert MRSA Assay (FDA     approved for NASAL specimens     only), is one component of a     comprehensive MRSA colonization     surveillance program. It is not     intended to diagnose MRSA     infection nor to guide or     monitor treatment for     MRSA infections.  CULTURE, EXPECTORATED SPUTUM-ASSESSMENT     Status: None   Collection Time    01/25/14  7:50 PM      Result Value Ref Range Status   Specimen Description SPUTUM   Final   Special Requests Normal   Final   Sputum evaluation     Final   Value: THIS SPECIMEN  IS ACCEPTABLE. RESPIRATORY CULTURE REPORT TO FOLLOW.   Report Status 01/25/2014 FINAL   Final  CULTURE, RESPIRATORY (NON-EXPECTORATED)     Status: None   Collection Time    01/25/14  7:50 PM      Result Value Ref Range Status   Specimen Description TRACHEAL ASPIRATE   Final   Special Requests NONE   Final   Gram Stain     Final   Value: MODERATE WBC PRESENT,BOTH PMN AND MONONUCLEAR     RARE SQUAMOUS EPITHELIAL CELLS PRESENT     ABUNDANT GRAM POSITIVE COCCI     IN PAIRS IN CLUSTERS FEW GRAM NEGATIVE RODS     Performed at [facility]   Culture PENDING   Incomplete   Report Status PENDING   Incomplete   Studies/Results: US Renal  01/26/2014   CLINICAL DATA:  Renal insufficiency.  EXAM: RENAL/URINARY TRACT ULTRASOUND COMPLETE  COMPARISON:  Nephrolithiasis.  FINDINGS: Right Kidney:  Length: 10.5 cm. Echogenicity within normal limits. No mass or hydronephrosis visualized.  Left Kidney:  Length: 11.4 cm. Echogenicity within normal limits. No mass or hydronephrosis visualized. Left nephrolithiasis cannot be excluded .  Bladder:  Appears normal for degree of bladder distention. Catheters present in the bladder. Bladder wall is thickened. This could be from nondistended state or bladder pathology.  IMPRESSION: 1. Left nephrolithiasis cannot be excluded. No left hydronephrosis or ureterectasis. 2. Foley catheter in the bladder. Bladder is nondistended. Bladder wall thickening is noted. This could be from nondistended state or bladder wall pathology.   Electronically Signed   By: Marcello Moores  Register   On: 01/26/2014 12:23   Dg Chest Port 1 View  01/25/2014   CLINICAL DATA:  Altered mental status  EXAM: PORTABLE CHEST - 1 VIEW  COMPARISON:  December 06, 2013  FINDINGS: There is patchy infiltrate in the right base. Elsewhere lungs are clear. Heart is enlarged with pulmonary vascularity within normal limits. No adenopathy. Patient is status post coronary artery bypass grafting.  IMPRESSION: Right base  infiltrate.  Stable cardiomegaly.   Electronically Signed   By: Lowella Grip M.D.   On: 01/25/2014 15:46    Medications: I have reviewed the patient's current medications.  Assesment:   Active Problems:   HYPERTENSION   Diabetes mellitus, type 2   Acute on chronic renal failure   Dementia with behavioral disturbance   Chronic combined systolic and diastolic congestive heart failure   Respiratory failure with hypoxia   Sepsis   UTI (lower urinary tract infection)   Community acquired pneumonia   Pneumonia  Plan: Medications reviewed Will continue combination Iv antibiotics  Will continue iv fluid and oxygen therapy Nephrology consult appreciated Will monitor BMP Prognosis: poor.    LOS: 2 days   Tyjanae Bartek 01/27/2014, 8:00 AM

## 2014-01-27 NOTE — Progress Notes (Addendum)
Patient's daughter wants patient to be deep suction. Patient has been orally suctioned. Patient gurgling some. Dr. Felecia Shelling notified, new orders for deep suctioning. Also the daughter was concerned about patient's seizure medications. Dr. Felecia Shelling notified. Dr. Felecia Shelling ordered NG tube and to resume seizure medications. Daughter notified of new orders. Daughter refuses NG tube. Dr Felecia Shelling notified of daughters refusal. New orders for dilantin  IVPB twice a day.

## 2014-01-28 LAB — BASIC METABOLIC PANEL
ANION GAP: 13 (ref 5–15)
BUN: 67 mg/dL — ABNORMAL HIGH (ref 6–23)
CALCIUM: 9.6 mg/dL (ref 8.4–10.5)
CO2: 20 meq/L (ref 19–32)
Chloride: 112 mEq/L (ref 96–112)
Creatinine, Ser: 2.3 mg/dL — ABNORMAL HIGH (ref 0.50–1.35)
GFR calc Af Amer: 30 mL/min — ABNORMAL LOW (ref >90)
GFR calc non Af Amer: 26 mL/min — ABNORMAL LOW (ref >90)
Glucose, Bld: 124 mg/dL — ABNORMAL HIGH (ref 70–99)
Potassium: 4.6 mEq/L (ref 3.7–5.3)
SODIUM: 145 meq/L (ref 137–147)

## 2014-01-28 LAB — CULTURE, RESPIRATORY W GRAM STAIN: Culture: NORMAL

## 2014-01-28 LAB — GLUCOSE, CAPILLARY
GLUCOSE-CAPILLARY: 208 mg/dL — AB (ref 70–99)
Glucose-Capillary: 144 mg/dL — ABNORMAL HIGH (ref 70–99)
Glucose-Capillary: 139 mg/dL — ABNORMAL HIGH (ref 70–99)
Glucose-Capillary: 153 mg/dL — ABNORMAL HIGH (ref 70–99)
Glucose-Capillary: 181 mg/dL — ABNORMAL HIGH (ref 70–99)

## 2014-01-28 LAB — CULTURE, BLOOD (ROUTINE X 2)

## 2014-01-28 LAB — CULTURE, RESPIRATORY

## 2014-01-28 MED ORDER — FUROSEMIDE 10 MG/ML IJ SOLN
60.0000 mg | Freq: Two times a day (BID) | INTRAMUSCULAR | Status: DC
  Administered 2014-01-28 – 2014-01-31 (×8): 60 mg via INTRAVENOUS
  Filled 2014-01-28 (×8): qty 6

## 2014-01-28 MED ORDER — DEXTROSE-NACL 5-0.45 % IV SOLN
INTRAVENOUS | Status: DC
  Administered 2014-01-28 – 2014-01-30 (×5): via INTRAVENOUS

## 2014-01-28 NOTE — Progress Notes (Signed)
ANTIBIOTIC CONSULT NOTE - follow up  Pharmacy Consult for Vancomycin & Zosyn Indication: rule out sepsis  No Known Allergies  Patient Measurements: Height:  (162.6 cm) Weight: 181 lb 14.1 oz (82.5 kg) IBW/kg (Calculated) : 59.2  Vital Signs: Temp: 97.7 F (36.5 C) (09/24 1200) BP: 119/35 mmHg (09/24 1200) Pulse Rate: 66 (09/24 1200) Intake/Output from previous day: 09/23 0701 - 09/24 0700 In: 3550 [I.V.:3250; IV Piggyback:300] Out: 525 [Urine:525] Intake/Output from this shift: Total I/O In: 2145.8 [I.V.:2045.8; IV Piggyback:100] Out: 125 [Urine:125]  Labs:  Recent Labs  01/25/14 1513 01/26/14 0025 01/26/14 0458 01/26/14 1020 01/27/14 0439 01/28/14 0441  WBC 10.3 9.1 8.6  --   --   --   HGB 11.4* 9.1* 9.4*  --   --   --   PLT 292 218 243  --   --   --   LABCREA  --   --   --  143.11  --   --   CREATININE 2.60* 2.72* 2.77*  --  2.49* 2.30*   Estimated Creatinine Clearance: 25.6 ml/min (by C-G formula based on Cr of 2.3). No results found for this basename: VANCOTROUGH, VANCOPEAK, VANCORANDOM, GENTTROUGH, GENTPEAK, GENTRANDOM, TOBRATROUGH, TOBRAPEAK, TOBRARND, AMIKACINPEAK, AMIKACINTROU, AMIKACIN,  in the last 72 hours   Microbiology: Recent Results (from the past 720 hour(s))  CULTURE, BLOOD (ROUTINE X 2)     Status: None   Collection Time    01/25/14  3:35 PM      Result Value Ref Range Status   Specimen Description BLOOD RIGHT HAND   Final   Special Requests BOTTLES DRAWN AEROBIC AND ANAEROBIC 8CC   Final   Culture  Setup Time     Final   Value: 01/27/2014 00:46     Performed at [facility]   Culture     Final   Value: STAPHYLOCOCCUS SPECIES (COAGULASE NEGATIVE)     Note: THE SIGNIFICANCE OF ISOLATING THIS ORGANISM FROM A SINGLE VENIPUNCTURE CANNOT BE PREDICTED WITHOUT FURTHER CLINICAL AND CULTURE CORRELATION. SUSCEPTIBILITIES AVAILABLE ONLY ON REQUEST.     Note: Gram Stain Report Called to,Read Back By and Verified With: STONE A. AT 1854 ON  01/26/2014 BY Luetta Nutting M. Performed at [facility]     Performed at [facility]   Report Status 01/28/2014 FINAL   Final  CULTURE, BLOOD (ROUTINE X 2)     Status: None   Collection Time    01/25/14  3:36 PM      Result Value Ref Range Status   Specimen Description BLOOD LEFT HAND   Final   Special Requests BOTTLES DRAWN AEROBIC ONLY 4CC   Final   Culture NO GROWTH 3 DAYS   Final   Report Status PENDING   Incomplete  URINE CULTURE     Status: None   Collection Time    01/25/14  4:15 PM      Result Value Ref Range Status   Specimen Description URINE, CATHETERIZED   Final   Special Requests NONE   Final   Culture  Setup Time     Final   Value: 01/25/2014 23:29     Performed at [facility] Count     Final   Value: >=100,000 COLONIES/ML     Performed at [facility]   Culture     Final   Value: Multiple bacterial morphotypes present, none predominant. Suggest appropriate recollection if clinically indicated.     Performed at [facility]   Report Status 01/26/2014 FINAL   Final  MRSA PCR SCREENING     Status: None   Collection Time    01/25/14  6:12 PM      Result Value Ref Range Status   MRSA by PCR NEGATIVE  NEGATIVE Final   Comment:            The GeneXpert MRSA Assay (FDA     approved for NASAL specimens     only), is one component of a     comprehensive MRSA colonization     surveillance program. It is not     intended to diagnose MRSA     infection nor to guide or     monitor treatment for     MRSA infections.  CULTURE, EXPECTORATED SPUTUM-ASSESSMENT     Status: None   Collection Time    01/25/14  7:50 PM      Result Value Ref Range Status   Specimen Description SPUTUM   Final   Special Requests Normal   Final   Sputum evaluation     Final   Value: THIS SPECIMEN IS ACCEPTABLE. RESPIRATORY CULTURE REPORT TO FOLLOW.   Report Status 01/25/2014 FINAL   Final  CULTURE, RESPIRATORY (NON-EXPECTORATED)     Status: None    Collection Time    01/25/14  7:50 PM      Result Value Ref Range Status   Specimen Description TRACHEAL ASPIRATE   Final   Special Requests NONE   Final   Gram Stain     Final   Value: MODERATE WBC PRESENT,BOTH PMN AND MONONUCLEAR     RARE SQUAMOUS EPITHELIAL CELLS PRESENT     ABUNDANT GRAM POSITIVE COCCI     IN PAIRS IN CLUSTERS FEW GRAM NEGATIVE RODS     Performed at [facility]   Culture     Final   Value: NORMAL OROPHARYNGEAL FLORA     Performed at [facility]   Report Status 01/28/2014 FINAL   Final   Medical History: Past Medical History  Diagnosis Date  . Coronary atherosclerosis of native coronary artery 2000    CABG 2000 following acute MI - refused followup cardiac catheterization 2012  . Gout   . Hypothyroidism   . Seizures   . Ventricular tachycardia     On amiodarone  . Diabetes mellitus, type 2   . Cerebrovascular disease 2004    Left cerebral CVA followed by carotid endarterectomy in [place]  . Essential hypertension, benign   . Gastritis 2004    Gastric erosions  . Hyperlipidemia   . Ischemic cardiomyopathy     LVEF 45% 2013   Medications:  Scheduled:  . antiseptic oral rinse  7 mL Mouth Rinse BID  . furosemide  60 mg Intravenous BID  . heparin  5,000 Units Subcutaneous 3 times per day  . Influenza vac split quadrivalent PF  0.5 mL Intramuscular Tomorrow-1000  . insulin aspart  0-9 Units Subcutaneous Q6H  . phenytoin (DILANTIN) IV  100 mg Intravenous 4 times per day  . piperacillin-tazobactam (ZOSYN)  IV  3.375 g Intravenous Q8H  . pneumococcal 23 valent vaccine  0.5 mL Intramuscular Tomorrow-1000  . vancomycin  1,000 mg Intravenous Q24H   Assessment: Vancomycin & Zosyn pharmacy protocol for sepsis. Culture results as above.  Tracheal aspirate with GPC. Estimated Creatinine Clearance: 25.6 ml/min (by C-G formula based on Cr of 2.3).  Currently afebrile.   Goal of Therapy:  Vancomycin trough level 15-20  mcg/ml  Plan:   Vancomycin 1 GM IV every 24 hours Zosyn 3.375 GM IV every 8 hours, each dose over 4 hours Vancomycin trough at steady state Monitor renal function Labs per protocol  Valrie Hart A 01/28/2014,12:56 PM

## 2014-01-28 NOTE — Care Management Note (Addendum)
    Page 1 of 1   02/02/2014     10:50:02 AM CARE MANAGEMENT NOTE 02/02/2014  Patient:  Donald Owen, Donald Owen   Account Number:  [id number]  Date Initiated:  01/28/2014  Documentation initiated by:  Sharrie Rothman  Subjective/Objective Assessment:   Pt admitted from home with sepsis. Pt lives with his daughter, Lupita Leash, and will return home at discharge. Pt is active with [facility] RN and PT. Pt has a hospital bed, home O2, w/c, and BSC for home use.     Action/Plan:   Will continue to follow for discharge planning needs. ? hospice/comfort care.   Anticipated DC Date:  02/01/2014   Anticipated DC Plan:  HOME W HOME HEALTH SERVICES      DC Planning Services  CM consult      [facility] Choice  HOSPICE   Choice offered to / List presented to:  C-4 Adult Children           Status of service:  Completed, signed off Medicare Important Message given?  YES (If response is "NO", the following Medicare IM given date fields will be blank) Date Medicare IM given:  02/02/2014 Medicare IM given by:  Sharrie Rothman Date Additional Medicare IM given:   Additional Medicare IM given by:    Discharge Disposition:  HOME W HOSPICE CARE  Per UR Regulation:    If discussed at Long Length of Stay Meetings, dates discussed:   02/02/2014    Comments:  02/02/14 1045 Abundio Teuscher, RN BSN CM Pt discharged home today with [facility]. CSW has arranged EMS transport. Pt has all DME needed.  02/01/14 1455 Arlyss Queen, RN BSN CM Pt referral sent to [facility] per family request and information faxed to them. Family prepared to take pt home 02/02/14 with Hospice.  01/28/14 1445 Arlyss Queen, RN BSN CM

## 2014-01-28 NOTE — Progress Notes (Signed)
Subjective: Interval History: none.  Objective: Vital signs in last 24 hours: Temp:  [97.8 F (36.6 C)-99.8 F (37.7 C)] 97.9 F (36.6 C) (09/24 0700) Pulse Rate:  [57-77] 57 (09/24 0700) Resp:  [12-19] 12 (09/24 0700) BP: (107-133)/(32-46) 107/34 mmHg (09/24 0700) SpO2:  [99 %-100 %] 100 % (09/24 0700) Weight change:   Intake/Output from previous day: 09/23 0701 - 09/24 0700 In: 3550 [I.V.:3250; IV Piggyback:300] Out: 525 [Urine:525] Intake/Output this shift:    General appearance: Patient very somnolent and presently non-communicative. Resp: diminished breath sounds anterior - bilateral and bilaterally and rhonchi anterior - bilateral Cardio: regular rate and rhythm, S1, S2 normal, no murmur, click, rub or gallop GI: soft, non-tender; bowel sounds normal; no masses,  no organomegaly Extremities: edema He has trace to 1+ edema, some chronic erythematous changes in bilateral pressure ulcer of both hills  Lab Results:  Recent Labs  01/26/14 0025 01/26/14 0458  WBC 9.1 8.6  HGB 9.1* 9.4*  HCT 28.3* 28.9*  PLT 218 243   BMET:  Recent Labs  01/27/14 0439 01/28/14 0441  NA 147 145  K 4.4 4.6  CL 112 112  CO2 21 20  GLUCOSE 113* 124*  BUN 62* 67*  CREATININE 2.49* 2.30*  CALCIUM 9.6 9.6   No results found for this basename: PTH,  in the last 72 hours Iron Studies: No results found for this basename: IRON, TIBC, TRANSFERRIN, FERRITIN,  in the last 72 hours  Studies/Results: US Renal  01/26/2014   CLINICAL DATA:  Renal insufficiency.  EXAM: RENAL/URINARY TRACT ULTRASOUND COMPLETE  COMPARISON:  Nephrolithiasis.  FINDINGS: Right Kidney:  Length: 10.5 cm. Echogenicity within normal limits. No mass or hydronephrosis visualized.  Left Kidney:  Length: 11.4 cm. Echogenicity within normal limits. No mass or hydronephrosis visualized. Left nephrolithiasis cannot be excluded .  Bladder:  Appears normal for degree of bladder distention. Catheters present in the bladder.  Bladder wall is thickened. This could be from nondistended state or bladder pathology.  IMPRESSION: 1. Left nephrolithiasis cannot be excluded. No left hydronephrosis or ureterectasis. 2. Foley catheter in the bladder. Bladder is nondistended. Bladder wall thickening is noted. This could be from nondistended state or bladder wall pathology.   Electronically Signed   By: Maisie Fus  Register   On: 01/26/2014 12:23    I have reviewed the patient's current medications.  Assessment/Plan: Problem #1 acute kidney injury: Possibly combination of ATN from sepsis/hypotension, renal and ARBS. Presently patient renal function seems to be improving. His creatinine still high at than his baseline.  Problem #2 chronic renal failure: Possibly stage III. Etiology is multifactorial including diabetes/hypertension/ischemic. Problem #3 diabetes Problem #4 respiratory failure/hypoxemia. Problem #5 altered mental status Problem #6 anemia: His hemoglobin and hematocrit is low. Etiology at this moment is not clear. Problem #7 malnutrition. His albumin is very low. His previous urinalysis didn't show any proteinuria. Problem #8 hypothyroidism  Plan: We'll continue his hydration Will start patient on Lasix 60 mg IV twice a day We'll check his iron studies in the morning We'll check his basic metabolic panel, phosphorus in the morning.   LOS: 3 days   Baxter Gonzalez S 01/28/2014,7:40 AM

## 2014-01-28 NOTE — Progress Notes (Signed)
Subjective: Patient is just opening his eyes. He remained on non breather mask in ICU. The family didn't NG tube insertion for medications and feeding. No fever or chills.  Objective: Vital signs in last 24 hours: Temp:  [97.7 F (36.5 C)-99.1 F (37.3 C)] 97.7 F (36.5 C) (09/24 1300) Pulse Rate:  [53-71] 67 (09/24 1300) Resp:  [12-19] 13 (09/24 1300) BP: (107-133)/(31-46) 115/42 mmHg (09/24 1300) SpO2:  [99 %-100 %] 100 % (09/24 1300) Weight change:  Last BM Date: 01/27/14  Intake/Output from previous day: 09/23 0701 - 09/24 0700 In: 3550 [I.V.:3250; IV Piggyback:300] Out: 525 [Urine:525]  PHYSICAL EXAM General appearance: delirious and mild distress Resp: diminished breath sounds bilaterally and rhonchi bilaterally Cardio: S1, S2 normal GI: soft, non-tender; bowel sounds normal; no masses,  no organomegaly Extremities: extremities normal, atraumatic, no cyanosis or edema  Lab Results:  Results for orders placed during the hospital encounter of 01/25/14 (from the past 48 hour(s))  GLUCOSE, CAPILLARY     Status: Abnormal   Collection Time    01/26/14  5:57 PM      Result Value Ref Range   Glucose-Capillary 159 (*) 70 - 99 mg/dL   Comment 1 Documented in Chart     Comment 2 Notify RN    GLUCOSE, CAPILLARY     Status: Abnormal   Collection Time    01/27/14 12:14 AM      Result Value Ref Range   Glucose-Capillary 127 (*) 70 - 99 mg/dL  BASIC METABOLIC PANEL     Status: Abnormal   Collection Time    01/27/14  4:39 AM      Result Value Ref Range   Sodium 147  137 - 147 mEq/L   Potassium 4.4  3.7 - 5.3 mEq/L   Chloride 112  96 - 112 mEq/L   CO2 21  19 - 32 mEq/L   Glucose, Bld 113 (*) 70 - 99 mg/dL   BUN 62 (*) 6 - 23 mg/dL   Creatinine, Ser 2.49 (*) 0.50 - 1.35 mg/dL   Calcium 9.6  8.4 - 10.5 mg/dL   GFR calc non Af Amer 23 (*) >90 mL/min   GFR calc Af Amer 27 (*) >90 mL/min   Comment: (NOTE)     The eGFR has been calculated using the CKD EPI equation.     This  calculation has not been validated in all clinical situations.     eGFR's persistently <90 mL/min signify possible Chronic Kidney     Disease.   Anion gap 14  5 - 15  GLUCOSE, CAPILLARY     Status: Abnormal   Collection Time    01/27/14  6:10 AM      Result Value Ref Range   Glucose-Capillary 137 (*) 70 - 99 mg/dL  GLUCOSE, CAPILLARY     Status: Abnormal   Collection Time    01/27/14 11:38 AM      Result Value Ref Range   Glucose-Capillary 146 (*) 70 - 99 mg/dL  GLUCOSE, CAPILLARY     Status: Abnormal   Collection Time    01/27/14  5:47 PM      Result Value Ref Range   Glucose-Capillary 117 (*) 70 - 99 mg/dL  GLUCOSE, CAPILLARY     Status: Abnormal   Collection Time    01/28/14 12:30 AM      Result Value Ref Range   Glucose-Capillary 144 (*) 70 - 99 mg/dL  BASIC METABOLIC PANEL     Status: Abnormal  Collection Time    01/28/14  4:41 AM      Result Value Ref Range   Sodium 145  137 - 147 mEq/L   Potassium 4.6  3.7 - 5.3 mEq/L   Chloride 112  96 - 112 mEq/L   CO2 20  19 - 32 mEq/L   Glucose, Bld 124 (*) 70 - 99 mg/dL   BUN 67 (*) 6 - 23 mg/dL   Creatinine, Ser 2.30 (*) 0.50 - 1.35 mg/dL   Calcium 9.6  8.4 - 10.5 mg/dL   GFR calc non Af Amer 26 (*) >90 mL/min   GFR calc Af Amer 30 (*) >90 mL/min   Comment: (NOTE)     The eGFR has been calculated using the CKD EPI equation.     This calculation has not been validated in all clinical situations.     eGFR's persistently <90 mL/min signify possible Chronic Kidney     Disease.   Anion gap 13  5 - 15  GLUCOSE, CAPILLARY     Status: Abnormal   Collection Time    01/28/14  6:19 AM      Result Value Ref Range   Glucose-Capillary 139 (*) 70 - 99 mg/dL  GLUCOSE, CAPILLARY     Status: Abnormal   Collection Time    01/28/14 11:16 AM      Result Value Ref Range   Glucose-Capillary 153 (*) 70 - 99 mg/dL    ABGS  Recent Labs  01/25/14 1632  PHART 7.388  PO2ART 61.8*  TCO2 21.6  HCO3 24.4*   CULTURES Recent Results  (from the past 240 hour(s))  CULTURE, BLOOD (ROUTINE X 2)     Status: None   Collection Time    01/25/14  3:35 PM      Result Value Ref Range Status   Specimen Description BLOOD RIGHT HAND   Final   Special Requests BOTTLES DRAWN AEROBIC AND ANAEROBIC 8CC   Final   Culture  Setup Time     Final   Value: 01/27/2014 00:46     Performed at [facility]   Culture     Final   Value: STAPHYLOCOCCUS SPECIES (COAGULASE NEGATIVE)     Note: THE SIGNIFICANCE OF ISOLATING THIS ORGANISM FROM A SINGLE VENIPUNCTURE CANNOT BE PREDICTED WITHOUT FURTHER CLINICAL AND CULTURE CORRELATION. SUSCEPTIBILITIES AVAILABLE ONLY ON REQUEST.     Note: Gram Stain Report Called to,Read Back By and Verified With: STONE A. AT 3875 ON 01/26/2014 BY Tyrone Schimke M. Performed at [facility]     Performed at [facility]   Report Status 01/28/2014 FINAL   Final  CULTURE, BLOOD (ROUTINE X 2)     Status: None   Collection Time    01/25/14  3:36 PM      Result Value Ref Range Status   Specimen Description BLOOD LEFT HAND   Final   Special Requests BOTTLES DRAWN AEROBIC ONLY 4CC   Final   Culture NO GROWTH 3 DAYS   Final   Report Status PENDING   Incomplete  URINE CULTURE     Status: None   Collection Time    01/25/14  4:15 PM      Result Value Ref Range Status   Specimen Description URINE, CATHETERIZED   Final   Special Requests NONE   Final   Culture  Setup Time     Final   Value: 01/25/2014 23:29     Performed at [place]  Final   Value: >=100,000 COLONIES/ML     Performed at [facility]   Culture     Final   Value: Multiple bacterial morphotypes present, none predominant. Suggest appropriate recollection if clinically indicated.     Performed at [facility]   Report Status 01/26/2014 FINAL   Final  MRSA PCR SCREENING     Status: None   Collection Time    01/25/14  6:12 PM      Result Value Ref Range Status   MRSA by PCR NEGATIVE  NEGATIVE Final    Comment:            The GeneXpert MRSA Assay (FDA     approved for NASAL specimens     only), is one component of a     comprehensive MRSA colonization     surveillance program. It is not     intended to diagnose MRSA     infection nor to guide or     monitor treatment for     MRSA infections.  CULTURE, EXPECTORATED SPUTUM-ASSESSMENT     Status: None   Collection Time    01/25/14  7:50 PM      Result Value Ref Range Status   Specimen Description SPUTUM   Final   Special Requests Normal   Final   Sputum evaluation     Final   Value: THIS SPECIMEN IS ACCEPTABLE. RESPIRATORY CULTURE REPORT TO FOLLOW.   Report Status 01/25/2014 FINAL   Final  CULTURE, RESPIRATORY (NON-EXPECTORATED)     Status: None   Collection Time    01/25/14  7:50 PM      Result Value Ref Range Status   Specimen Description TRACHEAL ASPIRATE   Final   Special Requests NONE   Final   Gram Stain     Final   Value: MODERATE WBC PRESENT,BOTH PMN AND MONONUCLEAR     RARE SQUAMOUS EPITHELIAL CELLS PRESENT     ABUNDANT GRAM POSITIVE COCCI     IN PAIRS IN CLUSTERS FEW GRAM NEGATIVE RODS     Performed at [facility]   Culture     Final   Value: NORMAL OROPHARYNGEAL FLORA     Performed at [facility]   Report Status 01/28/2014 FINAL   Final   Studies/Results: No results found.  Medications: I have reviewed the patient's current medications.  Assesment:   Active Problems:   HYPERTENSION   Diabetes mellitus, type 2   Acute on chronic renal failure   Dementia with behavioral disturbance   Chronic combined systolic and diastolic congestive heart failure   Respiratory failure with hypoxia   Sepsis   UTI (lower urinary tract infection)   Community acquired pneumonia   Pneumonia    Plan: Medications reviewed Will continue combination Iv antibiotics  Will continue iv fluid and oxygen therapy Nephrology consult appreciated Will monitor BMP Prognosis: poor.    LOS: 3 days    Kalasia Crafton 01/28/2014, 1:30 PM

## 2014-01-28 NOTE — Progress Notes (Signed)
Pt NT suctioned.  Pt tolerated well.  O2 sats remained 100 %.  Moderate amount thick white secretions.  RT will continue to monitor.

## 2014-01-29 LAB — GLUCOSE, CAPILLARY
GLUCOSE-CAPILLARY: 223 mg/dL — AB (ref 70–99)
Glucose-Capillary: 263 mg/dL — ABNORMAL HIGH (ref 70–99)
Glucose-Capillary: 218 mg/dL — ABNORMAL HIGH (ref 70–99)

## 2014-01-29 LAB — BASIC METABOLIC PANEL
ANION GAP: 11 (ref 5–15)
BUN: 66 mg/dL — ABNORMAL HIGH (ref 6–23)
CO2: 22 meq/L (ref 19–32)
CREATININE: 2.17 mg/dL — AB (ref 0.50–1.35)
Calcium: 9.4 mg/dL (ref 8.4–10.5)
Chloride: 111 mEq/L (ref 96–112)
GFR calc Af Amer: 32 mL/min — ABNORMAL LOW (ref >90)
GFR calc non Af Amer: 27 mL/min — ABNORMAL LOW (ref >90)
Glucose, Bld: 251 mg/dL — ABNORMAL HIGH (ref 70–99)
Potassium: 3.7 mEq/L (ref 3.7–5.3)
Sodium: 144 mEq/L (ref 137–147)

## 2014-01-29 LAB — IRON AND TIBC
Iron: 55 ug/dL (ref 42–135)
Saturation Ratios: 54 % (ref 20–55)
TIBC: 102 ug/dL — AB (ref 215–435)
UIBC: 47 ug/dL — ABNORMAL LOW (ref 125–400)

## 2014-01-29 LAB — FERRITIN: Ferritin: 785 ng/mL — ABNORMAL HIGH (ref 22–322)

## 2014-01-29 LAB — CLOSTRIDIUM DIFFICILE BY PCR: Toxigenic C. Difficile by PCR: NEGATIVE

## 2014-01-29 LAB — VANCOMYCIN, TROUGH: VANCOMYCIN TR: 24.4 ug/mL — AB (ref 10.0–20.0)

## 2014-01-29 LAB — PHOSPHORUS: PHOSPHORUS: 3.7 mg/dL (ref 2.3–4.6)

## 2014-01-29 MED ORDER — MORPHINE SULFATE 2 MG/ML IJ SOLN
1.0000 mg | INTRAMUSCULAR | Status: DC | PRN
  Administered 2014-01-29 – 2014-02-02 (×12): 1 mg via INTRAVENOUS
  Filled 2014-01-29 (×12): qty 1

## 2014-01-29 NOTE — Progress Notes (Signed)
Subjective: Interval History: none.  Objective: Vital signs in last 24 hours: Temp:  [97.1 F (36.2 C)-97.9 F (36.6 C)] 97.9 F (36.6 C) (09/25 0700) Pulse Rate:  [41-76] 50 (09/25 0800) Resp:  [12-24] 15 (09/25 0800) BP: (101-132)/(29-48) 105/34 mmHg (09/25 0800) SpO2:  [85 %-100 %] 100 % (09/25 0800) FiO2 (%):  [50 %] 50 % (09/25 0800) Weight change:   Intake/Output from previous day: 09/24 0701 - 09/25 0700 In: 4770.8 [I.V.:4420.8; IV Piggyback:350] Out: 1025 [Urine:1025] Intake/Output this shift: Total I/O In: 175 [I.V.:125; IV Piggyback:50] Out: -   Generally: Patient is arousable but does not communicate Chest decrease breath sound   but no wheezing Heart RRR no murmur Extremities: 1+ edema on the right and noe to trace on the left   Lab Results: No results found for this basename: WBC, HGB, HCT, PLT,  in the last 72 hours BMET:   Recent Labs  01/28/14 0441 01/29/14 0431  NA 145 144  K 4.6 3.7  CL 112 111  CO2 20 22  GLUCOSE 124* 251*  BUN 67* 66*  CREATININE 2.30* 2.17*  CALCIUM 9.6 9.4   No results found for this basename: PTH,  in the last 72 hours Iron Studies: No results found for this basename: IRON, TIBC, TRANSFERRIN, FERRITIN,  in the last 72 hours  Studies/Results: No results found.  I have reviewed the patient's current medications.  Assessment/Plan: Problem #1 acute kidney injury: Possibly combination of ATN from sepsis/hypotension, renal and ARBS. None oliguric and his renal function is improving Problem #2 chronic renal failure: Possibly stage III. Etiology is multifactorial including diabetes/hypertension/ischemic. Problem #3 diabetes Problem #4 respiratory failure/hypoxemia.His on none breather mask and his o2 saturation is 98% Problem #5 altered mental status. Patient is still sleepy but wakes up when called but does not answer questions. Problem #6 anemia: His hemoglobin and hematocrit is low. Etiology at this moment is not  clear. Problem #7 malnutrition. His albumin is very low.  Problem #8 hypothyroidism Problem#9 Metabolic bone disease his calcium and phosphorus is in range Plan: We'll continue with present treatment We'll check his basic metabolic panel in the morning.   LOS: 4 days   March Steyer S 01/29/2014,9:25 AM

## 2014-01-29 NOTE — Progress Notes (Signed)
ANTIBIOTIC CONSULT NOTE - follow up  Pharmacy Consult for Vancomycin & Zosyn Indication: rule out sepsis  No Known Allergies  Patient Measurements: Height:  (162.6 cm) Weight: 181 lb 14.1 oz (82.5 kg) IBW/kg (Calculated) : 59.2  Vital Signs: Temp: 97.9 F (36.6 C) (09/25 0700) Temp src: Axillary (09/25 0700) BP: 105/34 mmHg (09/25 0800) Pulse Rate: 50 (09/25 0800) Intake/Output from previous day: 09/24 0701 - 09/25 0700 In: 4770.8 [I.V.:4420.8; IV Piggyback:350] Out: 1025 [Urine:1025] Intake/Output from this shift: Total I/O In: 175 [I.V.:125; IV Piggyback:50] Out: -   Labs:  Recent Labs  01/27/14 0439 01/28/14 0441 01/29/14 0431  CREATININE 2.49* 2.30* 2.17*   Estimated Creatinine Clearance: 27.2 ml/min (by C-G formula based on Cr of 2.17).  Recent Labs  01/29/14 1406  VANCOTROUGH 24.4*     Microbiology: Recent Results (from the past 720 hour(s))  CULTURE, BLOOD (ROUTINE X 2)     Status: None   Collection Time    01/25/14  3:35 PM      Result Value Ref Range Status   Specimen Description BLOOD RIGHT HAND   Final   Special Requests BOTTLES DRAWN AEROBIC AND ANAEROBIC 8CC   Final   Culture  Setup Time     Final   Value: 01/27/2014 00:46     Performed at [facility]   Culture     Final   Value: STAPHYLOCOCCUS SPECIES (COAGULASE NEGATIVE)     Note: THE SIGNIFICANCE OF ISOLATING THIS ORGANISM FROM A SINGLE VENIPUNCTURE CANNOT BE PREDICTED WITHOUT FURTHER CLINICAL AND CULTURE CORRELATION. SUSCEPTIBILITIES AVAILABLE ONLY ON REQUEST.     Note: Gram Stain Report Called to,Read Back By and Verified With: STONE A. AT 1854 ON 01/26/2014 BY Luetta Nutting M. Performed at [facility]     Performed at [facility]   Report Status 01/28/2014 FINAL   Final  CULTURE, BLOOD (ROUTINE X 2)     Status: None   Collection Time    01/25/14  3:36 PM      Result Value Ref Range Status   Specimen Description BLOOD LEFT HAND   Final   Special Requests  BOTTLES DRAWN AEROBIC ONLY 4CC   Final   Culture NO GROWTH 4 DAYS   Final   Report Status PENDING   Incomplete  URINE CULTURE     Status: None   Collection Time    01/25/14  4:15 PM      Result Value Ref Range Status   Specimen Description URINE, CATHETERIZED   Final   Special Requests NONE   Final   Culture  Setup Time     Final   Value: 01/25/2014 23:29     Performed at [facility] Count     Final   Value: >=100,000 COLONIES/ML     Performed at [facility]   Culture     Final   Value: Multiple bacterial morphotypes present, none predominant. Suggest appropriate recollection if clinically indicated.     Performed at [facility]   Report Status 01/26/2014 FINAL   Final  MRSA PCR SCREENING     Status: None   Collection Time    01/25/14  6:12 PM      Result Value Ref Range Status   MRSA by PCR NEGATIVE  NEGATIVE Final   Comment:            The GeneXpert MRSA Assay (FDA     approved for NASAL specimens  only), is one component of a     comprehensive MRSA colonization     surveillance program. It is not     intended to diagnose MRSA     infection nor to guide or     monitor treatment for     MRSA infections.  CULTURE, EXPECTORATED SPUTUM-ASSESSMENT     Status: None   Collection Time    01/25/14  7:50 PM      Result Value Ref Range Status   Specimen Description SPUTUM   Final   Special Requests Normal   Final   Sputum evaluation     Final   Value: THIS SPECIMEN IS ACCEPTABLE. RESPIRATORY CULTURE REPORT TO FOLLOW.   Report Status 01/25/2014 FINAL   Final  CULTURE, RESPIRATORY (NON-EXPECTORATED)     Status: None   Collection Time    01/25/14  7:50 PM      Result Value Ref Range Status   Specimen Description TRACHEAL ASPIRATE   Final   Special Requests NONE   Final   Gram Stain     Final   Value: MODERATE WBC PRESENT,BOTH PMN AND MONONUCLEAR     RARE SQUAMOUS EPITHELIAL CELLS PRESENT     ABUNDANT GRAM POSITIVE COCCI     IN PAIRS IN  CLUSTERS FEW GRAM NEGATIVE RODS     Performed at [facility]   Culture     Final   Value: NORMAL OROPHARYNGEAL FLORA     Performed at [facility]   Report Status 01/28/2014 FINAL   Final   Medical History: Past Medical History  Diagnosis Date  . Coronary atherosclerosis of native coronary artery 2000    CABG 2000 following acute MI - refused followup cardiac catheterization 2012  . Gout   . Hypothyroidism   . Seizures   . Ventricular tachycardia     On amiodarone  . Diabetes mellitus, type 2   . Cerebrovascular disease 2004    Left cerebral CVA followed by carotid endarterectomy in [place]  . Essential hypertension, benign   . Gastritis 2004    Gastric erosions  . Hyperlipidemia   . Ischemic cardiomyopathy     LVEF 45% 2013   Medications:  Scheduled:  . antiseptic oral rinse  7 mL Mouth Rinse BID  . furosemide  60 mg Intravenous BID  . heparin  5,000 Units Subcutaneous 3 times per day  . Influenza vac split quadrivalent PF  0.5 mL Intramuscular Tomorrow-1000  . insulin aspart  0-9 Units Subcutaneous Q6H  . phenytoin (DILANTIN) IV  100 mg Intravenous 4 times per day  . piperacillin-tazobactam (ZOSYN)  IV  3.375 g Intravenous Q8H  . pneumococcal 23 valent vaccine  0.5 mL Intramuscular Tomorrow-1000   Assessment: Vancomycin & Zosyn pharmacy protocol for sepsis. Culture results as above.  Tracheal aspirate with GPC. Estimated Creatinine Clearance: 27.2 ml/min (by C-G formula based on Cr of 2.17).  Vancomycin trough above goal  Goal of Therapy:  Vancomycin trough level 15-20 mcg/ml  Plan:  Hold Vancomycin dosing Vancomycin random level in AM Pharmacy to redose Vancomycin when Vancomycin level in acceptable range Continue Zosyn 3.375 GM IV every 8 hours, each dose over 4 hours Monitor renal function Labs per protocol  [name], Nitisha Civello Bennett 01/29/2014,3:29 PM

## 2014-01-29 NOTE — Evaluation (Addendum)
Clinical/Bedside Swallow Evaluation Patient Details  Name: Donald Owen MRN: [id number] Date of Birth: 10-May-1933  Today's Date: 01/29/2014 Time: 1700-1717 SLP Time Calculation (min): 17 min  Past Medical History:  Past Medical History  Diagnosis Date  . Coronary atherosclerosis of native coronary artery 2000    CABG 2000 following acute MI - refused followup cardiac catheterization 2012  . Gout   . Hypothyroidism   . Seizures   . Ventricular tachycardia     On amiodarone  . Diabetes mellitus, type 2   . Cerebrovascular disease 2004    Left cerebral CVA followed by carotid endarterectomy in [place]  . Essential hypertension, benign   . Gastritis 2004    Gastric erosions  . Hyperlipidemia   . Ischemic cardiomyopathy     LVEF 45% 2013   Past Surgical History:  Past Surgical History  Procedure Laterality Date  . Coronary artery bypass graft  04/1999    Dr. Barry Dienes  . Carotid endarterectomy    . Cholecystectomy     HPI:  Pt is a 80 y.o. male admitted 9/21 with fever of 101 and hypotensive/ hypoxiemic; AMS and decreased appetite for the past week. Hx of dementia. CXR 9/21 revealed RLL PNA. Pt has been NPO since admission and 100% non-rebreather. Poor prognosis. SLP bedside swallow eval ordered to assess swallow function and aide in r/o of aspiration PNA.   Assessment / Plan / Recommendation Clinical Impression  The pt demonstrated immediate s/s of silent aspiration following trials of thin liquid- wet vocal quality followed by weak throat clear when cued to cough. Pt did swallow small amount of puree consistency with cues to maintain alertness but then spat most of it out. Suspect delayed swallow initiation across consistencies. With trials of ice chips pt showed no overt s/s of aspiration but did require cueing to maintain alert state and verbal cues to swallow. The pt is currently at high risk of aspiration- risk factors include the s/s noted at bedside, altered mental status,  current RLL PNA, and respiratory status as pt is on non-rebreather. Recommend that pt remain NPO with meds via alternative means. Allow small amount of ice chips following thorough oral care if pt is alert. Speech therapy will continue to follow for PO trials. Pt may be able to tolerate additional consistencies as altered mental status improves. Will continue to follow.     Aspiration Risk  Severe    Diet Recommendation NPO;Ice chips PRN after oral care   Medication Administration: Via alternative means Postural Changes and/or Swallow Maneuvers: Seated upright 90 degrees    Other  Recommendations Oral Care Recommendations: Oral care prior to ice chips;Oral care Q4 per protocol   Follow Up Recommendations  24 hour supervision/assistance    Frequency and Duration min 2x/week  2 weeks   Pertinent Vitals/Pain n/a    SLP Swallow Goals     Swallow Study Prior Functional Status       General HPI: Pt is a 80 y.o. male admitted 9/21 with fever of 101 and hypotensive/ hypoxiemic; AMS and decreased appetite for the past week. Hx of dementia. CXR 9/21 revealed RLL PNA. Pt has been NPO since admission and 100% non-rebreather. Poor prognosis. SLP bedside swallow eval ordered to assess swallow function and aide in r/o of aspiration PNA. Type of Study: Bedside swallow evaluation Diet Prior to this Study: NPO Temperature Spikes Noted: No Respiratory Status: non-rebreather History of Recent Intubation: No Behavior/Cognition: Alert;Requires cueing Oral Cavity - Dentition: Edentulous Self-Feeding Abilities: Total  assist Patient Positioning: Upright in bed Baseline Vocal Quality: Hoarse Volitional Cough: Cognitively unable to elicit Volitional Swallow: Unable to elicit    Oral/Motor/Sensory Function Overall Oral Motor/Sensory Function:  (difficult to assess- not following commands)   Ice Chips Ice chips: Impaired Presentation: Spoon Oral Phase Impairments: Reduced labial seal;Poor awareness  of bolus Oral Phase Functional Implications: Oral holding (needs cues to maintain alert state to swallow) Pharyngeal Phase Impairments: Suspected delayed Swallow   Thin Liquid Thin Liquid: Impaired Presentation: Straw Oral Phase Impairments: Reduced labial seal Pharyngeal  Phase Impairments: Suspected delayed Swallow;Wet Vocal Quality    Nectar Thick Nectar Thick Liquid: Not tested   Honey Thick Honey Thick Liquid: Not tested   Puree Puree: Impaired Presentation: Spoon Oral Phase Impairments: Reduced labial seal;Poor awareness of bolus Oral Phase Functional Implications: Oral residue;Oral holding Pharyngeal Phase Impairments: Suspected delayed Swallow   Solid   GO    Solid: Not tested       Metro Kung, MA, CCC-SLP 01/29/2014,5:32 PM

## 2014-01-29 NOTE — Progress Notes (Signed)
Subjective: Patient is more alert today. He is making ey contact, however, remain very weak and fragile. His renal function and urine out put is improving. Objective: Vital signs in last 24 hours: Temp:  [97.1 F (36.2 C)-97.9 F (36.6 C)] 97.9 F (36.6 C) (09/25 0700) Pulse Rate:  [41-76] 50 (09/25 0800) Resp:  [12-24] 15 (09/25 0800) BP: (101-132)/(29-48) 105/34 mmHg (09/25 0800) SpO2:  [85 %-100 %] 100 % (09/25 0800) FiO2 (%):  [50 %] 50 % (09/25 0800) Weight change:  Last BM Date: 01/27/14  Intake/Output from previous day: 09/24 0701 - 09/25 0700 In: 4770.8 [I.V.:4420.8; IV Piggyback:350] Out: 1025 [Urine:1025]  PHYSICAL EXAM General appearance: delirious and mild distress Resp: diminished breath sounds bilaterally and rhonchi bilaterally Cardio: S1, S2 normal GI: soft, non-tender; bowel sounds normal; no masses,  no organomegaly Extremities: extremities normal, atraumatic, no cyanosis or edema  Lab Results:  Results for orders placed during the hospital encounter of 01/25/14 (from the past 48 hour(s))  GLUCOSE, CAPILLARY     Status: Abnormal   Collection Time    01/27/14 11:38 AM      Result Value Ref Range   Glucose-Capillary 146 (*) 70 - 99 mg/dL  GLUCOSE, CAPILLARY     Status: Abnormal   Collection Time    01/27/14  5:47 PM      Result Value Ref Range   Glucose-Capillary 117 (*) 70 - 99 mg/dL  GLUCOSE, CAPILLARY     Status: Abnormal   Collection Time    01/28/14 12:30 AM      Result Value Ref Range   Glucose-Capillary 144 (*) 70 - 99 mg/dL  BASIC METABOLIC PANEL     Status: Abnormal   Collection Time    01/28/14  4:41 AM      Result Value Ref Range   Sodium 145  137 - 147 mEq/L   Potassium 4.6  3.7 - 5.3 mEq/L   Chloride 112  96 - 112 mEq/L   CO2 20  19 - 32 mEq/L   Glucose, Bld 124 (*) 70 - 99 mg/dL   BUN 67 (*) 6 - 23 mg/dL   Creatinine, Ser 2.30 (*) 0.50 - 1.35 mg/dL   Calcium 9.6  8.4 - 10.5 mg/dL   GFR calc non Af Amer 26 (*) >90 mL/min   GFR  calc Af Amer 30 (*) >90 mL/min   Comment: (NOTE)     The eGFR has been calculated using the CKD EPI equation.     This calculation has not been validated in all clinical situations.     eGFR's persistently <90 mL/min signify possible Chronic Kidney     Disease.   Anion gap 13  5 - 15  GLUCOSE, CAPILLARY     Status: Abnormal   Collection Time    01/28/14  6:19 AM      Result Value Ref Range   Glucose-Capillary 139 (*) 70 - 99 mg/dL  GLUCOSE, CAPILLARY     Status: Abnormal   Collection Time    01/28/14 11:16 AM      Result Value Ref Range   Glucose-Capillary 153 (*) 70 - 99 mg/dL  GLUCOSE, CAPILLARY     Status: Abnormal   Collection Time    01/28/14  5:39 PM      Result Value Ref Range   Glucose-Capillary 181 (*) 70 - 99 mg/dL  GLUCOSE, CAPILLARY     Status: Abnormal   Collection Time    01/28/14 11:45 PM  Result Value Ref Range   Glucose-Capillary 208 (*) 70 - 99 mg/dL  BASIC METABOLIC PANEL     Status: Abnormal   Collection Time    01/29/14  4:31 AM      Result Value Ref Range   Sodium 144  137 - 147 mEq/L   Potassium 3.7  3.7 - 5.3 mEq/L   Comment: DELTA CHECK NOTED   Chloride 111  96 - 112 mEq/L   CO2 22  19 - 32 mEq/L   Glucose, Bld 251 (*) 70 - 99 mg/dL   BUN 66 (*) 6 - 23 mg/dL   Creatinine, Ser 2.17 (*) 0.50 - 1.35 mg/dL   Calcium 9.4  8.4 - 10.5 mg/dL   GFR calc non Af Amer 27 (*) >90 mL/min   GFR calc Af Amer 32 (*) >90 mL/min   Comment: (NOTE)     The eGFR has been calculated using the CKD EPI equation.     This calculation has not been validated in all clinical situations.     eGFR's persistently <90 mL/min signify possible Chronic Kidney     Disease.   Anion gap 11  5 - 15  PHOSPHORUS     Status: None   Collection Time    01/29/14  4:31 AM      Result Value Ref Range   Phosphorus 3.7  2.3 - 4.6 mg/dL  GLUCOSE, CAPILLARY     Status: Abnormal   Collection Time    01/29/14  5:43 AM      Result Value Ref Range   Glucose-Capillary 263 (*) 70 - 99  mg/dL    ABGS No results found for this basename: PHART, PCO2, PO2ART, TCO2, HCO3,  in the last 72 hours CULTURES Recent Results (from the past 240 hour(s))  CULTURE, BLOOD (ROUTINE X 2)     Status: None   Collection Time    01/25/14  3:35 PM      Result Value Ref Range Status   Specimen Description BLOOD RIGHT HAND   Final   Special Requests BOTTLES DRAWN AEROBIC AND ANAEROBIC 8CC   Final   Culture  Setup Time     Final   Value: 01/27/2014 00:46     Performed at [facility]   Culture     Final   Value: STAPHYLOCOCCUS SPECIES (COAGULASE NEGATIVE)     Note: THE SIGNIFICANCE OF ISOLATING THIS ORGANISM FROM A SINGLE VENIPUNCTURE CANNOT BE PREDICTED WITHOUT FURTHER CLINICAL AND CULTURE CORRELATION. SUSCEPTIBILITIES AVAILABLE ONLY ON REQUEST.     Note: Gram Stain Report Called to,Read Back By and Verified With: STONE A. AT 1749 ON 01/26/2014 BY Tyrone Schimke M. Performed at [facility]     Performed at [facility]   Report Status 01/28/2014 FINAL   Final  CULTURE, BLOOD (ROUTINE X 2)     Status: None   Collection Time    01/25/14  3:36 PM      Result Value Ref Range Status   Specimen Description BLOOD LEFT HAND   Final   Special Requests BOTTLES DRAWN AEROBIC ONLY 4CC   Final   Culture NO GROWTH 3 DAYS   Final   Report Status PENDING   Incomplete  URINE CULTURE     Status: None   Collection Time    01/25/14  4:15 PM      Result Value Ref Range Status   Specimen Description URINE, CATHETERIZED   Final   Special Requests NONE   Final  Culture  Setup Time     Final   Value: 01/25/2014 23:29     Performed at [facility]     Final   Value: >=100,000 COLONIES/ML     Performed at [facility]   Culture     Final   Value: Multiple bacterial morphotypes present, none predominant. Suggest appropriate recollection if clinically indicated.     Performed at [facility]   Report Status 01/26/2014 FINAL   Final  MRSA PCR  SCREENING     Status: None   Collection Time    01/25/14  6:12 PM      Result Value Ref Range Status   MRSA by PCR NEGATIVE  NEGATIVE Final   Comment:            The GeneXpert MRSA Assay (FDA     approved for NASAL specimens     only), is one component of a     comprehensive MRSA colonization     surveillance program. It is not     intended to diagnose MRSA     infection nor to guide or     monitor treatment for     MRSA infections.  CULTURE, EXPECTORATED SPUTUM-ASSESSMENT     Status: None   Collection Time    01/25/14  7:50 PM      Result Value Ref Range Status   Specimen Description SPUTUM   Final   Special Requests Normal   Final   Sputum evaluation     Final   Value: THIS SPECIMEN IS ACCEPTABLE. RESPIRATORY CULTURE REPORT TO FOLLOW.   Report Status 01/25/2014 FINAL   Final  CULTURE, RESPIRATORY (NON-EXPECTORATED)     Status: None   Collection Time    01/25/14  7:50 PM      Result Value Ref Range Status   Specimen Description TRACHEAL ASPIRATE   Final   Special Requests NONE   Final   Gram Stain     Final   Value: MODERATE WBC PRESENT,BOTH PMN AND MONONUCLEAR     RARE SQUAMOUS EPITHELIAL CELLS PRESENT     ABUNDANT GRAM POSITIVE COCCI     IN PAIRS IN CLUSTERS FEW GRAM NEGATIVE RODS     Performed at [facility]   Culture     Final   Value: NORMAL OROPHARYNGEAL FLORA     Performed at [facility]   Report Status 01/28/2014 FINAL   Final   Studies/Results: No results found.  Medications: I have reviewed the patient's current medications.  Assesment:   Active Problems:   HYPERTENSION   Diabetes mellitus, type 2   Acute on chronic renal failure   Dementia with behavioral disturbance   Chronic combined systolic and diastolic congestive heart failure   Respiratory failure with hypoxia   Sepsis   UTI (lower urinary tract infection)   Community acquired pneumonia   Pneumonia    Plan: Medications reviewed Will continue combination Iv antibiotics   Continue iv fluid according to nephrology recommendation Bed side swallowing test Prognosis: poor.    LOS: 4 days   Donald Owen 01/29/2014, 8:25 AM

## 2014-01-29 NOTE — Progress Notes (Signed)
Patient with unstageable sacral wound necrotic and bleeding on the edges, with multiple liquid black/brown stools.  After 3rd bout, MD notified for flexiseal and placed.  Patient transferred onto sizewise bed for wound management with alternating set at every 3 minute turns, new sacral dressing applied and hygiene kept.  Patient complained of abdominal pain, does have a 4mm nonobstructing stone in left kidney, unsure of reason for pain in patient with dementia.  Notified MD and 1 mg IV morphine given.

## 2014-01-29 NOTE — Progress Notes (Signed)
NUTRITION FOLLOW UP  Intervention:   RD to follow for diet advancement and goals of care  Nutrition Dx:   Inadequate oral intake related to inability to eat as evidenced by NPO; ongoing  Goal:   Pt will meet nutrition needs as able  Monitor:   Diet advancement, PO intake, labs, weight changes, I/O's, goals of care  Assessment:   This is a 80 year old man who is unable to give a history because he has become extremely sick in the last few days. He has decreased mental status over the last couple of days. He has been noted to have a fever of 101 today. He has not been eating and drinking for the last week. He has a history of dementia and for the last 3 months he is really not been able to walk independently. When he is well, he is able to verbalize and feed himself.  When he presented to the emergency room, he was moribund, hypotensive, hypoxemic requiring increasing amounts of supplemental oxygen. Chart reviewed. Pt continues to be very weak, but AMS is improving.  He remains NPO. He is receiving medications via IV. Family has refused NGT placement for meds and nutrition. SLP evaluation has been ordered. He has been requiring suctioning due to thick secretions.  Noted 8# wt loss over the past 4 days, likely due to fluid retention. Pt is receiving IV Lasix.  Prognosis continues to be poor. Possible transition to hospice/comfort care measures.   Height: Ht Readings from Last 1 Encounters:  01/25/14  (1.626 m)    Weight Status:   Wt Readings from Last 1 Encounters:  01/27/14 181 lb 14.1 oz (82.5 kg)  01/26/14  173 lb 15.1 oz (78.9 kg)   Re-estimated needs:  Kcal: 2400-2600 Protein: 107-117 grams Fluid: 2.4-2.6 L  Skin: stage III pressure ulcer posterior mid sacrum, deep tissue injury on rt heel, deep tissue injury on lt heel, deep tissue injury on rt lateral foot   Diet Order: NPO   Intake/Output Summary (Last 24 hours) at 01/29/14 1045 Last data filed at 01/29/14 0806  Gross per 24 hour  Intake   2925 ml  Output    900 ml  Net   2025 ml    Last BM: 01/27/14   Labs:   Recent Labs Lab 01/27/14 0439 01/28/14 0441 01/29/14 0431  NA 147 145 144  K 4.4 4.6 3.7  CL 112 112 111  CO2 BUN 62* 67* 66*  CREATININE 2.49* 2.30* 2.17*  CALCIUM 9.6 9.6 9.4  PHOS  --   --  3.7  GLUCOSE 113* 124* 251*    CBG (last 3)   Recent Labs  01/28/14 1739 01/28/14 2345 01/29/14 0543  GLUCAP 181* 208* 263*    Scheduled Meds: . antiseptic oral rinse  7 mL Mouth Rinse BID  . furosemide  60 mg Intravenous BID  . heparin  5,000 Units Subcutaneous 3 times per day  . Influenza vac split quadrivalent PF  0.5 mL Intramuscular Tomorrow-1000  . insulin aspart  0-9 Units Subcutaneous Q6H  . phenytoin (DILANTIN) IV  100 mg Intravenous 4 times per day  . piperacillin-tazobactam (ZOSYN)  IV  3.375 g Intravenous Q8H  . pneumococcal 23 valent vaccine  0.5 mL Intramuscular Tomorrow-1000  . vancomycin  1,000 mg Intravenous Q24H    Continuous Infusions: . dextrose 5 % and 0.45% NaCl 125 mL/hr at 01/28/14 2100    Scotti Kosta A. Mayford Knife, RD, LDN Pager: [phone]

## 2014-01-30 LAB — BASIC METABOLIC PANEL
Anion gap: 13 (ref 5–15)
BUN: 59 mg/dL — ABNORMAL HIGH (ref 6–23)
CALCIUM: 9.2 mg/dL (ref 8.4–10.5)
CO2: 21 mEq/L (ref 19–32)
Chloride: 109 mEq/L (ref 96–112)
Creatinine, Ser: 1.98 mg/dL — ABNORMAL HIGH (ref 0.50–1.35)
GFR calc non Af Amer: 31 mL/min — ABNORMAL LOW (ref >90)
GFR, EST AFRICAN AMERICAN: 35 mL/min — AB (ref >90)
GLUCOSE: 233 mg/dL — AB (ref 70–99)
Potassium: 3.1 mEq/L — ABNORMAL LOW (ref 3.7–5.3)
SODIUM: 143 meq/L (ref 137–147)

## 2014-01-30 LAB — GLUCOSE, CAPILLARY
GLUCOSE-CAPILLARY: 179 mg/dL — AB (ref 70–99)
GLUCOSE-CAPILLARY: 198 mg/dL — AB (ref 70–99)
GLUCOSE-CAPILLARY: 207 mg/dL — AB (ref 70–99)
Glucose-Capillary: 214 mg/dL — ABNORMAL HIGH (ref 70–99)
Glucose-Capillary: 174 mg/dL — ABNORMAL HIGH (ref 70–99)

## 2014-01-30 LAB — CULTURE, BLOOD (ROUTINE X 2): Culture: NO GROWTH

## 2014-01-30 LAB — VANCOMYCIN, RANDOM: Vancomycin Rm: 21.8 ug/mL

## 2014-01-30 MED ORDER — POTASSIUM CHLORIDE 2 MEQ/ML IV SOLN
INTRAVENOUS | Status: DC
  Administered 2014-01-30 – 2014-02-02 (×6): via INTRAVENOUS
  Filled 2014-01-30 (×8): qty 1000

## 2014-01-30 NOTE — Progress Notes (Signed)
Subjective: Interval History: none.  Objective: Vital signs in last 24 hours: Temp:  [96.2 F (35.7 C)-97.7 F (36.5 C)] 96.2 F (35.7 C) (09/26 0800) Pulse Rate:  [35-81] 51 (09/26 0800) Resp:  [9-18] 16 (09/26 0800) BP: (71-134)/(23-63) 125/44 mmHg (09/26 0800) SpO2:  [95 %-100 %] 100 % (09/26 0800) FiO2 (%):  [40 %] 40 % (09/26 0746) Weight:  [82.7 kg (182 lb 5.1 oz)] 82.7 kg (182 lb 5.1 oz) (09/26 0600) Weight change:   Intake/Output from previous day: 09/25 0701 - 09/26 0700 In: 3029 [I.V.:2875; IV Piggyback:154] Out: 1090 [Urine:1090] Intake/Output this shift: Total I/O In: 50 [IV Piggyback:50] Out: -   Generally: Patient still sleepy but arousable Chest decrease breath sound   but no wheezing,rales or rhonchi Heart RRR no murmur Extremities: trace edema on the right and none ton the left   Lab Results: No results found for this basename: WBC, HGB, HCT, PLT,  in the last 72 hours BMET:   Recent Labs  01/29/14 0431 01/30/14 0520  NA 144 143  K 3.7 3.1*  CL 111 109  CO2 22 21  GLUCOSE 251* 233*  BUN 66* 59*  CREATININE 2.17* 1.98*  CALCIUM 9.4 9.2   No results found for this basename: PTH,  in the last 72 hours Iron Studies:   Recent Labs  01/29/14 0431  IRON 55  TIBC 102*  FERRITIN 785*    Studies/Results: No results found.  I have reviewed the patient's current medications.  Assessment/Plan: Problem #1 acute kidney injury: Possibly combination of ATN from sepsis/hypotension, renal and ARBS. None oliguric and his renal function continue to improve but still above his base line Problem #2 chronic renal failure: Possibly stage III. Etiology is multifactorial including diabetes/hypertension/ischemic. Problem #3 diabetes Problem #4 respiratory failure/hypoxemia. Problem #5 altered mental status. Patient is still sleepy but wakes up when called but does not answer questions. Problem #6 anemia: His hemoglobin and hematocrit is low. Etiology seems  to be secondary to chronic diseases including renal failure Problem #7 Hypokalemia: His potassium is low most likely from duiretic Problem #8 hypothyroidism Problem#9 Metabolic bone disease his calcium and phosphorus is in range Plan: We'll continue with present treatment We'll check his basic metabolic panel in the morning.   LOS: 5 days   Jonisha Kindig S 01/30/2014,9:27 AM

## 2014-01-30 NOTE — Progress Notes (Signed)
Subjective: Patient is resting. He is more awake and alert. He is just taking ice chips and sips of water. Objective: Vital signs in last 24 hours: Temp:  [96.2 F (35.7 C)-97.7 F (36.5 C)] 96.2 F (35.7 C) (09/26 0800) Pulse Rate:  [35-81] 51 (09/26 0800) Resp:  [9-18] 16 (09/26 0800) BP: (71-134)/(23-63) 125/44 mmHg (09/26 0800) SpO2:  [95 %-100 %] 100 % (09/26 0800) FiO2 (%):  [40 %] 40 % (09/26 0746) Weight:  [82.7 kg (182 lb 5.1 oz)] 82.7 kg (182 lb 5.1 oz) (09/26 0600) Weight change:  Last BM Date: 01/30/14  Intake/Output from previous day: 09/25 0701 - 09/26 0700 In: 3029 [I.V.:2875; IV Piggyback:154] Out: 1090 [Urine:1090]  PHYSICAL EXAM General appearance: delirious and mild distress Resp: diminished breath sounds bilaterally and rhonchi bilaterally Cardio: S1, S2 normal GI: soft, non-tender; bowel sounds normal; no masses,  no organomegaly Extremities: extremities normal, atraumatic, no cyanosis or edema  Lab Results:  Results for orders placed during the hospital encounter of 01/25/14 (from the past 48 hour(s))  GLUCOSE, CAPILLARY     Status: Abnormal   Collection Time    01/28/14 11:16 AM      Result Value Ref Range   Glucose-Capillary 153 (*) 70 - 99 mg/dL  GLUCOSE, CAPILLARY     Status: Abnormal   Collection Time    01/28/14  5:39 PM      Result Value Ref Range   Glucose-Capillary 181 (*) 70 - 99 mg/dL  GLUCOSE, CAPILLARY     Status: Abnormal   Collection Time    01/28/14 11:45 PM      Result Value Ref Range   Glucose-Capillary 208 (*) 70 - 99 mg/dL  BASIC METABOLIC PANEL     Status: Abnormal   Collection Time    01/29/14  4:31 AM      Result Value Ref Range   Sodium 144  137 - 147 mEq/L   Potassium 3.7  3.7 - 5.3 mEq/L   Comment: DELTA CHECK NOTED   Chloride 111  96 - 112 mEq/L   CO2 22  19 - 32 mEq/L   Glucose, Bld 251 (*) 70 - 99 mg/dL   BUN 66 (*) 6 - 23 mg/dL   Creatinine, Ser 2.17 (*) 0.50 - 1.35 mg/dL   Calcium 9.4  8.4 - 10.5 mg/dL    GFR calc non Af Amer 27 (*) >90 mL/min   GFR calc Af Amer 32 (*) >90 mL/min   Comment: (NOTE)     The eGFR has been calculated using the CKD EPI equation.     This calculation has not been validated in all clinical situations.     eGFR's persistently <90 mL/min signify possible Chronic Kidney     Disease.   Anion gap 11  5 - 15  IRON AND TIBC     Status: Abnormal   Collection Time    01/29/14  4:31 AM      Result Value Ref Range   Iron 55  42 - 135 ug/dL   TIBC 102 (*) 215 - 435 ug/dL   Saturation Ratios 54  20 - 55 %   UIBC 47 (*) 125 - 400 ug/dL   Comment: Performed at [place]: Abnormal   Collection Time    01/29/14  4:31 AM      Result Value Ref Range   Ferritin 785 (*) 22 - 322 ng/mL   Comment: Performed at [facility]  PHOSPHORUS     Status: None   Collection Time    01/29/14  4:31 AM      Result Value Ref Range   Phosphorus 3.7  2.3 - 4.6 mg/dL  GLUCOSE, CAPILLARY     Status: Abnormal   Collection Time    01/29/14  5:43 AM      Result Value Ref Range   Glucose-Capillary 263 (*) 70 - 99 mg/dL  GLUCOSE, CAPILLARY     Status: Abnormal   Collection Time    01/29/14 11:20 AM      Result Value Ref Range   Glucose-Capillary 223 (*) 70 - 99 mg/dL   Comment 1 Notify RN    VANCOMYCIN, [place]     Status: Abnormal   Collection Time    01/29/14  2:06 PM      Result Value Ref Range   Vancomycin Tr 24.4 (*) 10.0 - 20.0 ug/mL  GLUCOSE, CAPILLARY     Status: Abnormal   Collection Time    01/29/14  5:23 PM      Result Value Ref Range   Glucose-Capillary 218 (*) 70 - 99 mg/dL   Comment 1 Notify RN    CLOSTRIDIUM DIFFICILE BY PCR     Status: None   Collection Time    01/29/14  9:28 PM      Result Value Ref Range   C difficile by pcr NEGATIVE  NEGATIVE  GLUCOSE, CAPILLARY     Status: Abnormal   Collection Time    01/29/14 11:55 PM      Result Value Ref Range   Glucose-Capillary 198 (*) 70 - 99 mg/dL   Comment 1 Notify RN     BASIC METABOLIC PANEL     Status: Abnormal   Collection Time    01/30/14  5:20 AM      Result Value Ref Range   Sodium 143  137 - 147 mEq/L   Potassium 3.1 (*) 3.7 - 5.3 mEq/L   Chloride 109  96 - 112 mEq/L   CO2 21  19 - 32 mEq/L   Glucose, Bld 233 (*) 70 - 99 mg/dL   BUN 59 (*) 6 - 23 mg/dL   Creatinine, Ser 1.98 (*) 0.50 - 1.35 mg/dL   Calcium 9.2  8.4 - 10.5 mg/dL   GFR calc non Af Amer 31 (*) >90 mL/min   GFR calc Af Amer 35 (*) >90 mL/min   Comment: (NOTE)     The eGFR has been calculated using the CKD EPI equation.     This calculation has not been validated in all clinical situations.     eGFR's persistently <90 mL/min signify possible Chronic Kidney     Disease.   Anion gap 13  5 - 15  VANCOMYCIN, RANDOM     Status: None   Collection Time    01/30/14  5:20 AM      Result Value Ref Range   Vancomycin Rm 21.8     Comment:            Random Vancomycin therapeutic     range is dependent on dosage and     time of specimen collection.     A peak range is 20.0-40.0 ug/mL     A trough range is 5.0-15.0 ug/mL             GLUCOSE, CAPILLARY     Status: Abnormal   Collection Time    01/30/14  5:39 AM  Result Value Ref Range   Glucose-Capillary 214 (*) 70 - 99 mg/dL   Comment 1 Notify RN      ABGS No results found for this basename: PHART, PCO2, PO2ART, TCO2, HCO3,  in the last 72 hours CULTURES Recent Results (from the past 240 hour(s))  CULTURE, BLOOD (ROUTINE X 2)     Status: None   Collection Time    01/25/14  3:35 PM      Result Value Ref Range Status   Specimen Description BLOOD RIGHT HAND   Final   Special Requests BOTTLES DRAWN AEROBIC AND ANAEROBIC 8CC   Final   Culture  Setup Time     Final   Value: 01/27/2014 00:46     Performed at [facility]   Culture     Final   Value: STAPHYLOCOCCUS SPECIES (COAGULASE NEGATIVE)     Note: THE SIGNIFICANCE OF ISOLATING THIS ORGANISM FROM A SINGLE VENIPUNCTURE CANNOT BE PREDICTED WITHOUT FURTHER CLINICAL  AND CULTURE CORRELATION. SUSCEPTIBILITIES AVAILABLE ONLY ON REQUEST.     Note: Gram Stain Report Called to,Read Back By and Verified With: STONE A. AT 1610 ON 01/26/2014 BY Tyrone Schimke M. Performed at [facility]     Performed at [facility]   Report Status 01/28/2014 FINAL   Final  CULTURE, BLOOD (ROUTINE X 2)     Status: None   Collection Time    01/25/14  3:36 PM      Result Value Ref Range Status   Specimen Description BLOOD LEFT HAND   Final   Special Requests BOTTLES DRAWN AEROBIC ONLY 4CC   Final   Culture NO GROWTH 4 DAYS   Final   Report Status PENDING   Incomplete  URINE CULTURE     Status: None   Collection Time    01/25/14  4:15 PM      Result Value Ref Range Status   Specimen Description URINE, CATHETERIZED   Final   Special Requests NONE   Final   Culture  Setup Time     Final   Value: 01/25/2014 23:29     Performed at [place]     Final   Value: >=100,000 COLONIES/ML     Performed at [facility]   Culture     Final   Value: Multiple bacterial morphotypes present, none predominant. Suggest appropriate recollection if clinically indicated.     Performed at [facility]   Report Status 01/26/2014 FINAL   Final  MRSA PCR SCREENING     Status: None   Collection Time    01/25/14  6:12 PM      Result Value Ref Range Status   MRSA by PCR NEGATIVE  NEGATIVE Final   Comment:            The GeneXpert MRSA Assay (FDA     approved for NASAL specimens     only), is one component of a     comprehensive MRSA colonization     surveillance program. It is not     intended to diagnose MRSA     infection nor to guide or     monitor treatment for     MRSA infections.  CULTURE, EXPECTORATED SPUTUM-ASSESSMENT     Status: None   Collection Time    01/25/14  7:50 PM      Result Value Ref Range Status   Specimen Description SPUTUM   Final   Special Requests Normal  Final   Sputum evaluation     Final   Value: THIS  SPECIMEN IS ACCEPTABLE. RESPIRATORY CULTURE REPORT TO FOLLOW.   Report Status 01/25/2014 FINAL   Final  CULTURE, RESPIRATORY (NON-EXPECTORATED)     Status: None   Collection Time    01/25/14  7:50 PM      Result Value Ref Range Status   Specimen Description TRACHEAL ASPIRATE   Final   Special Requests NONE   Final   Gram Stain     Final   Value: MODERATE WBC PRESENT,BOTH PMN AND MONONUCLEAR     RARE SQUAMOUS EPITHELIAL CELLS PRESENT     ABUNDANT GRAM POSITIVE COCCI     IN PAIRS IN CLUSTERS FEW GRAM NEGATIVE RODS     Performed at [facility]   Culture     Final   Value: NORMAL OROPHARYNGEAL FLORA     Performed at [facility]   Report Status 01/28/2014 FINAL   Final  CLOSTRIDIUM DIFFICILE BY PCR     Status: None   Collection Time    01/29/14  9:28 PM      Result Value Ref Range Status   C difficile by pcr NEGATIVE  NEGATIVE Final   Studies/Results: No results found.  Medications: I have reviewed the patient's current medications.  Assesment:   Active Problems:   HYPERTENSION   Diabetes mellitus, type 2   Acute on chronic renal failure   Dementia with behavioral disturbance   Chronic combined systolic and diastolic congestive heart failure   Respiratory failure with hypoxia   Sepsis   UTI (lower urinary tract infection)   Community acquired pneumonia   Pneumonia Hypokalemai   Plan: Medications reviewed Will continue combination Iv antibiotics  Continue iv fluid according to nephrology recommendation Repeat swallowing study KCl supplement Prognosis: poor.    LOS: 5 days   Marquelle Musgrave 01/30/2014, 11:13 AM

## 2014-01-30 NOTE — Progress Notes (Signed)
ANTIBIOTIC CONSULT NOTE - follow up  Pharmacy Consult for Vancomycin & Zosyn Indication: rule out sepsis  No Known Allergies  Patient Measurements: Height:  (162.6 cm) Weight: 182 lb 5.1 oz (82.7 kg) IBW/kg (Calculated) : 59.2  Vital Signs: Temp: 96.2 F (35.7 C) (09/26 0800) Temp src: Axillary (09/26 0653) BP: 125/44 mmHg (09/26 0800) Pulse Rate: 51 (09/26 0800) Intake/Output from previous day: 09/25 0701 - 09/26 0700 In: 3029 [I.V.:2875; IV Piggyback:154] Out: 1090 [Urine:1090] Intake/Output from this shift: Total I/O In: 50 [IV Piggyback:50] Out: -   Labs:  Recent Labs  01/28/14 0441 01/29/14 0431 01/30/14 0520  CREATININE 2.30* 2.17* 1.98*   Estimated Creatinine Clearance: 29.8 ml/min (by C-G formula based on Cr of 1.98).  Recent Labs  01/29/14 1406 01/30/14 0520  VANCOTROUGH 24.4*  --   VANCORANDOM  --  21.8     Microbiology: Recent Results (from the past 720 hour(s))  CULTURE, BLOOD (ROUTINE X 2)     Status: None   Collection Time    01/25/14  3:35 PM      Result Value Ref Range Status   Specimen Description BLOOD RIGHT HAND   Final   Special Requests BOTTLES DRAWN AEROBIC AND ANAEROBIC 8CC   Final   Culture  Setup Time     Final   Value: 01/27/2014 00:46     Performed at [facility]   Culture     Final   Value: STAPHYLOCOCCUS SPECIES (COAGULASE NEGATIVE)     Note: THE SIGNIFICANCE OF ISOLATING THIS ORGANISM FROM A SINGLE VENIPUNCTURE CANNOT BE PREDICTED WITHOUT FURTHER CLINICAL AND CULTURE CORRELATION. SUSCEPTIBILITIES AVAILABLE ONLY ON REQUEST.     Note: Gram Stain Report Called to,Read Back By and Verified With: STONE A. AT 1854 ON 01/26/2014 BY Luetta Nutting M. Performed at [facility]     Performed at [facility]   Report Status 01/28/2014 FINAL   Final  CULTURE, BLOOD (ROUTINE X 2)     Status: None   Collection Time    01/25/14  3:36 PM      Result Value Ref Range Status   Specimen Description BLOOD LEFT HAND    Final   Special Requests BOTTLES DRAWN AEROBIC ONLY 4CC   Final   Culture NO GROWTH 4 DAYS   Final   Report Status PENDING   Incomplete  URINE CULTURE     Status: None   Collection Time    01/25/14  4:15 PM      Result Value Ref Range Status   Specimen Description URINE, CATHETERIZED   Final   Special Requests NONE   Final   Culture  Setup Time     Final   Value: 01/25/2014 23:29     Performed at [facility] Count     Final   Value: >=100,000 COLONIES/ML     Performed at [facility]   Culture     Final   Value: Multiple bacterial morphotypes present, none predominant. Suggest appropriate recollection if clinically indicated.     Performed at [facility]   Report Status 01/26/2014 FINAL   Final  MRSA PCR SCREENING     Status: None   Collection Time    01/25/14  6:12 PM      Result Value Ref Range Status   MRSA by PCR NEGATIVE  NEGATIVE Final   Comment:            The GeneXpert MRSA Assay (FDA  approved for NASAL specimens     only), is one component of a     comprehensive MRSA colonization     surveillance program. It is not     intended to diagnose MRSA     infection nor to guide or     monitor treatment for     MRSA infections.  CULTURE, EXPECTORATED SPUTUM-ASSESSMENT     Status: None   Collection Time    01/25/14  7:50 PM      Result Value Ref Range Status   Specimen Description SPUTUM   Final   Special Requests Normal   Final   Sputum evaluation     Final   Value: THIS SPECIMEN IS ACCEPTABLE. RESPIRATORY CULTURE REPORT TO FOLLOW.   Report Status 01/25/2014 FINAL   Final  CULTURE, RESPIRATORY (NON-EXPECTORATED)     Status: None   Collection Time    01/25/14  7:50 PM      Result Value Ref Range Status   Specimen Description TRACHEAL ASPIRATE   Final   Special Requests NONE   Final   Gram Stain     Final   Value: MODERATE WBC PRESENT,BOTH PMN AND MONONUCLEAR     RARE SQUAMOUS EPITHELIAL CELLS PRESENT     ABUNDANT GRAM  POSITIVE COCCI     IN PAIRS IN CLUSTERS FEW GRAM NEGATIVE RODS     Performed at [facility]   Culture     Final   Value: NORMAL OROPHARYNGEAL FLORA     Performed at [facility]   Report Status 01/28/2014 FINAL   Final  CLOSTRIDIUM DIFFICILE BY PCR     Status: None   Collection Time    01/29/14  9:28 PM      Result Value Ref Range Status   C difficile by pcr NEGATIVE  NEGATIVE Final   Medical History: Past Medical History  Diagnosis Date  . Coronary atherosclerosis of native coronary artery 2000    CABG 2000 following acute MI - refused followup cardiac catheterization 2012  . Gout   . Hypothyroidism   . Seizures   . Ventricular tachycardia     On amiodarone  . Diabetes mellitus, type 2   . Cerebrovascular disease 2004    Left cerebral CVA followed by carotid endarterectomy in [place]  . Essential hypertension, benign   . Gastritis 2004    Gastric erosions  . Hyperlipidemia   . Ischemic cardiomyopathy     LVEF 45% 2013   Medications:  Scheduled:  . antiseptic oral rinse  7 mL Mouth Rinse BID  . furosemide  60 mg Intravenous BID  . heparin  5,000 Units Subcutaneous 3 times per day  . Influenza vac split quadrivalent PF  0.5 mL Intramuscular Tomorrow-1000  . insulin aspart  0-9 Units Subcutaneous Q6H  . phenytoin (DILANTIN) IV  100 mg Intravenous 4 times per day  . piperacillin-tazobactam (ZOSYN)  IV  3.375 g Intravenous Q8H  . pneumococcal 23 valent vaccine  0.5 mL Intramuscular Tomorrow-1000   Assessment: Vancomycin & Zosyn pharmacy protocol for sepsis. Culture results as above.  Tracheal aspirate with GPC. Vancomycin trough still above goal after holding Vancomycin yesterday  Goal of Therapy:  Vancomycin trough level 15-20 mcg/ml  Plan:  Hold Vancomycin dosing Vancomycin random level in AM Pharmacy to redose Vancomycin when Vancomycin level in acceptable range Continue Zosyn 3.375 GM IV every 8 hours, each dose over 4 hours Monitor renal  function Labs per protocol  [name], Ascher Schroepfer Bennett 01/30/2014,8:37  AM     

## 2014-01-31 ENCOUNTER — Inpatient Hospital Stay (HOSPITAL_COMMUNITY): Payer: Medicare Other

## 2014-01-31 ENCOUNTER — Encounter (HOSPITAL_COMMUNITY): Payer: Medicare Other

## 2014-01-31 LAB — BASIC METABOLIC PANEL
Anion gap: 10 (ref 5–15)
BUN: 52 mg/dL — ABNORMAL HIGH (ref 6–23)
CALCIUM: 8.9 mg/dL (ref 8.4–10.5)
CO2: 22 meq/L (ref 19–32)
Chloride: 105 mEq/L (ref 96–112)
Creatinine, Ser: 1.82 mg/dL — ABNORMAL HIGH (ref 0.50–1.35)
GFR calc Af Amer: 39 mL/min — ABNORMAL LOW (ref >90)
GFR calc non Af Amer: 34 mL/min — ABNORMAL LOW (ref >90)
GLUCOSE: 203 mg/dL — AB (ref 70–99)
Potassium: 3.4 mEq/L — ABNORMAL LOW (ref 3.7–5.3)
Sodium: 137 mEq/L (ref 137–147)

## 2014-01-31 LAB — GLUCOSE, CAPILLARY
GLUCOSE-CAPILLARY: 194 mg/dL — AB (ref 70–99)
GLUCOSE-CAPILLARY: 211 mg/dL — AB (ref 70–99)
Glucose-Capillary: 166 mg/dL — ABNORMAL HIGH (ref 70–99)
Glucose-Capillary: 177 mg/dL — ABNORMAL HIGH (ref 70–99)

## 2014-01-31 LAB — VANCOMYCIN, RANDOM: VANCOMYCIN RM: 21.3 ug/mL

## 2014-01-31 MED ORDER — SODIUM CHLORIDE 0.9 % IJ SOLN
10.0000 mL | Freq: Two times a day (BID) | INTRAMUSCULAR | Status: DC
  Administered 2014-01-31: 20 mL
  Administered 2014-01-31 – 2014-02-02 (×3): 10 mL

## 2014-01-31 MED ORDER — SODIUM CHLORIDE 0.9 % IJ SOLN
10.0000 mL | INTRAMUSCULAR | Status: DC | PRN
  Administered 2014-01-31: 10 mL
  Administered 2014-01-31: 30 mL

## 2014-01-31 NOTE — Progress Notes (Signed)
Subjective: Interval History: patient still does not communicate  Objective: Vital signs in last 24 hours: Temp:  [96.3 F (35.7 C)-97.9 F (36.6 C)] 97.9 F (36.6 C) (09/27 0900) Pulse Rate:  [34-75] 56 (09/27 0900) Resp:  [10-19] 11 (09/27 0900) BP: (92-147)/(32-63) 94/41 mmHg (09/27 0900) SpO2:  [93 %-100 %] 94 % (09/27 0900) FiO2 (%):  [8 %-32 %] 32 % (09/27 0800) Weight change:   Intake/Output from previous day: 09/26 0701 - 09/27 0700 In: 1880.3 [I.V.:1728.3; IV Piggyback:152] Out: 1625 [Urine:1625] Intake/Output this shift: Total I/O In: 50 [IV Piggyback:50] Out: -   Generally: Patient is more alert today but mumblling  And does not communicate very well seems his base line  Chest decrease breath sound   but no wheezing,rales or rhonchi Heart RRR no murmur Extremities: trace edema on the right and none ton the left   Lab Results: No results found for this basename: WBC, HGB, HCT, PLT,  in the last 72 hours BMET:   Recent Labs  01/30/14 0520 01/31/14 0515  NA 143 137  K 3.1* 3.4*  CL 109 105  CO2 21 22  GLUCOSE 233* 203*  BUN 59* 52*  CREATININE 1.98* 1.82*  CALCIUM 9.2 8.9   No results found for this basename: PTH,  in the last 72 hours Iron Studies:   Recent Labs  01/29/14 0431  IRON 55  TIBC 102*  FERRITIN 785*    Studies/Results: No results found.  I have reviewed the patient's current medications.  Assessment/Plan: Problem #1 acute kidney injury: Possibly combination of ATN from sepsis/hypotension, renal and ARBS. None oliguric and his renal function improving progressively Problem #2 chronic renal failure: Possibly stage III. Etiology is multifactorial including diabetes/hypertension/ischemic. Problem #3 diabetes Problem #4 respiratory failure/hypoxemia. Problem #5 altered mental status. Patient is more alert today Problem #6 anemia: His hemoglobin and hematocrit is low. Etiology seems to be secondary to chronic diseases including  renal failure Problem #7 Hypokalemia: His potassium is low but improving. Presently on potassium given with ivf Problem #8 hypothyroidism Problem#9 Metabolic bone disease his calcium and phosphorus is in range Plan: We'll continue with present treatment We'll check his basic metabolic panel in the morning.   LOS: 6 days   Keily Lepp S 01/31/2014,9:35 AM

## 2014-01-31 NOTE — Progress Notes (Signed)
Subjective: Patient is more awake but still weak and fragile. He is not able to take po feeding. We will be evaluated for swallowing tomorrow. Objective: Vital signs in last 24 hours: Temp:  [96.4 F (35.8 C)-97.9 F (36.6 C)] 97.9 F (36.6 C) (09/27 0900) Pulse Rate:  [34-75] 56 (09/27 0900) Resp:  [10-19] 11 (09/27 0900) BP: (92-147)/(32-63) 94/41 mmHg (09/27 0900) SpO2:  [93 %-100 %] 94 % (09/27 0900) FiO2 (%):  [8 %-32 %] 32 % (09/27 0800) Weight change:  Last BM Date: 01/30/14  Intake/Output from previous day: 09/26 0701 - 09/27 0700 In: 1880.3 [I.V.:1728.3; IV Piggyback:152] Out: 1625 [Urine:1625]  PHYSICAL EXAM General appearance: delirious and mild distress Resp: diminished breath sounds bilaterally and rhonchi bilaterally Cardio: S1, S2 normal GI: soft, non-tender; bowel sounds normal; no masses,  no organomegaly Extremities: extremities normal, atraumatic, no cyanosis or edema  Lab Results:  Results for orders placed during the hospital encounter of 01/25/14 (from the past 48 hour(s))  GLUCOSE, CAPILLARY     Status: Abnormal   Collection Time    01/29/14 11:20 AM      Result Value Ref Range   Glucose-Capillary 223 (*) 70 - 99 mg/dL   Comment 1 Notify RN    VANCOMYCIN, [place]     Status: Abnormal   Collection Time    01/29/14  2:06 PM      Result Value Ref Range   Vancomycin Tr 24.4 (*) 10.0 - 20.0 ug/mL  GLUCOSE, CAPILLARY     Status: Abnormal   Collection Time    01/29/14  5:23 PM      Result Value Ref Range   Glucose-Capillary 218 (*) 70 - 99 mg/dL   Comment 1 Notify RN    CLOSTRIDIUM DIFFICILE BY PCR     Status: None   Collection Time    01/29/14  9:28 PM      Result Value Ref Range   C difficile by pcr NEGATIVE  NEGATIVE  GLUCOSE, CAPILLARY     Status: Abnormal   Collection Time    01/29/14 11:55 PM      Result Value Ref Range   Glucose-Capillary 198 (*) 70 - 99 mg/dL   Comment 1 Notify RN    BASIC METABOLIC PANEL     Status: Abnormal   Collection Time    01/30/14  5:20 AM      Result Value Ref Range   Sodium 143  137 - 147 mEq/L   Potassium 3.1 (*) 3.7 - 5.3 mEq/L   Chloride 109  96 - 112 mEq/L   CO2 21  19 - 32 mEq/L   Glucose, Bld 233 (*) 70 - 99 mg/dL   BUN 59 (*) 6 - 23 mg/dL   Creatinine, Ser 1.98 (*) 0.50 - 1.35 mg/dL   Calcium 9.2  8.4 - 10.5 mg/dL   GFR calc non Af Amer 31 (*) >90 mL/min   GFR calc Af Amer 35 (*) >90 mL/min   Comment: (NOTE)     The eGFR has been calculated using the CKD EPI equation.     This calculation has not been validated in all clinical situations.     eGFR's persistently <90 mL/min signify possible Chronic Kidney     Disease.   Anion gap 13  5 - 15  VANCOMYCIN, RANDOM     Status: None   Collection Time    01/30/14  5:20 AM      Result Value Ref Range   Vancomycin Rm  21.8     Comment:            Random Vancomycin therapeutic     range is dependent on dosage and     time of specimen collection.     A peak range is 20.0-40.0 ug/mL     A trough range is 5.0-15.0 ug/mL             GLUCOSE, CAPILLARY     Status: Abnormal   Collection Time    01/30/14  5:39 AM      Result Value Ref Range   Glucose-Capillary 214 (*) 70 - 99 mg/dL   Comment 1 Notify RN    GLUCOSE, CAPILLARY     Status: Abnormal   Collection Time    01/30/14 11:40 AM      Result Value Ref Range   Glucose-Capillary 207 (*) 70 - 99 mg/dL   Comment 1 Documented in Chart     Comment 2 Notify RN    GLUCOSE, CAPILLARY     Status: Abnormal   Collection Time    01/30/14  5:52 PM      Result Value Ref Range   Glucose-Capillary 174 (*) 70 - 99 mg/dL   Comment 1 Notify RN     Comment 2 Documented in Chart    GLUCOSE, CAPILLARY     Status: Abnormal   Collection Time    01/30/14 11:40 PM      Result Value Ref Range   Glucose-Capillary 179 (*) 70 - 99 mg/dL  VANCOMYCIN, RANDOM     Status: None   Collection Time    01/31/14  5:15 AM      Result Value Ref Range   Vancomycin Rm 21.3     Comment:            Random  Vancomycin therapeutic     range is dependent on dosage and     time of specimen collection.     A peak range is 20.0-40.0 ug/mL     A trough range is 5.0-15.0 ug/mL             BASIC METABOLIC PANEL     Status: Abnormal   Collection Time    01/31/14  5:15 AM      Result Value Ref Range   Sodium 137  137 - 147 mEq/L   Potassium 3.4 (*) 3.7 - 5.3 mEq/L   Chloride 105  96 - 112 mEq/L   CO2 22  19 - 32 mEq/L   Glucose, Bld 203 (*) 70 - 99 mg/dL   BUN 52 (*) 6 - 23 mg/dL   Creatinine, Ser 1.82 (*) 0.50 - 1.35 mg/dL   Calcium 8.9  8.4 - 10.5 mg/dL   GFR calc non Af Amer 34 (*) >90 mL/min   GFR calc Af Amer 39 (*) >90 mL/min   Comment: (NOTE)     The eGFR has been calculated using the CKD EPI equation.     This calculation has not been validated in all clinical situations.     eGFR's persistently <90 mL/min signify possible Chronic Kidney     Disease.   Anion gap 10  5 - 15  GLUCOSE, CAPILLARY     Status: Abnormal   Collection Time    01/31/14  6:10 AM      Result Value Ref Range   Glucose-Capillary 211 (*) 70 - 99 mg/dL    ABGS No results found for this basename: PHART, PCO2, PO2ART, TCO2, HCO3,  in the last 72 hours CULTURES Recent Results (from the past 240 hour(s))  CULTURE, BLOOD (ROUTINE X 2)     Status: None   Collection Time    01/25/14  3:35 PM      Result Value Ref Range Status   Specimen Description BLOOD RIGHT HAND   Final   Special Requests BOTTLES DRAWN AEROBIC AND ANAEROBIC 8CC   Final   Culture  Setup Time     Final   Value: 01/27/2014 00:46     Performed at [facility]   Culture     Final   Value: STAPHYLOCOCCUS SPECIES (COAGULASE NEGATIVE)     Note: THE SIGNIFICANCE OF ISOLATING THIS ORGANISM FROM A SINGLE VENIPUNCTURE CANNOT BE PREDICTED WITHOUT FURTHER CLINICAL AND CULTURE CORRELATION. SUSCEPTIBILITIES AVAILABLE ONLY ON REQUEST.     Note: Gram Stain Report Called to,Read Back By and Verified With: STONE A. AT [id number] ON 01/26/2014 BY Tyrone Schimke M.  Performed at [facility]     Performed at [facility]   Report Status 01/28/2014 FINAL   Final  CULTURE, BLOOD (ROUTINE X 2)     Status: None   Collection Time    01/25/14  3:36 PM      Result Value Ref Range Status   Specimen Description BLOOD LEFT HAND   Final   Special Requests BOTTLES DRAWN AEROBIC ONLY 4CC   Final   Culture NO GROWTH 5 DAYS   Final   Report Status 01/30/2014 FINAL   Final  URINE CULTURE     Status: None   Collection Time    01/25/14  4:15 PM      Result Value Ref Range Status   Specimen Description URINE, CATHETERIZED   Final   Special Requests NONE   Final   Culture  Setup Time     Final   Value: 01/25/2014 23:29     Performed at [facility]     Final   Value: >=100,000 COLONIES/ML     Performed at [facility]   Culture     Final   Value: Multiple bacterial morphotypes present, none predominant. Suggest appropriate recollection if clinically indicated.     Performed at [facility]   Report Status 01/26/2014 FINAL   Final  MRSA PCR SCREENING     Status: None   Collection Time    01/25/14  6:12 PM      Result Value Ref Range Status   MRSA by PCR NEGATIVE  NEGATIVE Final   Comment:            The GeneXpert MRSA Assay (FDA     approved for NASAL specimens     only), is one component of a     comprehensive MRSA colonization     surveillance program. It is not     intended to diagnose MRSA     infection nor to guide or     monitor treatment for     MRSA infections.  CULTURE, EXPECTORATED SPUTUM-ASSESSMENT     Status: None   Collection Time    01/25/14  7:50 PM      Result Value Ref Range Status   Specimen Description SPUTUM   Final   Special Requests Normal   Final   Sputum evaluation     Final   Value: THIS SPECIMEN IS ACCEPTABLE. RESPIRATORY CULTURE REPORT TO FOLLOW.   Report Status 01/25/2014 FINAL   Final  CULTURE, RESPIRATORY (NON-EXPECTORATED)  Status: None   Collection Time     01/25/14  7:50 PM      Result Value Ref Range Status   Specimen Description TRACHEAL ASPIRATE   Final   Special Requests NONE   Final   Gram Stain     Final   Value: MODERATE WBC PRESENT,BOTH PMN AND MONONUCLEAR     RARE SQUAMOUS EPITHELIAL CELLS PRESENT     ABUNDANT GRAM POSITIVE COCCI     IN PAIRS IN CLUSTERS FEW GRAM NEGATIVE RODS     Performed at [facility]   Culture     Final   Value: NORMAL OROPHARYNGEAL FLORA     Performed at [facility]   Report Status 01/28/2014 FINAL   Final  CLOSTRIDIUM DIFFICILE BY PCR     Status: None   Collection Time    01/29/14  9:28 PM      Result Value Ref Range Status   C difficile by pcr NEGATIVE  NEGATIVE Final   Studies/Results: No results found.  Medications: I have reviewed the patient's current medications.  Assesment:   Active Problems:   HYPERTENSION   Diabetes mellitus, type 2   Acute on chronic renal failure   Dementia with behavioral disturbance   Chronic combined systolic and diastolic congestive heart failure   Respiratory failure with hypoxia   Sepsis   UTI (lower urinary tract infection)   Community acquired pneumonia   Pneumonia Hypokalemai   Plan: Medications reviewed Will continue combination Iv antibiotics  Continue iv fluid according to nephrology recommendation Repeat swallowing study KCl supplement Dilantin level Prognosis: poor.    LOS: 6 days   Agustina Witzke 01/31/2014, 10:29 AM

## 2014-01-31 NOTE — Progress Notes (Signed)
ANTIBIOTIC CONSULT NOTE - follow up  Pharmacy Consult for Vancomycin & Zosyn Indication: rule out sepsis  No Known Allergies  Patient Measurements: Height:  (162.6 cm) Weight: 182 lb 5.1 oz (82.7 kg) IBW/kg (Calculated) : 59.2  Vital Signs: Temp: 97.7 F (36.5 C) (09/27 0800) Temp src: Oral (09/27 0000) BP: 109/45 mmHg (09/27 0800) Pulse Rate: 66 (09/27 0800) Intake/Output from previous day: 09/26 0701 - 09/27 0700 In: 1880.3 [I.V.:1728.3; IV Piggyback:152] Out: 1625 [Urine:1625] Intake/Output from this shift: Total I/O In: 50 [IV Piggyback:50] Out: -   Labs:  Recent Labs  01/29/14 0431 01/30/14 0520 01/31/14 0515  CREATININE 2.17* 1.98* 1.82*   Estimated Creatinine Clearance: 32.5 ml/min (by C-G formula based on Cr of 1.82).  Recent Labs  01/29/14 1406 01/30/14 0520 01/31/14 0515  VANCOTROUGH 24.4*  --   --   VANCORANDOM  --  21.8 21.3     Microbiology: Recent Results (from the past 720 hour(s))  CULTURE, BLOOD (ROUTINE X 2)     Status: None   Collection Time    01/25/14  3:35 PM      Result Value Ref Range Status   Specimen Description BLOOD RIGHT HAND   Final   Special Requests BOTTLES DRAWN AEROBIC AND ANAEROBIC 8CC   Final   Culture  Setup Time     Final   Value: 01/27/2014 00:46     Performed at [facility]   Culture     Final   Value: STAPHYLOCOCCUS SPECIES (COAGULASE NEGATIVE)     Note: THE SIGNIFICANCE OF ISOLATING THIS ORGANISM FROM A SINGLE VENIPUNCTURE CANNOT BE PREDICTED WITHOUT FURTHER CLINICAL AND CULTURE CORRELATION. SUSCEPTIBILITIES AVAILABLE ONLY ON REQUEST.     Note: Gram Stain Report Called to,Read Back By and Verified With: STONE A. AT 1854 ON 01/26/2014 BY Luetta Nutting M. Performed at [facility]     Performed at [facility]   Report Status 01/28/2014 FINAL   Final  CULTURE, BLOOD (ROUTINE X 2)     Status: None   Collection Time    01/25/14  3:36 PM      Result Value Ref Range Status   Specimen  Description BLOOD LEFT HAND   Final   Special Requests BOTTLES DRAWN AEROBIC ONLY 4CC   Final   Culture NO GROWTH 5 DAYS   Final   Report Status 01/30/2014 FINAL   Final  URINE CULTURE     Status: None   Collection Time    01/25/14  4:15 PM      Result Value Ref Range Status   Specimen Description URINE, CATHETERIZED   Final   Special Requests NONE   Final   Culture  Setup Time     Final   Value: 01/25/2014 23:29     Performed at [facility] Count     Final   Value: >=100,000 COLONIES/ML     Performed at [facility]   Culture     Final   Value: Multiple bacterial morphotypes present, none predominant. Suggest appropriate recollection if clinically indicated.     Performed at [facility]   Report Status 01/26/2014 FINAL   Final  MRSA PCR SCREENING     Status: None   Collection Time    01/25/14  6:12 PM      Result Value Ref Range Status   MRSA by PCR NEGATIVE  NEGATIVE Final   Comment:  The GeneXpert MRSA Assay (FDA     approved for NASAL specimens     only), is one component of a     comprehensive MRSA colonization     surveillance program. It is not     intended to diagnose MRSA     infection nor to guide or     monitor treatment for     MRSA infections.  CULTURE, EXPECTORATED SPUTUM-ASSESSMENT     Status: None   Collection Time    01/25/14  7:50 PM      Result Value Ref Range Status   Specimen Description SPUTUM   Final   Special Requests Normal   Final   Sputum evaluation     Final   Value: THIS SPECIMEN IS ACCEPTABLE. RESPIRATORY CULTURE REPORT TO FOLLOW.   Report Status 01/25/2014 FINAL   Final  CULTURE, RESPIRATORY (NON-EXPECTORATED)     Status: None   Collection Time    01/25/14  7:50 PM      Result Value Ref Range Status   Specimen Description TRACHEAL ASPIRATE   Final   Special Requests NONE   Final   Gram Stain     Final   Value: MODERATE WBC PRESENT,BOTH PMN AND MONONUCLEAR     RARE SQUAMOUS EPITHELIAL CELLS  PRESENT     ABUNDANT GRAM POSITIVE COCCI     IN PAIRS IN CLUSTERS FEW GRAM NEGATIVE RODS     Performed at [facility]   Culture     Final   Value: NORMAL OROPHARYNGEAL FLORA     Performed at [facility]   Report Status 01/28/2014 FINAL   Final  CLOSTRIDIUM DIFFICILE BY PCR     Status: None   Collection Time    01/29/14  9:28 PM      Result Value Ref Range Status   C difficile by pcr NEGATIVE  NEGATIVE Final   Medical History: Past Medical History  Diagnosis Date  . Coronary atherosclerosis of native coronary artery 2000    CABG 2000 following acute MI - refused followup cardiac catheterization 2012  . Gout   . Hypothyroidism   . Seizures   . Ventricular tachycardia     On amiodarone  . Diabetes mellitus, type 2   . Cerebrovascular disease 2004    Left cerebral CVA followed by carotid endarterectomy in   . Essential hypertension, benign   . Gastritis 2004    Gastric erosions  . Hyperlipidemia   . Ischemic cardiomyopathy     LVEF 45% 2013   Medications:  Scheduled:  . antiseptic oral rinse  7 mL Mouth Rinse BID  . furosemide  60 mg Intravenous BID  . heparin  5,000 Units Subcutaneous 3 times per day  . Influenza vac split quadrivalent PF  0.5 mL Intramuscular Tomorrow-1000  . insulin aspart  0-9 Units Subcutaneous Q6H  . phenytoin (DILANTIN) IV  100 mg Intravenous 4 times per day  . piperacillin-tazobactam (ZOSYN)  IV  3.375 g Intravenous Q8H  . pneumococcal 23 valent vaccine  0.5 mL Intramuscular Tomorrow-1000   Assessment: Vancomycin & Zosyn pharmacy protocol for sepsis. Culture results as above.  Tracheal aspirate with GPC. Vancomycin trough still above goal after holding Vancomycin   Goal of Therapy:  Vancomycin trough level 15-20 mcg/ml  Plan:  Hold Vancomycin dosing Vancomycin random level in AM Pharmacy to redose Vancomycin when Vancomycin level in acceptable range Continue Zosyn 3.375 GM IV every 8 hours, each dose over 4  hours Monitor renal  function Labs per protocol  Donald Owen, Donald Owen 01/31/2014,8:38 AM

## 2014-01-31 NOTE — Progress Notes (Signed)
Peripherally Inserted Central Catheter/Midline Placement  The IV Nurse has discussed with the patient and/or persons authorized to consent for the patient, the purpose of this procedure and the potential benefits and risks involved with this procedure.  The benefits include less needle sticks, lab draws from the catheter and patient may be discharged home with the catheter.  Risks include, but not limited to, infection, bleeding, blood clot (thrombus formation), and puncture of an artery; nerve damage and irregular heat beat.  Alternatives to this procedure were also discussed.  PICC/Midline Placement Documentation  PICC / Midline Double Lumen 01/31/14 PICC Right Basilic 39 cm 0 cm (Active)  Indication for Insertion or Continuance of Line Prolonged intravenous therapies 01/31/2014  1:21 PM  Exposed Catheter (cm) 0 cm 01/31/2014  1:21 PM  Site Assessment Clean;Dry;Intact 01/31/2014  1:21 PM  Lumen #1 Status Flushed;Capped (Central line);Blood return noted 01/31/2014  1:21 PM  Lumen #2 Status Flushed;Capped (Central line);Blood return noted 01/31/2014  1:21 PM  Dressing Type Transparent;Securing device 01/31/2014  1:21 PM  Dressing Status Dry;Clean;Antimicrobial disc in place;Intact 01/31/2014  1:21 PM  Line Care Connections checked and tightened 01/31/2014  1:21 PM  Line Adjustment (NICU/IV Team Only) No 01/31/2014  1:21 PM  Dressing Intervention New dressing 01/31/2014  1:21 PM  Dressing Change Due 02/07/14 01/31/2014  1:21 PM       Shreena Baines, Meredith Mody 01/31/2014, 1:46 PM PICC Line Insertion Procedure Note  Procedure: Insertion of #5FR PICC  Indications:  Long Term IV therapy  Procedure Details  Informed consent was obtained for the procedure, including sedation.  Risks of lung perforation, hemorrhage, and adverse drug reaction were discussed.   Maximum sterile technique was used including antiseptics, cap, gloves, gown, hand hygiene, mask and sheet.  #5r PICC inserted to the R Basilic vein  per hospital protocol.   Blood return:  yes  Findings: Catheter inserted to 39 cm, with 0 cm. Exposed.   There were no changes to vital signs. Catheter was flushed with 30 cc NS. Patient did tolerate procedure well.  Recommendations: CXR ordered to verify placement. PICC Brochure given to patient with teaching instruction.

## 2014-01-31 NOTE — Progress Notes (Signed)
Patient with 11 beat run of PVC's at 1733, notified MD on call of frequent multifocal PVC's.  Patient has remained off BP and heart rate medication due to hypotension and bradyarrythmia. Pressure remains soft at 103/46.  HR in the 60's. Cardiology consult placed per MD order.

## 2014-01-31 NOTE — Progress Notes (Signed)
Two attempts for additional IV access, without success, With the combination of medications at this time 2 sites mandatory.  Only 1/2 dose of zosyn given.

## 2014-02-01 ENCOUNTER — Encounter (HOSPITAL_COMMUNITY): Payer: Self-pay | Admitting: Cardiology

## 2014-02-01 DIAGNOSIS — I2589 Other forms of chronic ischemic heart disease: Secondary | ICD-10-CM

## 2014-02-01 DIAGNOSIS — I2510 Atherosclerotic heart disease of native coronary artery without angina pectoris: Secondary | ICD-10-CM

## 2014-02-01 DIAGNOSIS — I509 Heart failure, unspecified: Secondary | ICD-10-CM

## 2014-02-01 DIAGNOSIS — I493 Ventricular premature depolarization: Secondary | ICD-10-CM

## 2014-02-01 DIAGNOSIS — N179 Acute kidney failure, unspecified: Secondary | ICD-10-CM

## 2014-02-01 DIAGNOSIS — I255 Ischemic cardiomyopathy: Secondary | ICD-10-CM

## 2014-02-01 DIAGNOSIS — N189 Chronic kidney disease, unspecified: Secondary | ICD-10-CM

## 2014-02-01 DIAGNOSIS — I7090 Unspecified atherosclerosis: Secondary | ICD-10-CM

## 2014-02-01 DIAGNOSIS — J690 Pneumonitis due to inhalation of food and vomit: Secondary | ICD-10-CM

## 2014-02-01 DIAGNOSIS — I5043 Acute on chronic combined systolic (congestive) and diastolic (congestive) heart failure: Secondary | ICD-10-CM

## 2014-02-01 LAB — CBC
HCT: 31.3 % — ABNORMAL LOW (ref 39.0–52.0)
HEMOGLOBIN: 10.4 g/dL — AB (ref 13.0–17.0)
MCH: 30.8 pg (ref 26.0–34.0)
MCHC: 33.2 g/dL (ref 30.0–36.0)
MCV: 92.6 fL (ref 78.0–100.0)
Platelets: 254 10*3/uL (ref 150–400)
RBC: 3.38 MIL/uL — ABNORMAL LOW (ref 4.22–5.81)
RDW: 14.9 % (ref 11.5–15.5)
WBC: 6.4 10*3/uL (ref 4.0–10.5)

## 2014-02-01 LAB — VANCOMYCIN, RANDOM: Vancomycin Rm: 15.9 ug/mL

## 2014-02-01 LAB — BASIC METABOLIC PANEL
Anion gap: 11 (ref 5–15)
BUN: 44 mg/dL — AB (ref 6–23)
CHLORIDE: 105 meq/L (ref 96–112)
CO2: 22 meq/L (ref 19–32)
Calcium: 8.9 mg/dL (ref 8.4–10.5)
Creatinine, Ser: 1.52 mg/dL — ABNORMAL HIGH (ref 0.50–1.35)
GFR calc Af Amer: 49 mL/min — ABNORMAL LOW (ref >90)
GFR calc non Af Amer: 42 mL/min — ABNORMAL LOW (ref >90)
Glucose, Bld: 187 mg/dL — ABNORMAL HIGH (ref 70–99)
POTASSIUM: 3.9 meq/L (ref 3.7–5.3)
Sodium: 138 mEq/L (ref 137–147)

## 2014-02-01 LAB — GLUCOSE, CAPILLARY
GLUCOSE-CAPILLARY: 154 mg/dL — AB (ref 70–99)
Glucose-Capillary: 169 mg/dL — ABNORMAL HIGH (ref 70–99)
Glucose-Capillary: 167 mg/dL — ABNORMAL HIGH (ref 70–99)

## 2014-02-01 LAB — PHENYTOIN LEVEL, TOTAL: Phenytoin Lvl: 9.3 ug/mL — ABNORMAL LOW (ref 10.0–20.0)

## 2014-02-01 MED ORDER — LORAZEPAM 2 MG/ML IJ SOLN
0.5000 mg | INTRAMUSCULAR | Status: DC | PRN
  Administered 2014-02-02 (×2): 0.5 mg via INTRAVENOUS
  Filled 2014-02-01 (×2): qty 1

## 2014-02-01 NOTE — Consult Note (Signed)
Primary cardiologist: Dr. Lewayne Bunting Consulting cardiologist: Dr. Jonelle Sidle  Clinical Summary Donald Owen is a medically complex 80 y.o.male admitted to the hospital with generalized decline including reduced level of consciousness on baseline dementia, decreased by mouth intake, fevers, and respiratory distress, with diagnosis of probable aspiration pneumonia. He is currently not on any of his regular cardiac medications since he is n.p.o., followup swallowing evaluation pending.   Patient is followed by Dr. Ladona Ridgel as noted above with known history of ischemic heart disease, cardiomyopathy, and VT. He has declined invasive workup over the last few years, and has been managed conservatively.  Patient reported to have PVCs on monitor, I reviewed available tracings and note some probable slow atrial fibrillation with bundle-branch block, also occasional PVCs. No VT.  Echocardiogram from April of this year showed LVEF 45-50% with inferior and inferolateral wall motion maladies, restrictive diastolic filling pattern, mild mitral regurgitation, moderately sclerotic aortic valve, the AST 51 mm mercury to   No Known Allergies  Medications Scheduled Medications: . antiseptic oral rinse  7 mL Mouth Rinse BID  . heparin  5,000 Units Subcutaneous 3 times per day  . Influenza vac split quadrivalent PF  0.5 mL Intramuscular Tomorrow-1000  . insulin aspart  0-9 Units Subcutaneous Q6H  . phenytoin (DILANTIN) IV  100 mg Intravenous 4 times per day  . piperacillin-tazobactam (ZOSYN)  IV  3.375 g Intravenous Q8H  . pneumococcal 23 valent vaccine  0.5 mL Intramuscular Tomorrow-1000  . sodium chloride  10-40 mL Intracatheter Q12H    Infusions: . dextrose 5 % with kcl 50 mL/hr at 02/01/14 0754    PRN Medications: morphine injection, sodium chloride   Past Medical History  Diagnosis Date  . Coronary atherosclerosis of native coronary artery 2000    CABG 2000 following acute MI -  refused followup cardiac catheterization 2012  . Gout   . Hypothyroidism   . Seizures   . Ventricular tachycardia     On amiodarone  . Diabetes mellitus, type 2   . Cerebrovascular disease 2004    Left cerebral CVA followed by carotid endarterectomy in [place]  . Essential hypertension, benign   . Gastritis 2004    Gastric erosions  . Hyperlipidemia   . Ischemic cardiomyopathy     LVEF 45% 2013    Past Surgical History  Procedure Laterality Date  . Coronary artery bypass graft  04/1999    Dr. Barry Dienes - LIMA to LAD, SVG to D1, SVG to OM1, SVG to PDA, SVG to PLA  . Carotid endarterectomy    . Cholecystectomy      Family History  Problem Relation Age of Onset  . Heart disease Father     Social History Donald Owen reports that he quit smoking about 14 years ago. His smoking use included Cigarettes. He has a 60 pack-year smoking history. He has never used smokeless tobacco. Donald Owen reports that he does not drink alcohol.  Review of Systems Limited. Patient provides few answers to questions.  Physical Examination Blood pressure 128/40, pulse 76, temperature 97.6 F (36.4 C), temperature source Oral, resp. rate 12, height  (1.626 m), weight 182 lb 5.1 oz (82.7 kg), SpO2 98.00%.  Intake/Output Summary (Last 24 hours) at 02/01/14 0844 Last data filed at 02/01/14 0600  Gross per 24 hour  Intake   2400 ml  Output   2425 ml  Net    -25 ml    Telemetry: Possible sinus rhythm with significantly prolonged PR  interval, PACs, versus ectopic atrial rhythm.  Chronically ill-appearing male, mildly short of breath with intermittent cough. HEENT: Conjunctiva and lids normal, oropharynx clear. Neck: Supple, elevated JVP, no carotid bruits, no thyromegaly. Lungs: Scattered coarse rhonchi with end expiratory wheeze, mildly labored breathing at rest. Cardiac: Regular with ectopy, no S3, 2/6 systolic murmur, no pericardial rub. Abdomen: Soft, nontender, protuberant, bowel sounds  present, no guarding or rebound. Extremities: Chronic appearing edema, distal pulses 1-2+. Skin: Warm and dry. Musculoskeletal: No kyphosis. Neuropsychiatric: Alert and oriented x2, limited answers to questions.   Lab Results  Basic Metabolic Panel:  Recent Labs Lab 01/28/14 0441 01/29/14 0431 01/30/14 0520 01/31/14 0515 02/01/14 0459  NA 145 144 143 137 138  K 4.6 3.7 3.1* 3.4* 3.9  CL 112 111 109 105 105  CO2 GLUCOSE 124* 251* 233* 203* 187*  BUN 67* 66* 59* 52* 44*  CREATININE 2.30* 2.17* 1.98* 1.82* 1.52*  CALCIUM 9.6 9.4 9.2 8.9 8.9  PHOS  --  3.7  --   --   --     Liver Function Tests:  Recent Labs Lab 01/25/14 1513 01/26/14 0025 01/26/14 0458  AST 55* 44* 41*  ALT ALKPHOS 49 35* 39  BILITOT 2.1* 1.3* 1.2  PROT 7.2 5.8* 5.6*  ALBUMIN 2.4* 1.8* 1.9*    CBC:  Recent Labs Lab 01/25/14 1513 01/26/14 0025 01/26/14 0458 02/01/14 0459  WBC 10.3 9.1 8.6 6.4  NEUTROABS 8.7*  --   --   --   HGB 11.4* 9.1* 9.4* 10.4*  HCT 34.4* 28.3* 28.9* 31.3*  MCV 96.4 97.6 98.0 92.6  PLT 292 218 243 254    Imaging PORTABLE CHEST - 1 VIEW  COMPARISON: 01/25/2014  FINDINGS:  Aright arm PICC line has been placed. The catheter tip is in the  region of the lower SVC. There are increased interstitial and  airspace densities throughout both lungs suggestive for pulmonary  edema. Basilar chest densities suggest pleural effusions. Heart size  is upper limits of normal.  IMPRESSION:  PICC line tip in the lower SVC region.  Evidence for pulmonary edema and pleural effusions.   Impression  1. Occasional PVCs noted. Patient has prior documented history of VT, and was on amiodarone and beta blocker at baseline, both currently held given n.p.o. status pending swallowing evaluation. He has had no recurrent VT here. LVEF 45-50% by echocardiogram in April.  2. Known multivessel CAD status post CABG in 2000 as outlined above. Patient has refused  followup invasive workup, had recurrent infarct in 2012. He has been followed conservatively by Dr. Ladona Ridgel.  3. Respiratory failure with probable aspiration pneumonia, being managed by Dr. Juanetta Gosling.  4. Dementia.  5. Acute on chronic renal insufficiency, followed by nephrology. This recent creatinine 1.5.  6. UTI.  7. Probable component of acute on chronic combined heart failure.   Recommendations  I have reviewed the patient's hospital course and recent notes. Plan looks to be for possible hospice care and conservative management. His blood pressure and heart rate at this time preclude return to prior antihypertensive regimen. If he passes his swallow evaluation and is able to take pills, would consider resuming low-dose amiodarone and aspirin. No further cardiac testing planned at this time.  Jonelle Sidle, M.D., F.A.C.C.

## 2014-02-01 NOTE — Progress Notes (Signed)
Subjective: He seems to be improving somewhat. No new complaints.  Objective: Vital signs in last 24 hours: Temp:  [97.5 F (36.4 C)-98.7 F (37.1 C)] 97.5 F (36.4 C) (09/28 0600) Pulse Rate:  [29-87] 65 (09/28 0600) Resp:  [0-23] 13 (09/28 0600) BP: (94-155)/(22-71) 115/46 mmHg (09/28 0600) SpO2:  [92 %-100 %] 97 % (09/28 0600) FiO2 (%):  [32 %] 32 % (09/27 1800) Weight change:  Last BM Date: 01/30/14  Intake/Output from previous day: 09/27 0701 - 09/28 0700 In: 2450 [I.V.:2300; IV Piggyback:150] Out: 2425 [Urine:2425]  PHYSICAL EXAM General appearance: alert, mild distress and Mildly confused Resp: rhonchi bilaterally Cardio: regular rate and rhythm, S1, S2 normal, no murmur, click, rub or gallop GI: soft, non-tender; bowel sounds normal; no masses,  no organomegaly Extremities: He has chronic venous stasis and multiple previous skin lesions on his legs. He has trace edema.  Lab Results:  Results for orders placed during the hospital encounter of 01/25/14 (from the past 48 hour(s))  GLUCOSE, CAPILLARY     Status: Abnormal   Collection Time    01/30/14 11:40 AM      Result Value Ref Range   Glucose-Capillary 207 (*) 70 - 99 mg/dL   Comment 1 Documented in Chart     Comment 2 Notify RN    GLUCOSE, CAPILLARY     Status: Abnormal   Collection Time    01/30/14  5:52 PM      Result Value Ref Range   Glucose-Capillary 174 (*) 70 - 99 mg/dL   Comment 1 Notify RN     Comment 2 Documented in Chart    GLUCOSE, CAPILLARY     Status: Abnormal   Collection Time    01/30/14 11:40 PM      Result Value Ref Range   Glucose-Capillary 179 (*) 70 - 99 mg/dL  VANCOMYCIN, RANDOM     Status: None   Collection Time    01/31/14  5:15 AM      Result Value Ref Range   Vancomycin Rm 21.3     Comment:            Random Vancomycin therapeutic     range is dependent on dosage and     time of specimen collection.     A peak range is 20.0-40.0 ug/mL     A trough range is 5.0-15.0 ug/mL              BASIC METABOLIC PANEL     Status: Abnormal   Collection Time    01/31/14  5:15 AM      Result Value Ref Range   Sodium 137  137 - 147 mEq/L   Potassium 3.4 (*) 3.7 - 5.3 mEq/L   Chloride 105  96 - 112 mEq/L   CO2 22  19 - 32 mEq/L   Glucose, Bld 203 (*) 70 - 99 mg/dL   BUN 52 (*) 6 - 23 mg/dL   Creatinine, Ser 1.82 (*) 0.50 - 1.35 mg/dL   Calcium 8.9  8.4 - 10.5 mg/dL   GFR calc non Af Amer 34 (*) >90 mL/min   GFR calc Af Amer 39 (*) >90 mL/min   Comment: (NOTE)     The eGFR has been calculated using the CKD EPI equation.     This calculation has not been validated in all clinical situations.     eGFR's persistently <90 mL/min signify possible Chronic Kidney     Disease.   Anion gap 10  5 - 15  GLUCOSE, CAPILLARY     Status: Abnormal   Collection Time    01/31/14  6:10 AM      Result Value Ref Range   Glucose-Capillary 211 (*) 70 - 99 mg/dL  GLUCOSE, CAPILLARY     Status: Abnormal   Collection Time    01/31/14 11:37 AM      Result Value Ref Range   Glucose-Capillary 166 (*) 70 - 99 mg/dL   Comment 1 Documented in Chart     Comment 2 Notify RN    GLUCOSE, CAPILLARY     Status: Abnormal   Collection Time    01/31/14  5:36 PM      Result Value Ref Range   Glucose-Capillary 177 (*) 70 - 99 mg/dL   Comment 1 Documented in Chart     Comment 2 Notify RN    GLUCOSE, CAPILLARY     Status: Abnormal   Collection Time    01/31/14 11:35 PM      Result Value Ref Range   Glucose-Capillary 194 (*) 70 - 99 mg/dL  VANCOMYCIN, RANDOM     Status: None   Collection Time    02/01/14  4:59 AM      Result Value Ref Range   Vancomycin Rm 15.9     Comment:            Random Vancomycin therapeutic     range is dependent on dosage and     time of specimen collection.     A peak range is 20.0-40.0 ug/mL     A trough range is 5.0-15.0 ug/mL             BASIC METABOLIC PANEL     Status: Abnormal   Collection Time    02/01/14  4:59 AM      Result Value Ref Range   Sodium 138   137 - 147 mEq/L   Potassium 3.9  3.7 - 5.3 mEq/L   Chloride 105  96 - 112 mEq/L   CO2 22  19 - 32 mEq/L   Glucose, Bld 187 (*) 70 - 99 mg/dL   BUN 44 (*) 6 - 23 mg/dL   Creatinine, Ser 1.52 (*) 0.50 - 1.35 mg/dL   Calcium 8.9  8.4 - 10.5 mg/dL   GFR calc non Af Amer 42 (*) >90 mL/min   GFR calc Af Amer 49 (*) >90 mL/min   Comment: (NOTE)     The eGFR has been calculated using the CKD EPI equation.     This calculation has not been validated in all clinical situations.     eGFR's persistently <90 mL/min signify possible Chronic Kidney     Disease.   Anion gap 11  5 - 15  CBC     Status: Abnormal   Collection Time    02/01/14  4:59 AM      Result Value Ref Range   WBC 6.4  4.0 - 10.5 K/uL   RBC 3.38 (*) 4.22 - 5.81 MIL/uL   Hemoglobin 10.4 (*) 13.0 - 17.0 g/dL   HCT 31.3 (*) 39.0 - 52.0 %   MCV 92.6  78.0 - 100.0 fL   MCH 30.8  26.0 - 34.0 pg   MCHC 33.2  30.0 - 36.0 g/dL   RDW 14.9  11.5 - 15.5 %   Platelets 254  150 - 400 K/uL  PHENYTOIN LEVEL, TOTAL     Status: Abnormal   Collection Time    02/01/14  4:59 AM      Result Value Ref Range   Phenytoin Lvl 9.3 (*) 10.0 - 20.0 ug/mL  GLUCOSE, CAPILLARY     Status: Abnormal   Collection Time    02/01/14  5:51 AM      Result Value Ref Range   Glucose-Capillary 169 (*) 70 - 99 mg/dL    ABGS No results found for this basename: PHART, PCO2, PO2ART, TCO2, HCO3,  in the last 72 hours CULTURES Recent Results (from the past 240 hour(s))  CULTURE, BLOOD (ROUTINE X 2)     Status: None   Collection Time    01/25/14  3:35 PM      Result Value Ref Range Status   Specimen Description BLOOD RIGHT HAND   Final   Special Requests BOTTLES DRAWN AEROBIC AND ANAEROBIC 8CC   Final   Culture  Setup Time     Final   Value: 01/27/2014 00:46     Performed at [facility]   Culture     Final   Value: STAPHYLOCOCCUS SPECIES (COAGULASE NEGATIVE)     Note: THE SIGNIFICANCE OF ISOLATING THIS ORGANISM FROM A SINGLE VENIPUNCTURE CANNOT BE  PREDICTED WITHOUT FURTHER CLINICAL AND CULTURE CORRELATION. SUSCEPTIBILITIES AVAILABLE ONLY ON REQUEST.     Note: Gram Stain Report Called to,Read Back By and Verified With: STONE A. AT 3159 ON 01/26/2014 BY Tyrone Schimke M. Performed at [facility]     Performed at [facility]   Report Status 01/28/2014 FINAL   Final  CULTURE, BLOOD (ROUTINE X 2)     Status: None   Collection Time    01/25/14  3:36 PM      Result Value Ref Range Status   Specimen Description BLOOD LEFT HAND   Final   Special Requests BOTTLES DRAWN AEROBIC ONLY 4CC   Final   Culture NO GROWTH 5 DAYS   Final   Report Status 01/30/2014 FINAL   Final  URINE CULTURE     Status: None   Collection Time    01/25/14  4:15 PM      Result Value Ref Range Status   Specimen Description URINE, CATHETERIZED   Final   Special Requests NONE   Final   Culture  Setup Time     Final   Value: 01/25/2014 23:29     Performed at [place]     Final   Value: >=100,000 COLONIES/ML     Performed at [facility]   Culture     Final   Value: Multiple bacterial morphotypes present, none predominant. Suggest appropriate recollection if clinically indicated.     Performed at [facility]   Report Status 01/26/2014 FINAL   Final  MRSA PCR SCREENING     Status: None   Collection Time    01/25/14  6:12 PM      Result Value Ref Range Status   MRSA by PCR NEGATIVE  NEGATIVE Final   Comment:            The GeneXpert MRSA Assay (FDA     approved for NASAL specimens     only), is one component of a     comprehensive MRSA colonization     surveillance program. It is not     intended to diagnose MRSA     infection nor to guide or     monitor treatment for     MRSA infections.  CULTURE, EXPECTORATED SPUTUM-ASSESSMENT  Status: None   Collection Time    01/25/14  7:50 PM      Result Value Ref Range Status   Specimen Description SPUTUM   Final   Special Requests Normal   Final   Sputum  evaluation     Final   Value: THIS SPECIMEN IS ACCEPTABLE. RESPIRATORY CULTURE REPORT TO FOLLOW.   Report Status 01/25/2014 FINAL   Final  CULTURE, RESPIRATORY (NON-EXPECTORATED)     Status: None   Collection Time    01/25/14  7:50 PM      Result Value Ref Range Status   Specimen Description TRACHEAL ASPIRATE   Final   Special Requests NONE   Final   Gram Stain     Final   Value: MODERATE WBC PRESENT,BOTH PMN AND MONONUCLEAR     RARE SQUAMOUS EPITHELIAL CELLS PRESENT     ABUNDANT GRAM POSITIVE COCCI     IN PAIRS IN CLUSTERS FEW GRAM NEGATIVE RODS     Performed at [facility]   Culture     Final   Value: NORMAL OROPHARYNGEAL FLORA     Performed at [facility]   Report Status 01/28/2014 FINAL   Final  CLOSTRIDIUM DIFFICILE BY PCR     Status: None   Collection Time    01/29/14  9:28 PM      Result Value Ref Range Status   C difficile by pcr NEGATIVE  NEGATIVE Final   Studies/Results: Dg Chest Port 1 View  01/31/2014   CLINICAL DATA:  PICC line placement.  EXAM: PORTABLE CHEST - 1 VIEW  COMPARISON:  01/25/2014  FINDINGS: Aright arm PICC line has been placed. The catheter tip is in the region of the lower SVC. There are increased interstitial and airspace densities throughout both lungs suggestive for pulmonary edema. Basilar chest densities suggest pleural effusions. Heart size is upper limits of normal.  IMPRESSION: PICC line tip in the lower SVC region.  Evidence for pulmonary edema and pleural effusions.   Electronically Signed   By: Markus Daft M.D.   On: 01/31/2014 14:22    Medications:  Prior to Admission:  Prescriptions prior to admission  Medication Sig Dispense Refill  . allopurinol (ZYLOPRIM) 300 MG tablet Take 300 mg by mouth every morning.       Marland Kitchen amiodarone (PACERONE) 200 MG tablet Take 1 tablet (200 mg total) by mouth daily.  30 tablet  12  . amLODipine (NORVASC) 5 MG tablet Take 5 mg by mouth every morning.       Marland Kitchen atorvastatin (LIPITOR) 80 MG tablet  Take 40 mg by mouth at bedtime.       . carvedilol (COREG) 25 MG tablet Take 25 mg by mouth 2 (two) times daily with a meal.      . ciprofloxacin (CILOXAN) 0.3 % ophthalmic solution Place 1 drop into both eyes as needed (Only uses as needed for flares).       . diazepam (VALIUM) 5 MG/ML solution Take 10 mg by mouth every 6 (six) hours as needed for anxiety.      . dipyridamole-aspirin (AGGRENOX) 25-200 MG per 12 hr capsule Take 1 capsule by mouth 2 (two) times daily.      . fenofibrate 160 MG tablet Take 160 mg by mouth every morning.       Marland Kitchen glimepiride (AMARYL) 4 MG tablet Take 4 mg by mouth 2 (two) times daily with a meal.       . hydrALAZINE (APRESOLINE) 50 MG  tablet Take 50 mg by mouth 4 (four) times daily.      Marland Kitchen HYDROcodone-acetaminophen (NORCO/VICODIN) 5-325 MG per tablet Take 1 tablet by mouth every 6 (six) hours as needed for pain.      Marland Kitchen insulin glargine (LANTUS) 100 UNIT/ML injection Inject 25 Units into the skin at bedtime. If sugar is too low none is given      . isosorbide mononitrate (IMDUR) 30 MG 24 hr tablet Take 1 tablet (30 mg total) by mouth daily.  30 tablet  12  . levETIRAcetam (KEPPRA) 500 MG tablet Take 500 mg by mouth 2 (two) times daily.      Marland Kitchen levothyroxine (SYNTHROID, LEVOTHROID) 200 MCG tablet Takes 223mg tablet and 25 mcg tablet for total dose of 2267m      . levothyroxine (SYNTHROID, LEVOTHROID) 25 MCG tablet Takes 20021mtablet and 25 mcg tablet for total dose of 225m83m    . LINZESS 145 MCG CAPS capsule Take 145 mcg by mouth daily as needed (for constipation).       . loMarland Kitchenartan (COZAAR) 25 MG tablet Take 25 mg by mouth daily.      . phenytoin (DILANTIN) 100 MG ER capsule Take 100-300 mg by mouth 2 (two) times daily. Patient take 100 in the morning and 300 at night      . potassium chloride (K-DUR) 10 MEQ tablet Take 20 mEq by mouth 3 (three) times daily.       . promethazine (PHENERGAN) 25 MG tablet Take 12.5-25 tablets by mouth daily as needed for nausea.        . spMarland Kitchenronolactone (ALDACTONE) 25 MG tablet Take 12.5 mg by mouth daily.      . Tamsulosin HCl (FLOMAX) 0.4 MG CAPS Take 0.4 mg by mouth 2 (two) times daily.      . toMarland Kitchensemide (DEMADEX) 20 MG tablet Take 1 tablet (20 mg total) by mouth 2 (two) times daily.  60 tablet  12  . metolazone (ZAROXOLYN) 5 MG tablet Take 5 mg by mouth 2 (two) times daily as needed. Swelling      . nitroGLYCERIN (NITROSTAT) 0.4 MG SL tablet Place 0.4 mg under the tongue every 5 (five) minutes x 3 doses as needed for chest pain.       Scheduled: . antiseptic oral rinse  7 mL Mouth Rinse BID  . heparin  5,000 Units Subcutaneous 3 times per day  . Influenza vac split quadrivalent PF  0.5 mL Intramuscular Tomorrow-1000  . insulin aspart  0-9 Units Subcutaneous Q6H  . phenytoin (DILANTIN) IV  100 mg Intravenous 4 times per day  . piperacillin-tazobactam (ZOSYN)  IV  3.375 g Intravenous Q8H  . pneumococcal 23 valent vaccine  0.5 mL Intramuscular Tomorrow-1000  . sodium chloride  10-40 mL Intracatheter Q12H   Continuous: . dextrose 5 % with kcl 50 mL/hr at 02/01/14 0754   PRN:BSJ:[id number], sodium chloride  Assesment: He was admitted with pneumonia probably from aspiration. He has respiratory failure with hypoxia from that. He had acute on chronic renal failure. He has dementia and has had significant difficulty with behavior at home. Active Problems:   HYPERTENSION   Diabetes mellitus, type 2   Acute on chronic renal failure   Dementia with behavioral disturbance   Chronic combined systolic and diastolic congestive heart failure   Respiratory failure with hypoxia   Sepsis   UTI (lower urinary tract infection)   Community acquired pneumonia   Pneumonia    Plan: I talked to  his daughter today. I think is appropriate for hospice care and she agrees. I don't think is appropriate for inpatient hospice care but I think he is appropriate for outpatient hospice care. I don't think he's quite ready for discharge.  They also have a social issue with a severe leak in his bedroom through the roof since we have had significant amount of rain in the area.    LOS: 7 days   Donald Owen L 02/01/2014, 7:59 AM

## 2014-02-01 NOTE — Progress Notes (Signed)
Speech Language Pathology Treatment: Dysphagia  Patient Details Name: Donald Owen MRN: [id number] DOB: May 15, 1933 Today's Date: 02/01/2014 Time: 4098-1191 SLP Time Calculation (min): 18 min  Assessment / Plan / Recommendation Clinical Impression  Donald Owen was seen at bedside with his daughter present. RN reports that he was shouting out earlier and was given some morphine. Pt awoke to voice and raising HOB. Initially, he was agreeable and opened mouth per SLP request. He made attempts at swallowing and then stated, "NO!". Trials of ice chips and water were presented, however pt refused and shouted "NO!" with all attempts. Pt was observed to swallow spontaneously 3x during SLP visit, but adamantly refused all po trials (soda, ice cream, apple sauce) despite encouragement from SLP and daughter. His daughter reports that he refused foods at home x 2 weeks prior to admission, but was drinking milk shakes.   Given lack of food for several days, would recommend that RN offer ice chips and ice cream (likes vanilla per daughter) after oral care throughout the day. SLP will try again tomorrow.  Plan: Continue NPO, but consider allowing ice chips and ice cream after oral care if pt is willing to accept. Pt remains confused, but alert.   HPI HPI: Pt is a 80 y.o. male admitted 9/21 with fever of 101 and hypotensive/ hypoxiemic; AMS and decreased appetite for the past week. Hx of dementia. CXR 9/21 revealed RLL PNA. Pt has been NPO since admission and 100% non-rebreather. Poor prognosis. SLP bedside swallow eval ordered to assess swallow function and aide in r/o of aspiration PNA.   Pertinent Vitals Pain Assessment: No/denies pain  SLP Plan  Continue with current plan of care    Recommendations Diet recommendations: NPO;Other(comment) (consider trials of ice chips, or ice cream if pt accepts ) Medication Administration: Via alternative means Postural Changes and/or Swallow Maneuvers: Seated upright 90  degrees              Oral Care Recommendations: Oral care prior to ice chips;Oral care Q4 per protocol Follow up Recommendations: 24 hour supervision/assistance Plan: Continue with current plan of care    Thank you,  Havery Moros, CCC-SLP [phone]      Suhana Wilner 02/01/2014, 1:07 PM

## 2014-02-01 NOTE — Progress Notes (Signed)
NUTRITION FOLLOW UP  Intervention:   RD to follow for goals of care  Nutrition Dx:   RD to follow for diet advancement and goals of care  Goal:   Pt will meet nutrition needs as able  Monitor:   Labs, weight changes, I/O's, goals of care  Assessment:   This is a 80 year old man who is unable to give a history because he has become extremely sick in the last few days. He has decreased mental status over the last couple of days. He has been noted to have a fever of 101 today. He has not been eating and drinking for the last week. He has a history of dementia and for the last 3 months he is really not been able to walk independently. When he is well, he is able to verbalize and feed himself.  When he presented to the emergency room, he was moribund, hypotensive, hypoxemic requiring increasing amounts of supplemental oxygen. Prognosis continues to be poor and pt remains NPO.  Speech evaluation revealed that pt is at high aspiration risk and recommendation is for pt to be kept NPO.  Noted that plan is to take pt home with hospice once ready for discharge.   Height: Ht Readings from Last 1 Encounters:  01/25/14  (1.626 m)    Weight Status:   Wt Readings from Last 1 Encounters:  01/30/14 182 lb 5.1 oz (82.7 kg)    Re-estimated needs:  Kcal: 2400-2600  Protein: 107-117 grams  Fluid: 2.4-2.6 L  Skin: stage III pressure ulcer posterior mid sacrum, deep tissue injury on rt heel, deep tissue injury on lt heel, deep tissue injury on rt lateral foot   Diet Order: NPO   Intake/Output Summary (Last 24 hours) at 02/01/14 0927 Last data filed at 02/01/14 0600  Gross per 24 hour  Intake   2400 ml  Output   2425 ml  Net    -25 ml    Last BM: 01/30/14   Labs:   Recent Labs Lab 01/28/14 0441 01/29/14 0431 01/30/14 0520 01/31/14 0515 02/01/14 0459  NA 145 144 143 137 138  K 4.6 3.7 3.1* 3.4* 3.9  CL 112 111 109 105 105  CO2 BUN 67* 66* 59* 52* 44*   CREATININE 2.30* 2.17* 1.98* 1.82* 1.52*  CALCIUM 9.6 9.4 9.2 8.9 8.9  PHOS  --  3.7  --   --   --   GLUCOSE 124* 251* 233* 203* 187*    CBG (last 3)   Recent Labs  01/31/14 1736 01/31/14 2335 02/01/14 0551  GLUCAP 177* 194* 169*    Scheduled Meds: . antiseptic oral rinse  7 mL Mouth Rinse BID  . heparin  5,000 Units Subcutaneous 3 times per day  . Influenza vac split quadrivalent PF  0.5 mL Intramuscular Tomorrow-1000  . insulin aspart  0-9 Units Subcutaneous Q6H  . phenytoin (DILANTIN) IV  100 mg Intravenous 4 times per day  . piperacillin-tazobactam (ZOSYN)  IV  3.375 g Intravenous Q8H  . pneumococcal 23 valent vaccine  0.5 mL Intramuscular Tomorrow-1000  . sodium chloride  10-40 mL Intracatheter Q12H    Continuous Infusions: . dextrose 5 % with kcl 50 mL/hr at 02/01/14 0754    Dennisha Mouser A. Mayford Knife, RD, LDN Pager: [phone]

## 2014-02-01 NOTE — Progress Notes (Signed)
Subjective: Interval History: patient is more alert today and answering questions. No difficulty in breathing  Objective: Vital signs in last 24 hours: Temp:  [97.5 F (36.4 C)-98.7 F (37.1 C)] 97.5 F (36.4 C) (09/28 0600) Pulse Rate:  [29-87] 65 (09/28 0600) Resp:  [0-23] 13 (09/28 0600) BP: (94-155)/(22-71) 115/46 mmHg (09/28 0600) SpO2:  [92 %-100 %] 97 % (09/28 0600) FiO2 (%):  [32 %] 32 % (09/27 1800) Weight change:   Intake/Output from previous day: 09/27 0701 - 09/28 0700 In: 2450 [I.V.:2300; IV Piggyback:150] Out: 2425 [Urine:2425] Intake/Output this shift:    Generally: Patient is more alert today and trying to answer qwuestions  Chest decrease breath sound   but no wheezing,rales or rhonchi Heart RRR no murmur Extremities: trace edema on the right and none ton the left   Lab Results:  Recent Labs  02/01/14 0459  WBC 6.4  HGB 10.4*  HCT 31.3*  PLT 254   BMET:   Recent Labs  01/31/14 0515 02/01/14 0459  NA 137 138  K 3.4* 3.9  CL 105 105  CO2 22 22  GLUCOSE 203* 187*  BUN 52* 44*  CREATININE 1.82* 1.52*  CALCIUM 8.9 8.9   No results found for this basename: PTH,  in the last 72 hours Iron Studies:  No results found for this basename: IRON, TIBC, TRANSFERRIN, FERRITIN,  in the last 72 hours  Studies/Results: Dg Chest Port 1 View  01/31/2014   CLINICAL DATA:  PICC line placement.  EXAM: PORTABLE CHEST - 1 VIEW  COMPARISON:  01/25/2014  FINDINGS: Aright arm PICC line has been placed. The catheter tip is in the region of the lower SVC. There are increased interstitial and airspace densities throughout both lungs suggestive for pulmonary edema. Basilar chest densities suggest pleural effusions. Heart size is upper limits of normal.  IMPRESSION: PICC line tip in the lower SVC region.  Evidence for pulmonary edema and pleural effusions.   Electronically Signed   By: Richarda Overlie M.D.   On: 01/31/2014 14:22    I have reviewed the patient's current  medications.  Assessment/Plan: Problem #1 acute kidney injury: His renal function is improving progressively and returning to his base line Problem #2 chronic renal failure: Possibly stage III. Etiology is multifactorial including diabetes/hypertension/ischemic. Problem #3 diabetes Problem #4 respiratory failure/hypoxemia. Patient has improved and is off Oxygen this morning Problem #5 altered mental status. Patient is more alert and has returned to his base line Problem #6 anemia: His hemoglobin and hematocrit is low but stable Problem #7 Hypokalemia: His potassium is has corrected Problem #8 hypothyroidism Problem#9 Metabolic bone disease his calcium and phosphorus is in range Plan: We'll decrease his ivf to 50 cc/hour We'll check his basic metabolic panel in the morning.   LOS: 7 days   Felipe Paluch S 02/01/2014,7:32 AM

## 2014-02-01 NOTE — Progress Notes (Signed)
ANTIBIOTIC CONSULT NOTE - follow up  Pharmacy Consult for Vancomycin & Zosyn Indication: rule out sepsis / aspiration PNA  No Known Allergies  Patient Measurements: Height:  (162.6 cm) Weight: 182 lb 5.1 oz (82.7 kg) IBW/kg (Calculated) : 59.2  Vital Signs: Temp: 97.6 F (36.4 C) (09/28 0832) BP: 128/40 mmHg (09/28 0700) Pulse Rate: 76 (09/28 0700) Intake/Output from previous day: 09/27 0701 - 09/28 0700 In: 2450 [I.V.:2300; IV Piggyback:150] Out: 2425 [Urine:2425] Intake/Output from this shift:    Labs:  Recent Labs  01/30/14 0520 01/31/14 0515 02/01/14 0459  WBC  --   --  6.4  HGB  --   --  10.4*  PLT  --   --  254  CREATININE 1.98* 1.82* 1.52*   Estimated Creatinine Clearance: 38.9 ml/min (by C-G formula based on Cr of 1.52).  Recent Labs  01/29/14 1406  01/31/14 0515 02/01/14 0459  VANCOTROUGH 24.4*  --   --   --   VANCORANDOM  --   < > 21.3 15.9  < > = values in this interval not displayed.   Microbiology: Recent Results (from the past 720 hour(s))  CULTURE, BLOOD (ROUTINE X 2)     Status: None   Collection Time    01/25/14  3:35 PM      Result Value Ref Range Status   Specimen Description BLOOD RIGHT HAND   Final   Special Requests BOTTLES DRAWN AEROBIC AND ANAEROBIC 8CC   Final   Culture  Setup Time     Final   Value: 01/27/2014 00:46     Performed at [facility]   Culture     Final   Value: STAPHYLOCOCCUS SPECIES (COAGULASE NEGATIVE)     Note: THE SIGNIFICANCE OF ISOLATING THIS ORGANISM FROM A SINGLE VENIPUNCTURE CANNOT BE PREDICTED WITHOUT FURTHER CLINICAL AND CULTURE CORRELATION. SUSCEPTIBILITIES AVAILABLE ONLY ON REQUEST.     Note: Gram Stain Report Called to,Read Back By and Verified With: STONE A. AT 1854 ON 01/26/2014 BY Luetta Nutting M. Performed at [facility]     Performed at [facility]   Report Status 01/28/2014 FINAL   Final  CULTURE, BLOOD (ROUTINE X 2)     Status: None   Collection Time    01/25/14  3:36  PM      Result Value Ref Range Status   Specimen Description BLOOD LEFT HAND   Final   Special Requests BOTTLES DRAWN AEROBIC ONLY 4CC   Final   Culture NO GROWTH 5 DAYS   Final   Report Status 01/30/2014 FINAL   Final  URINE CULTURE     Status: None   Collection Time    01/25/14  4:15 PM      Result Value Ref Range Status   Specimen Description URINE, CATHETERIZED   Final   Special Requests NONE   Final   Culture  Setup Time     Final   Value: 01/25/2014 23:29     Performed at [facility] Count     Final   Value: >=100,000 COLONIES/ML     Performed at [facility]   Culture     Final   Value: Multiple bacterial morphotypes present, none predominant. Suggest appropriate recollection if clinically indicated.     Performed at [facility]   Report Status 01/26/2014 FINAL   Final  MRSA PCR SCREENING     Status: None   Collection Time    01/25/14  6:12 PM      Result Value Ref Range Status   MRSA by PCR NEGATIVE  NEGATIVE Final   Comment:            The GeneXpert MRSA Assay (FDA     approved for NASAL specimens     only), is one component of a     comprehensive MRSA colonization     surveillance program. It is not     intended to diagnose MRSA     infection nor to guide or     monitor treatment for     MRSA infections.  CULTURE, EXPECTORATED SPUTUM-ASSESSMENT     Status: None   Collection Time    01/25/14  7:50 PM      Result Value Ref Range Status   Specimen Description SPUTUM   Final   Special Requests Normal   Final   Sputum evaluation     Final   Value: THIS SPECIMEN IS ACCEPTABLE. RESPIRATORY CULTURE REPORT TO FOLLOW.   Report Status 01/25/2014 FINAL   Final  CULTURE, RESPIRATORY (NON-EXPECTORATED)     Status: None   Collection Time    01/25/14  7:50 PM      Result Value Ref Range Status   Specimen Description TRACHEAL ASPIRATE   Final   Special Requests NONE   Final   Gram Stain     Final   Value: MODERATE WBC PRESENT,BOTH PMN  AND MONONUCLEAR     RARE SQUAMOUS EPITHELIAL CELLS PRESENT     ABUNDANT GRAM POSITIVE COCCI     IN PAIRS IN CLUSTERS FEW GRAM NEGATIVE RODS     Performed at [facility]   Culture     Final   Value: NORMAL OROPHARYNGEAL FLORA     Performed at [facility]   Report Status 01/28/2014 FINAL   Final  CLOSTRIDIUM DIFFICILE BY PCR     Status: None   Collection Time    01/29/14  9:28 PM      Result Value Ref Range Status   C difficile by pcr NEGATIVE  NEGATIVE Final   Medical History: Past Medical History  Diagnosis Date  . Coronary atherosclerosis of native coronary artery 2000    CABG 2000 following acute MI - refused followup cardiac catheterization 2012  . Gout   . Hypothyroidism   . Seizures   . Ventricular tachycardia     On amiodarone  . Diabetes mellitus, type 2   . Cerebrovascular disease 2004    Left cerebral CVA followed by carotid endarterectomy in [place]  . Essential hypertension, benign   . Gastritis 2004    Gastric erosions  . Hyperlipidemia   . Ischemic cardiomyopathy     LVEF 45% 2013   Medications:  Scheduled:  . antiseptic oral rinse  7 mL Mouth Rinse BID  . heparin  5,000 Units Subcutaneous 3 times per day  . Influenza vac split quadrivalent PF  0.5 mL Intramuscular Tomorrow-1000  . insulin aspart  0-9 Units Subcutaneous Q6H  . phenytoin (DILANTIN) IV  100 mg Intravenous 4 times per day  . piperacillin-tazobactam (ZOSYN)  IV  3.375 g Intravenous Q8H  . pneumococcal 23 valent vaccine  0.5 mL Intramuscular Tomorrow-1000  . sodium chloride  10-40 mL Intracatheter Q12H   Assessment: Vancomycin & Zosyn pharmacy protocol for sepsis / aspiration PNA. Culture results as above.  Tracheal aspirate with normal flora.  BCx 1/2 = Coag (-) Staph. Random level now at goal this AM.  Day #8 Vancomycin and Zosyn.  Goal of Therapy:  Vancomycin trough level 15-20 mcg/ml  Plan:  Discussed w/ Dr. Juanetta Gosling, OK to stop Vancomycin. Continue Zosyn 3.375  GM IV every 8 hours, each dose over 4 hours Monitor renal function Labs per protocol   Donald Owen 02/01/2014,9:11 AM

## 2014-02-02 LAB — BASIC METABOLIC PANEL
Anion gap: 10 (ref 5–15)
BUN: 36 mg/dL — AB (ref 6–23)
CHLORIDE: 104 meq/L (ref 96–112)
CO2: 23 mEq/L (ref 19–32)
CREATININE: 1.29 mg/dL (ref 0.50–1.35)
Calcium: 8.9 mg/dL (ref 8.4–10.5)
GFR, EST AFRICAN AMERICAN: 60 mL/min — AB (ref >90)
GFR, EST NON AFRICAN AMERICAN: 51 mL/min — AB (ref >90)
Glucose, Bld: 157 mg/dL — ABNORMAL HIGH (ref 70–99)
Potassium: 3.8 mEq/L (ref 3.7–5.3)
Sodium: 137 mEq/L (ref 137–147)

## 2014-02-02 LAB — GLUCOSE, CAPILLARY
Glucose-Capillary: 147 mg/dL — ABNORMAL HIGH (ref 70–99)
Glucose-Capillary: 183 mg/dL — ABNORMAL HIGH (ref 70–99)

## 2014-02-02 MED ORDER — HALOPERIDOL LACTATE 2 MG/ML PO CONC: 5.0000 mg | Freq: Four times a day (QID) | ORAL | Status: AC | PRN

## 2014-02-02 MED ORDER — MORPHINE SULFATE 20 MG/5ML PO SOLN: 5.0000 mg | ORAL | Status: AC | PRN

## 2014-02-02 MED ORDER — CEPHALEXIN 500 MG PO CAPS: 500.0000 mg | ORAL_CAPSULE | Freq: Three times a day (TID) | ORAL | Status: AC

## 2014-02-02 NOTE — Progress Notes (Signed)
Patient discharged home on hospice care.  Patient with new foley inserted per MD order and double lumen PICC line in place in right arm.  Daughter at bedside and patient vitals stable.  EMS transport patient home. Discharge instructions given and education given on medications and care.  Prescription given to daughter. All belongings taken with family.

## 2014-02-02 NOTE — Progress Notes (Signed)
Donald Owen  MRN: [id number]  DOB/AGE: 05-Feb-1934 80 y.o.  Primary Care Physician:HAWKINS,EDWARD L, MD  Admit date: 01/25/2014  Chief Complaint:  Chief Complaint  Patient presents with  . Altered Mental Status    S-Pt presented on  01/25/2014 with  Chief Complaint  Patient presents with  . Altered Mental Status  .    Pt on communicative, dose not offer any complaints.  Meds . antiseptic oral rinse  7 mL Mouth Rinse BID  . heparin  5,000 Units Subcutaneous 3 times per day  . Influenza vac split quadrivalent PF  0.5 mL Intramuscular Tomorrow-1000  . insulin aspart  0-9 Units Subcutaneous Q6H  . phenytoin (DILANTIN) IV  100 mg Intravenous 4 times per day  . piperacillin-tazobactam (ZOSYN)  IV  3.375 g Intravenous Q8H  . pneumococcal 23 valent vaccine  0.5 mL Intramuscular Tomorrow-1000  . sodium chloride  10-40 mL Intracatheter Q12H         Physical Exam: Vital signs in last 24 hours: Temp:  [97.3 F (36.3 C)-98.6 F (37 C)] 98.5 F (36.9 C) (09/29 0616) Pulse Rate:  [53-83] 79 (09/29 0616) Resp:  [0-22] 16 (09/29 0616) BP: (90-164)/(40-94) 154/57 mmHg (09/29 0616) SpO2:  [83 %-100 %] 95 % (09/29 0616) Weight change:  Last BM Date: 01/31/14  Intake/Output from previous day: 09/28 0701 - 09/29 0700 In: 0  Out: 2250 [Urine:2250]     Physical Exam: General- pt is lethargic,non communicative  Resp- No acute REsp distress, Upper airway sounds + CVS- S1S2 regular in rate and rhythm  GIT- BS+, soft, NT, ND  EXT- NO LE Edema, NO Cyanosis    Lab Results: CBC  Recent Labs  02/01/14 0459  WBC 6.4  HGB 10.4*  HCT 31.3*  PLT 254    BMET  Recent Labs  02/01/14 0459 02/02/14 0517  NA 138 137  K 3.9 3.8  CL 105 104  CO2 22 23  GLUCOSE 187* 157*  BUN 44* 36*  CREATININE 1.52* 1.29  CALCIUM 8.9 8.9   Creat Trend  2015 2.1=>2.77 =>2.49=>1.52=>1.29 1.7--2.3 July admission  1.9--2.4- April admission  2014 1.6--1.8  2013 1.4--2.1  2012 1.6--2.1   2010 2.42    MICRO Recent Results (from the past 240 hour(s))  CULTURE, BLOOD (ROUTINE X 2)     Status: None   Collection Time    01/25/14  3:35 PM      Result Value Ref Range Status   Specimen Description BLOOD RIGHT HAND   Final   Special Requests BOTTLES DRAWN AEROBIC AND ANAEROBIC 8CC   Final   Culture  Setup Time     Final   Value: 01/27/2014 00:46     [facility]   Culture     Final   Value: STAPHYLOCOCCUS SPECIES (COAGULASE NEGATIVE)     Note: THE SIGNIFICANCE OF ISOLATING THIS ORGANISM FROM A SINGLE VENIPUNCTURE CANNOT BE PREDICTED WITHOUT FURTHER CLINICAL AND CULTURE CORRELATION. SUSCEPTIBILITIES AVAILABLE ONLY ON REQUEST.     Note: Gram Stain Report Called to,Read Back By and Verified With: STONE A. AT 1854 ON 01/26/2014 BY Luetta NuttingBAUGHAM M. Performed at [facility]     [facility] at [facility]   Report Status 01/28/2014 FINAL   Final  CULTURE, BLOOD (ROUTINE X 2)     Status: None   Collection Time    01/25/14  3:36 PM      Result Value Ref Range Status   Specimen Description BLOOD LEFT HAND  Final   Special Requests BOTTLES DRAWN AEROBIC ONLY 4CC   Final   Culture NO GROWTH 5 DAYS   Final   Report Status 01/30/2014 FINAL   Final  URINE CULTURE     Status: None   Collection Time    01/25/14  4:15 PM      Result Value Ref Range Status   Specimen Description URINE, CATHETERIZED   Final   Special Requests NONE   Final   Culture  Setup Time     Final   Value: 01/25/2014 23:29     Performed at [facility] Count     Final   Value: >=100,000 COLONIES/ML     Performed at [facility]   Culture     Final   Value: Multiple bacterial morphotypes present, none predominant. Suggest appropriate recollection if clinically indicated.     Performed at [facility]   Report Status 01/26/2014 FINAL   Final  MRSA PCR SCREENING     Status: None   Collection Time    01/25/14  6:12 PM      Result Value Ref Range  Status   MRSA by PCR NEGATIVE  NEGATIVE Final   Comment:            The GeneXpert MRSA Assay (FDA     approved for NASAL specimens     only), is one component of a     comprehensive MRSA colonization     surveillance program. It is not     intended to diagnose MRSA     infection nor to guide or     monitor treatment for     MRSA infections.  CULTURE, EXPECTORATED SPUTUM-ASSESSMENT     Status: None   Collection Time    01/25/14  7:50 PM      Result Value Ref Range Status   Specimen Description SPUTUM   Final   Special Requests Normal   Final   Sputum evaluation     Final   Value: THIS SPECIMEN IS ACCEPTABLE. RESPIRATORY CULTURE REPORT TO FOLLOW.   Report Status 01/25/2014 FINAL   Final  CULTURE, RESPIRATORY (NON-EXPECTORATED)     Status: None   Collection Time    01/25/14  7:50 PM      Result Value Ref Range Status   Specimen Description TRACHEAL ASPIRATE   Final   Special Requests NONE   Final   Gram Stain     Final   Value: MODERATE WBC PRESENT,BOTH PMN AND MONONUCLEAR     RARE SQUAMOUS EPITHELIAL CELLS PRESENT     ABUNDANT GRAM POSITIVE COCCI     IN PAIRS IN CLUSTERS FEW GRAM NEGATIVE RODS     Performed at [facility]   Culture     Final   Value: NORMAL OROPHARYNGEAL FLORA     Performed at [facility]   Report Status 01/28/2014 FINAL   Final  CLOSTRIDIUM DIFFICILE BY PCR     Status: None   Collection Time    01/29/14  9:28 PM      Result Value Ref Range Status   C difficile by pcr NEGATIVE  NEGATIVE Final      Lab Results  Component Value Date   CALCIUM 8.9 02/02/2014   PHOS 3.7 01/29/2014               Impression: 1)Renal AKI secondary to Multiple factors  Hypovolemia/Hypotension/ARB/Sepsis  AKI on CKD  AKI sec to Prerenal/  ATN  Oluguria ATN -Urine output little better AKi better. Creat back to baseline. CKD stage 3.  CKD since 2010  CKD secondary to DM/ HTN/Age associated decline/Multiple AKI   2)CVS- Hemodynamically   better .   3)Anemia HGb at goal (9--11)   4)CKD Mineral-Bone Disorder  Phos at goal Calcium at goal   5)Id- admitted with CAP  On ABX ,Primary MD following   6)Electrolyes  Normokalemic  Normornatremic    7)Acid base  Co2 at goal      Plan:   Will continue current care    Emmylou Bieker S 02/02/2014, 8:56 AM

## 2014-02-02 NOTE — Progress Notes (Signed)
Subjective: He was unable to participate in the speech evaluation yesterday. I discussed hospice with his family yesterday and this morning and they agreed to hospice care want to take him home today with hospice.  Objective: Vital signs in last 24 hours: Temp:  [97.3 F (36.3 C)-98.6 F (37 C)] 98.5 F (36.9 C) (09/29 0616) Pulse Rate:  [53-83] 79 (09/29 0616) Resp:  [0-22] 16 (09/29 0616) BP: (90-164)/(40-94) 154/57 mmHg (09/29 0616) SpO2:  [83 %-100 %] 95 % (09/29 0616) Weight change:  Last BM Date: 01/31/14  Intake/Output from previous day: 09/28 0701 - 09/29 0700 In: 0  Out: 2250 [Urine:2250]  PHYSICAL EXAM General appearance: alert, mild distress and Agitated Resp: rhonchi bilaterally Cardio: regular rate and rhythm, S1, S2 normal, no murmur, click, rub or gallop GI: soft, non-tender; bowel sounds normal; no masses,  no organomegaly Extremities: venous stasis dermatitis noted  Lab Results:  Results for orders placed during the hospital encounter of 01/25/14 (from the past 48 hour(s))  GLUCOSE, CAPILLARY     Status: Abnormal   Collection Time    01/31/14 11:37 AM      Result Value Ref Range   Glucose-Capillary 166 (*) 70 - 99 mg/dL   Comment 1 Documented in Chart     Comment 2 Notify RN    GLUCOSE, CAPILLARY     Status: Abnormal   Collection Time    01/31/14  5:36 PM      Result Value Ref Range   Glucose-Capillary 177 (*) 70 - 99 mg/dL   Comment 1 Documented in Chart     Comment 2 Notify RN    GLUCOSE, CAPILLARY     Status: Abnormal   Collection Time    01/31/14 11:35 PM      Result Value Ref Range   Glucose-Capillary 194 (*) 70 - 99 mg/dL  VANCOMYCIN, RANDOM     Status: None   Collection Time    02/01/14  4:59 AM      Result Value Ref Range   Vancomycin Rm 15.9     Comment:            Random Vancomycin therapeutic     range is dependent on dosage and     time of specimen collection.     A peak range is 20.0-40.0 ug/mL     A trough range is 5.0-15.0  ug/mL             BASIC METABOLIC PANEL     Status: Abnormal   Collection Time    02/01/14  4:59 AM      Result Value Ref Range   Sodium 138  137 - 147 mEq/L   Potassium 3.9  3.7 - 5.3 mEq/L   Chloride 105  96 - 112 mEq/L   CO2 22  19 - 32 mEq/L   Glucose, Bld 187 (*) 70 - 99 mg/dL   BUN 44 (*) 6 - 23 mg/dL   Creatinine, Ser 1.52 (*) 0.50 - 1.35 mg/dL   Calcium 8.9  8.4 - 10.5 mg/dL   GFR calc non Af Amer 42 (*) >90 mL/min   GFR calc Af Amer 49 (*) >90 mL/min   Comment: (NOTE)     The eGFR has been calculated using the CKD EPI equation.     This calculation has not been validated in all clinical situations.     eGFR's persistently <90 mL/min signify possible Chronic Kidney     Disease.   Anion gap 11  5 - 15  CBC     Status: Abnormal   Collection Time    02/01/14  4:59 AM      Result Value Ref Range   WBC 6.4  4.0 - 10.5 K/uL   RBC 3.38 (*) 4.22 - 5.81 MIL/uL   Hemoglobin 10.4 (*) 13.0 - 17.0 g/dL   HCT 31.3 (*) 39.0 - 52.0 %   MCV 92.6  78.0 - 100.0 fL   MCH 30.8  26.0 - 34.0 pg   MCHC 33.2  30.0 - 36.0 g/dL   RDW 14.9  11.5 - 15.5 %   Platelets 254  150 - 400 K/uL  PHENYTOIN LEVEL, TOTAL     Status: Abnormal   Collection Time    02/01/14  4:59 AM      Result Value Ref Range   Phenytoin Lvl 9.3 (*) 10.0 - 20.0 ug/mL  GLUCOSE, CAPILLARY     Status: Abnormal   Collection Time    02/01/14  5:51 AM      Result Value Ref Range   Glucose-Capillary 169 (*) 70 - 99 mg/dL  GLUCOSE, CAPILLARY     Status: Abnormal   Collection Time    02/01/14 11:35 AM      Result Value Ref Range   Glucose-Capillary 154 (*) 70 - 99 mg/dL   Comment 1 Documented in Chart     Comment 2 Notify RN    GLUCOSE, CAPILLARY     Status: Abnormal   Collection Time    02/01/14  5:25 PM      Result Value Ref Range   Glucose-Capillary 167 (*) 70 - 99 mg/dL  BASIC METABOLIC PANEL     Status: Abnormal   Collection Time    02/02/14  5:17 AM      Result Value Ref Range   Sodium 137  137 - 147 mEq/L    Potassium 3.8  3.7 - 5.3 mEq/L   Chloride 104  96 - 112 mEq/L   CO2 23  19 - 32 mEq/L   Glucose, Bld 157 (*) 70 - 99 mg/dL   BUN 36 (*) 6 - 23 mg/dL   Creatinine, Ser 1.29  0.50 - 1.35 mg/dL   Calcium 8.9  8.4 - 10.5 mg/dL   GFR calc non Af Amer 51 (*) >90 mL/min   GFR calc Af Amer 60 (*) >90 mL/min   Comment: (NOTE)     The eGFR has been calculated using the CKD EPI equation.     This calculation has not been validated in all clinical situations.     eGFR's persistently <90 mL/min signify possible Chronic Kidney     Disease.   Anion gap 10  5 - 15  GLUCOSE, CAPILLARY     Status: Abnormal   Collection Time    02/02/14  6:14 AM      Result Value Ref Range   Glucose-Capillary 147 (*) 70 - 99 mg/dL   Comment 1 Notify RN      ABGS No results found for this basename: PHART, PCO2, PO2ART, TCO2, HCO3,  in the last 72 hours CULTURES Recent Results (from the past 240 hour(s))  CULTURE, BLOOD (ROUTINE X 2)     Status: None   Collection Time    01/25/14  3:35 PM      Result Value Ref Range Status   Specimen Description BLOOD RIGHT HAND   Final   Special Requests BOTTLES DRAWN AEROBIC AND ANAEROBIC 8CC   Final   Culture  Setup Time     Final   Value: 01/27/2014 00:46     Performed at [facility]   Culture     Final   Value: STAPHYLOCOCCUS SPECIES (COAGULASE NEGATIVE)     Note: THE SIGNIFICANCE OF ISOLATING THIS ORGANISM FROM A SINGLE VENIPUNCTURE CANNOT BE PREDICTED WITHOUT FURTHER CLINICAL AND CULTURE CORRELATION. SUSCEPTIBILITIES AVAILABLE ONLY ON REQUEST.     Note: Gram Stain Report Called to,Read Back By and Verified With: STONE A. AT 3151 ON 01/26/2014 BY Tyrone Schimke M. Performed at [facility]     Performed at [facility]   Report Status 01/28/2014 FINAL   Final  CULTURE, BLOOD (ROUTINE X 2)     Status: None   Collection Time    01/25/14  3:36 PM      Result Value Ref Range Status   Specimen Description BLOOD LEFT HAND   Final   Special Requests BOTTLES  DRAWN AEROBIC ONLY 4CC   Final   Culture NO GROWTH 5 DAYS   Final   Report Status 01/30/2014 FINAL   Final  URINE CULTURE     Status: None   Collection Time    01/25/14  4:15 PM      Result Value Ref Range Status   Specimen Description URINE, CATHETERIZED   Final   Special Requests NONE   Final   Culture  Setup Time     Final   Value: 01/25/2014 23:29     Performed at [place]     Final   Value: >=100,000 COLONIES/ML     Performed at [facility]   Culture     Final   Value: Multiple bacterial morphotypes present, none predominant. Suggest appropriate recollection if clinically indicated.     Performed at [facility]   Report Status 01/26/2014 FINAL   Final  MRSA PCR SCREENING     Status: None   Collection Time    01/25/14  6:12 PM      Result Value Ref Range Status   MRSA by PCR NEGATIVE  NEGATIVE Final   Comment:            The GeneXpert MRSA Assay (FDA     approved for NASAL specimens     only), is one component of a     comprehensive MRSA colonization     surveillance program. It is not     intended to diagnose MRSA     infection nor to guide or     monitor treatment for     MRSA infections.  CULTURE, EXPECTORATED SPUTUM-ASSESSMENT     Status: None   Collection Time    01/25/14  7:50 PM      Result Value Ref Range Status   Specimen Description SPUTUM   Final   Special Requests Normal   Final   Sputum evaluation     Final   Value: THIS SPECIMEN IS ACCEPTABLE. RESPIRATORY CULTURE REPORT TO FOLLOW.   Report Status 01/25/2014 FINAL   Final  CULTURE, RESPIRATORY (NON-EXPECTORATED)     Status: None   Collection Time    01/25/14  7:50 PM      Result Value Ref Range Status   Specimen Description TRACHEAL ASPIRATE   Final   Special Requests NONE   Final   Gram Stain     Final   Value: MODERATE WBC PRESENT,BOTH PMN AND MONONUCLEAR     RARE SQUAMOUS EPITHELIAL CELLS PRESENT  ABUNDANT GRAM POSITIVE COCCI     IN PAIRS IN  CLUSTERS FEW GRAM NEGATIVE RODS     Performed at [facility]   Culture     Final   Value: NORMAL OROPHARYNGEAL FLORA     Performed at [facility]   Report Status 01/28/2014 FINAL   Final  CLOSTRIDIUM DIFFICILE BY PCR     Status: None   Collection Time    01/29/14  9:28 PM      Result Value Ref Range Status   C difficile by pcr NEGATIVE  NEGATIVE Final   Studies/Results: Dg Chest Port 1 View  01/31/2014   CLINICAL DATA:  PICC line placement.  EXAM: PORTABLE CHEST - 1 VIEW  COMPARISON:  01/25/2014  FINDINGS: Aright arm PICC line has been placed. The catheter tip is in the region of the lower SVC. There are increased interstitial and airspace densities throughout both lungs suggestive for pulmonary edema. Basilar chest densities suggest pleural effusions. Heart size is upper limits of normal.  IMPRESSION: PICC line tip in the lower SVC region.  Evidence for pulmonary edema and pleural effusions.   Electronically Signed   By: Markus Daft M.D.   On: 01/31/2014 14:22    Medications:  Prior to Admission:  Prescriptions prior to admission  Medication Sig Dispense Refill  . allopurinol (ZYLOPRIM) 300 MG tablet Take 300 mg by mouth every morning.       Marland Kitchen amiodarone (PACERONE) 200 MG tablet Take 1 tablet (200 mg total) by mouth daily.  30 tablet  12  . amLODipine (NORVASC) 5 MG tablet Take 5 mg by mouth every morning.       Marland Kitchen atorvastatin (LIPITOR) 80 MG tablet Take 40 mg by mouth at bedtime.       . carvedilol (COREG) 25 MG tablet Take 25 mg by mouth 2 (two) times daily with a meal.      . ciprofloxacin (CILOXAN) 0.3 % ophthalmic solution Place 1 drop into both eyes as needed (Only uses as needed for flares).       . diazepam (VALIUM) 5 MG/ML solution Take 10 mg by mouth every 6 (six) hours as needed for anxiety.      . dipyridamole-aspirin (AGGRENOX) 25-200 MG per 12 hr capsule Take 1 capsule by mouth 2 (two) times daily.      . fenofibrate 160 MG tablet Take 160 mg by mouth  every morning.       Marland Kitchen glimepiride (AMARYL) 4 MG tablet Take 4 mg by mouth 2 (two) times daily with a meal.       . hydrALAZINE (APRESOLINE) 50 MG tablet Take 50 mg by mouth 4 (four) times daily.      Marland Kitchen HYDROcodone-acetaminophen (NORCO/VICODIN) 5-325 MG per tablet Take 1 tablet by mouth every 6 (six) hours as needed for pain.      Marland Kitchen insulin glargine (LANTUS) 100 UNIT/ML injection Inject 25 Units into the skin at bedtime. If sugar is too low none is given      . isosorbide mononitrate (IMDUR) 30 MG 24 hr tablet Take 1 tablet (30 mg total) by mouth daily.  30 tablet  12  . levETIRAcetam (KEPPRA) 500 MG tablet Take 500 mg by mouth 2 (two) times daily.      Marland Kitchen levothyroxine (SYNTHROID, LEVOTHROID) 200 MCG tablet Takes 28mg tablet and 25 mcg tablet for total dose of 2295m      . levothyroxine (SYNTHROID, LEVOTHROID) 25 MCG tablet Takes 20075mtablet and 25  mcg tablet for total dose of 254mg      . LINZESS 145 MCG CAPS capsule Take 145 mcg by mouth daily as needed (for constipation).       .Marland Kitchenlosartan (COZAAR) 25 MG tablet Take 25 mg by mouth daily.      . phenytoin (DILANTIN) 100 MG ER capsule Take 100-300 mg by mouth 2 (two) times daily. Patient take 100 in the morning and 300 at night      . potassium chloride (K-DUR) 10 MEQ tablet Take 20 mEq by mouth 3 (three) times daily.       . promethazine (PHENERGAN) 25 MG tablet Take 12.5-25 tablets by mouth daily as needed for nausea.       .Marland Kitchenspironolactone (ALDACTONE) 25 MG tablet Take 12.5 mg by mouth daily.      . Tamsulosin HCl (FLOMAX) 0.4 MG CAPS Take 0.4 mg by mouth 2 (two) times daily.      .Marland Kitchentorsemide (DEMADEX) 20 MG tablet Take 1 tablet (20 mg total) by mouth 2 (two) times daily.  60 tablet  12  . metolazone (ZAROXOLYN) 5 MG tablet Take 5 mg by mouth 2 (two) times daily as needed. Swelling      . nitroGLYCERIN (NITROSTAT) 0.4 MG SL tablet Place 0.4 mg under the tongue every 5 (five) minutes x 3 doses as needed for chest pain.       Scheduled: .  antiseptic oral rinse  7 mL Mouth Rinse BID  . heparin  5,000 Units Subcutaneous 3 times per day  . Influenza vac split quadrivalent PF  0.5 mL Intramuscular Tomorrow-1000  . insulin aspart  0-9 Units Subcutaneous Q6H  . phenytoin (DILANTIN) IV  100 mg Intravenous 4 times per day  . piperacillin-tazobactam (ZOSYN)  IV  3.375 g Intravenous Q8H  . pneumococcal 23 valent vaccine  0.5 mL Intramuscular Tomorrow-1000  . sodium chloride  10-40 mL Intracatheter Q12H   Continuous: . dextrose 5 % with kcl 50 mL/hr at 02/01/14 0754   POZD:[id number] morphine injection, sodium chloride  Assesment: He was admitted with community-acquired pneumonia probably from aspiration. He has dementia with behavioral disturbance which is probably a combination of Alzheimer's and of multi-infarct dementia. He's had a previous stroke. He is unable to protect his airway totally during swallowing. He is appropriate for hospice care. Active Problems:   HYPERTENSION   Diabetes mellitus, type 2   Acute on chronic renal failure   Dementia with behavioral disturbance   Chronic combined systolic and diastolic congestive heart failure   Respiratory failure with hypoxia   Sepsis   UTI (lower urinary tract infection)   Community acquired pneumonia   Pneumonia   PVC's (premature ventricular contractions)   Cardiomyopathy, ischemic    Plan: Discharge home with hospice    LOS: 8 days   , L 02/02/2014, 8:37 AM

## 2014-02-02 NOTE — Clinical Social Work Note (Addendum)
Pt d/c today home with hospice. [facility]. Out of facility DNR sent with pt. Pt's daughter Babette Relicammy verified address and that she would be present at home upon arrival. Attempted to reach daughter, Lupita LeashDonna on chart but no voicemail set up.   Derenda FennelKara Idolina Mantell, [place] [phone]

## 2014-02-02 NOTE — Progress Notes (Signed)
    Primary cardiologist: Dr. Lewayne BuntingGregg Taylor  Subsequent hospital course and notes reviewed after consultation yesterday. Dr. Juanetta GoslingHawkins note indicates plan for discharge today with home Hospice. We will sign off.   Jonelle SidleSamuel G. Marvalene Barrett, M.D., F.A.C.C.

## 2014-02-02 NOTE — Progress Notes (Signed)
UR chart review completed.  

## 2014-02-04 NOTE — Discharge Summary (Signed)
Physician Discharge Summary  Patient ID: Donald Owen MRN: [id number] DOB/AGE: 1933-12-13 80 y.o. Primary Care Physician:Scotty Pinder L, MD Admit date: 01/25/2014 Discharge date: 02/04/2014    Discharge Diagnoses:   Active Problems:   HYPERTENSION   Diabetes mellitus, type 2   Acute on chronic renal failure   Dementia with behavioral disturbance   Chronic combined systolic and diastolic congestive heart failure   Respiratory failure with hypoxia   Sepsis   UTI (lower urinary tract infection)   Community acquired pneumonia   Pneumonia   PVC's (premature ventricular contractions)   Cardiomyopathy, ischemic  chronic aspiration syndrome    Medication List    STOP taking these medications       AGGRENOX 200-25 MG per 12 hr capsule  Generic drug:  dipyridamole-aspirin     amiodarone 200 MG tablet  Commonly known as:  PACERONE     atorvastatin 80 MG tablet  Commonly known as:  LIPITOR     fenofibrate 160 MG tablet      TAKE these medications       allopurinol 300 MG tablet  Commonly known as:  ZYLOPRIM  Take 300 mg by mouth every morning.     amLODipine 5 MG tablet  Commonly known as:  NORVASC  Take 5 mg by mouth every morning.     carvedilol 25 MG tablet  Commonly known as:  COREG  Take 25 mg by mouth 2 (two) times daily with a meal.     cephALEXin 500 MG capsule  Commonly known as:  KEFLEX  Take 1 capsule (500 mg total) by mouth 3 (three) times daily.     ciprofloxacin 0.3 % ophthalmic solution  Commonly known as:  CILOXAN  Place 1 drop into both eyes as needed (Only uses as needed for flares).     diazepam 5 MG/ML solution  Commonly known as:  VALIUM  Take 10 mg by mouth every 6 (six) hours as needed for anxiety.     glimepiride 4 MG tablet  Commonly known as:  AMARYL  Take 4 mg by mouth 2 (two) times daily with a meal.     haloperidol 2 MG/ML solution  Commonly known as:  HALDOL  Take 2.5 mLs (5 mg total) by mouth every 6 (six) hours as needed  for agitation.     hydrALAZINE 50 MG tablet  Commonly known as:  APRESOLINE  Take 50 mg by mouth 4 (four) times daily.     HYDROcodone-acetaminophen 5-325 MG per tablet  Commonly known as:  NORCO/VICODIN  Take 1 tablet by mouth every 6 (six) hours as needed for pain.     insulin glargine 100 UNIT/ML injection  Commonly known as:  LANTUS  Inject 25 Units into the skin at bedtime. If sugar is too low none is given     isosorbide mononitrate 30 MG 24 hr tablet  Commonly known as:  IMDUR  Take 1 tablet (30 mg total) by mouth daily.     levETIRAcetam 500 MG tablet  Commonly known as:  KEPPRA  Take 500 mg by mouth 2 (two) times daily.     levothyroxine 200 MCG tablet  Commonly known as:  SYNTHROID, LEVOTHROID  Takes 200mcg tablet and 25 mcg tablet for total dose of 225mcg     levothyroxine 25 MCG tablet  Commonly known as:  SYNTHROID, LEVOTHROID  Takes 200mcg tablet and 25 mcg tablet for total dose of 225mcg     LINZESS 145 MCG Caps capsule  Generic drug:  Linaclotide  Take 145 mcg by mouth daily as needed (for constipation).     losartan 25 MG tablet  Commonly known as:  COZAAR  Take 25 mg by mouth daily.     metolazone 5 MG tablet  Commonly known as:  ZAROXOLYN  Take 5 mg by mouth 2 (two) times daily as needed. Swelling     morphine 20 MG/5ML solution  Take 1.3 mLs (5.2 mg total) by mouth every 2 (two) hours as needed for pain (Or agitation).     nitroGLYCERIN 0.4 MG SL tablet  Commonly known as:  NITROSTAT  Place 0.4 mg under the tongue every 5 (five) minutes x 3 doses as needed for chest pain.     phenytoin 100 MG ER capsule  Commonly known as:  DILANTIN  Take 100-300 mg by mouth 2 (two) times daily. Patient take 100 in the morning and 300 at night     potassium chloride 10 MEQ tablet  Commonly known as:  K-DUR  Take 20 mEq by mouth 3 (three) times daily.     promethazine 25 MG tablet  Commonly known as:  PHENERGAN  Take 12.5-25 tablets by mouth daily as  needed for nausea.     spironolactone 25 MG tablet  Commonly known as:  ALDACTONE  Take 12.5 mg by mouth daily.     tamsulosin 0.4 MG Caps capsule  Commonly known as:  FLOMAX  Take 0.4 mg by mouth 2 (two) times daily.     torsemide 20 MG tablet  Commonly known as:  DEMADEX  Take 1 tablet (20 mg total) by mouth 2 (two) times daily.        Discharged Condition: Improved    Consults: Cardiology/nephrology  Significant Diagnostic Studies: US Renal  01/26/2014   CLINICAL DATA:  Renal insufficiency.  EXAM: RENAL/URINARY TRACT ULTRASOUND COMPLETE  COMPARISON:  Nephrolithiasis.  FINDINGS: Right Kidney:  Length: 10.5 cm. Echogenicity within normal limits. No mass or hydronephrosis visualized.  Left Kidney:  Length: 11.4 cm. Echogenicity within normal limits. No mass or hydronephrosis visualized. Left nephrolithiasis cannot be excluded .  Bladder:  Appears normal for degree of bladder distention. Catheters present in the bladder. Bladder wall is thickened. This could be from nondistended state or bladder pathology.  IMPRESSION: 1. Left nephrolithiasis cannot be excluded. No left hydronephrosis or ureterectasis. 2. Foley catheter in the bladder. Bladder is nondistended. Bladder wall thickening is noted. This could be from nondistended state or bladder wall pathology.   Electronically Signed   By: Maisie Fus  Register   On: 01/26/2014 12:23   Dg Chest Port 1 View  01/31/2014   CLINICAL DATA:  PICC line placement.  EXAM: PORTABLE CHEST - 1 VIEW  COMPARISON:  01/25/2014  FINDINGS: Aright arm PICC line has been placed. The catheter tip is in the region of the lower SVC. There are increased interstitial and airspace densities throughout both lungs suggestive for pulmonary edema. Basilar chest densities suggest pleural effusions. Heart size is upper limits of normal.  IMPRESSION: PICC line tip in the lower SVC region.  Evidence for pulmonary edema and pleural effusions.   Electronically Signed   By: Richarda Overlie M.D.   On: 01/31/2014 14:22   Dg Chest Port 1 View  01/25/2014   CLINICAL DATA:  Altered mental status  EXAM: PORTABLE CHEST - 1 VIEW  COMPARISON:  December 06, 2013  FINDINGS: There is patchy infiltrate in the right base. Elsewhere lungs are clear. Heart is enlarged with pulmonary vascularity within  normal limits. No adenopathy. Patient is status post coronary artery bypass grafting.  IMPRESSION: Right base infiltrate.  Stable cardiomegaly.   Electronically Signed   By: Bretta Bang M.D.   On: 01/25/2014 15:46    Lab Results: Basic Metabolic Panel:  Recent Labs  16/10/96 0517  NA 137  K 3.8  CL 104  CO2 23  GLUCOSE 157*  BUN 36*  CREATININE 1.29  CALCIUM 8.9   Liver Function Tests: No results found for this basename: AST, ALT, ALKPHOS, BILITOT, PROT, ALBUMIN,  in the last 72 hours   CBC: No results found for this basename: WBC, NEUTROABS, HGB, HCT, MCV, PLT,  in the last 72 hours  Recent Results (from the past 240 hour(s))  CULTURE, BLOOD (ROUTINE X 2)     Status: None   Collection Time    01/25/14  3:35 PM      Result Value Ref Range Status   Specimen Description BLOOD RIGHT HAND   Final   Special Requests BOTTLES DRAWN AEROBIC AND ANAEROBIC 8CC   Final   Culture  Setup Time     Final   Value: 01/27/2014 00:46     Performed at [facility]   Culture     Final   Value: STAPHYLOCOCCUS SPECIES (COAGULASE NEGATIVE)     Note: THE SIGNIFICANCE OF ISOLATING THIS ORGANISM FROM A SINGLE VENIPUNCTURE CANNOT BE PREDICTED WITHOUT FURTHER CLINICAL AND CULTURE CORRELATION. SUSCEPTIBILITIES AVAILABLE ONLY ON REQUEST.     Note: Gram Stain Report Called to,Read Back By and Verified With: STONE A. AT 1854 ON 01/26/2014 BY Luetta Nutting M. Performed at [facility]     Performed at [facility]   Report Status 01/28/2014 FINAL   Final  CULTURE, BLOOD (ROUTINE X 2)     Status: None   Collection Time    01/25/14  3:36 PM      Result Value Ref Range Status    Specimen Description BLOOD LEFT HAND   Final   Special Requests BOTTLES DRAWN AEROBIC ONLY 4CC   Final   Culture NO GROWTH 5 DAYS   Final   Report Status 01/30/2014 FINAL   Final  URINE CULTURE     Status: None   Collection Time    01/25/14  4:15 PM      Result Value Ref Range Status   Specimen Description URINE, CATHETERIZED   Final   Special Requests NONE   Final   Culture  Setup Time     Final   Value: 01/25/2014 23:29     Performed at [facility] Count     Final   Value: >=100,000 COLONIES/ML     Performed at [facility]   Culture     Final   Value: Multiple bacterial morphotypes present, none predominant. Suggest appropriate recollection if clinically indicated.     Performed at [facility]   Report Status 01/26/2014 FINAL   Final  MRSA PCR SCREENING     Status: None   Collection Time    01/25/14  6:12 PM      Result Value Ref Range Status   MRSA by PCR NEGATIVE  NEGATIVE Final   Comment:            The GeneXpert MRSA Assay (FDA     approved for NASAL specimens     only), is one component of a     comprehensive MRSA colonization     surveillance program. It  is not     intended to diagnose MRSA     infection nor to guide or     monitor treatment for     MRSA infections.  CULTURE, EXPECTORATED SPUTUM-ASSESSMENT     Status: None   Collection Time    01/25/14  7:50 PM      Result Value Ref Range Status   Specimen Description SPUTUM   Final   Special Requests Normal   Final   Sputum evaluation     Final   Value: THIS SPECIMEN IS ACCEPTABLE. RESPIRATORY CULTURE REPORT TO FOLLOW.   Report Status 01/25/2014 FINAL   Final  CULTURE, RESPIRATORY (NON-EXPECTORATED)     Status: None   Collection Time    01/25/14  7:50 PM      Result Value Ref Range Status   Specimen Description TRACHEAL ASPIRATE   Final   Special Requests NONE   Final   Gram Stain     Final   Value: MODERATE WBC PRESENT,BOTH PMN AND MONONUCLEAR     RARE SQUAMOUS  EPITHELIAL CELLS PRESENT     ABUNDANT GRAM POSITIVE COCCI     IN PAIRS IN CLUSTERS FEW GRAM NEGATIVE RODS     Performed at [facility]   Culture     Final   Value: NORMAL OROPHARYNGEAL FLORA     Performed at [facility]   Report Status 01/28/2014 FINAL   Final  CLOSTRIDIUM DIFFICILE BY PCR     Status: None   Collection Time    01/29/14  9:28 PM      Result Value Ref Range Status   C difficile by pcr NEGATIVE  NEGATIVE Final     Hospital Course: He was admitted with confusion agitation poor appetite dehydration acute on chronic renal failure. He was clear that he was having chronic aspiration. He was treated and improved somewhat but after discussion with the family it was felt that he would be best served by hospice care. This was arranged and he was transferred to hospice care at home  Discharge Exam: Blood pressure 154/57, pulse 79, temperature 98.5 F (36.9 C), temperature source Axillary, resp. rate 16, height 5\' 4"  (1.626 m), weight 82.7 kg (182 lb 5.1 oz), SpO2 95.00%. He is awake and alert and somewhat agitated. His chest is clear. Heart is irregular. His abdomen is soft  Disposition: Home with hospice care      Discharge Instructions   Discharge patient    Complete by:  As directed   Please change Foley catheter before discharge             Signed: Jahfari Ambers L   02/04/2014, 9:07 AM

## 2014-03-07 DEATH — deceased

## 2014-05-04 ENCOUNTER — Encounter: Payer: Self-pay | Admitting: *Deleted

## 2016-02-18 IMAGING — US US RENAL
1 series · 14 of 25 positions shown · non-contrast
Comparison: Nephrolithiasis.

CLINICAL DATA: Renal insufficiency.

EXAM:
RENAL/URINARY TRACT ULTRASOUND COMPLETE

[Series 1: us renal · 0.21mm/px · 14 of 62 slices shown]
[im 1/62]
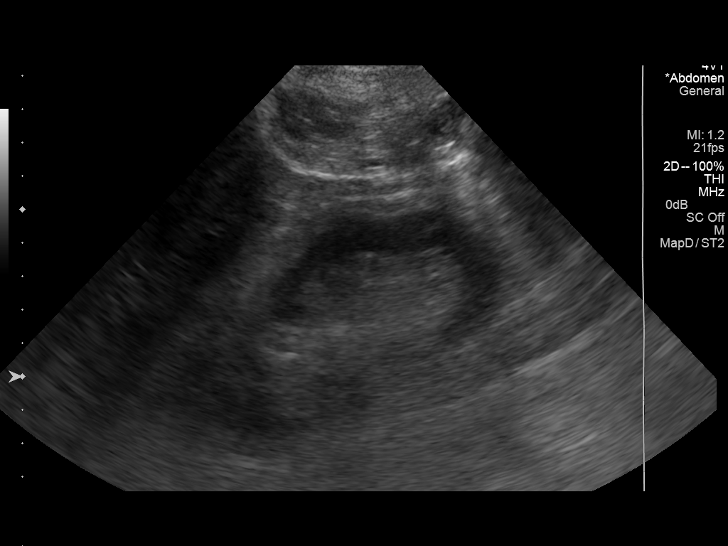
[im 6/62]
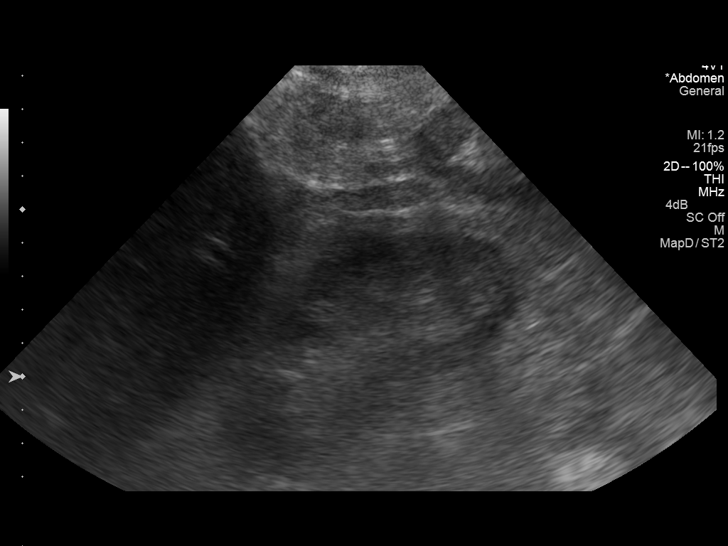
[im 11/62]
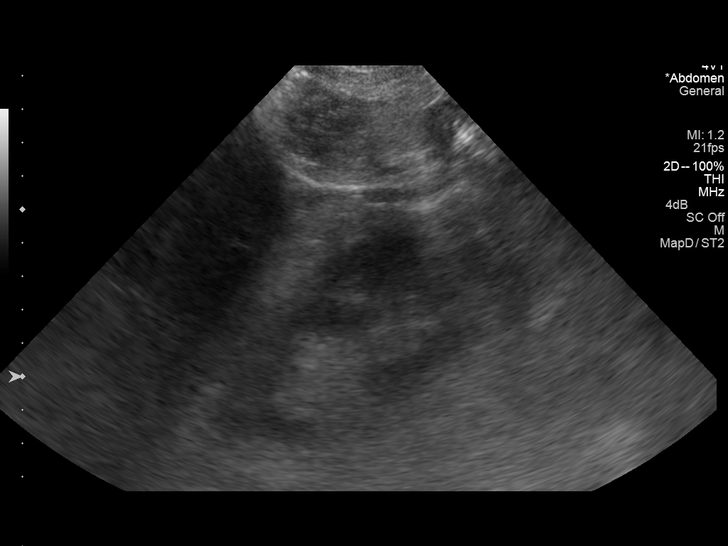
[im 16/62]
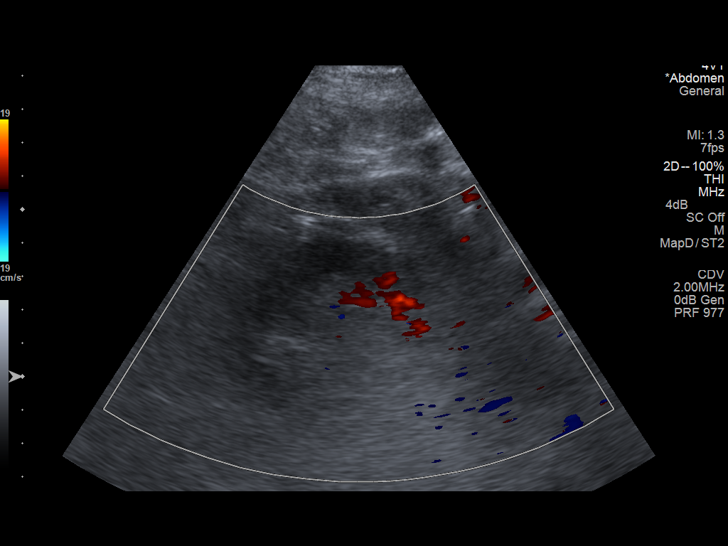
[im 21/62]
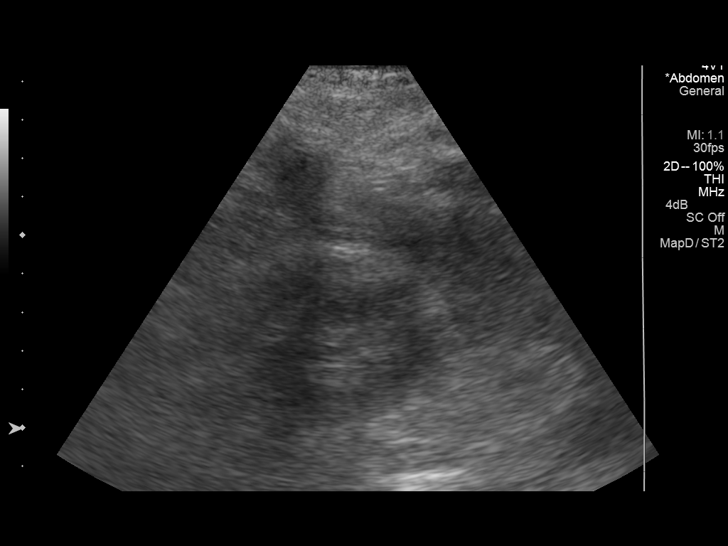
[im 23/62]
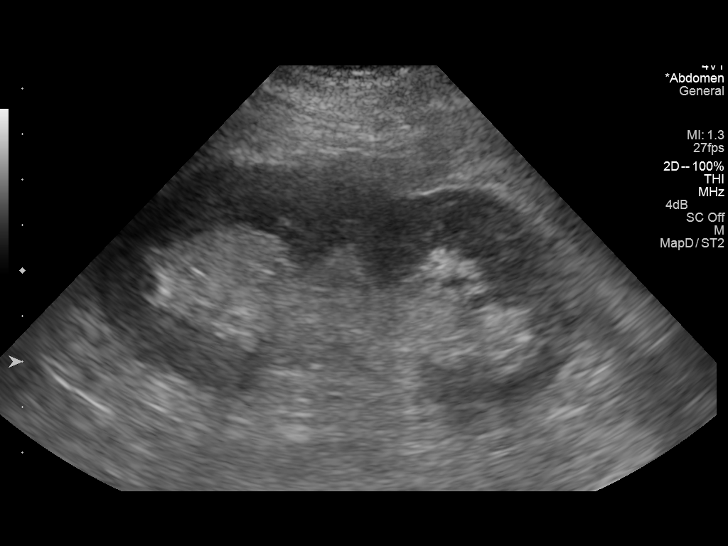
[im 28/62]
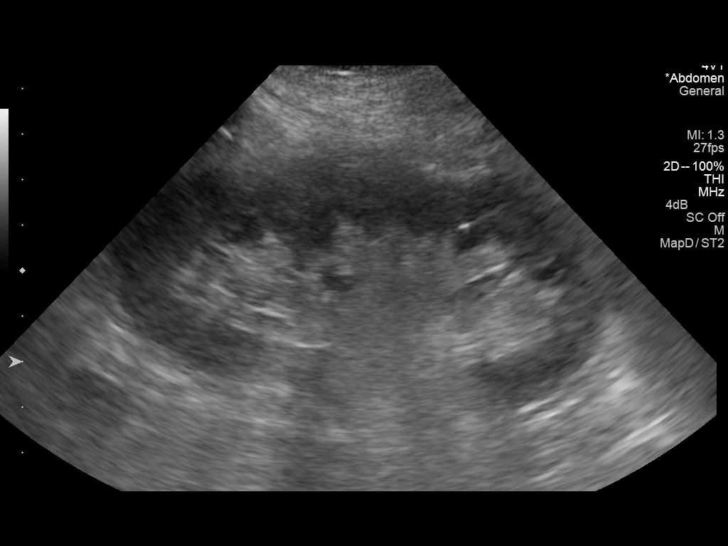
[im 34/62]
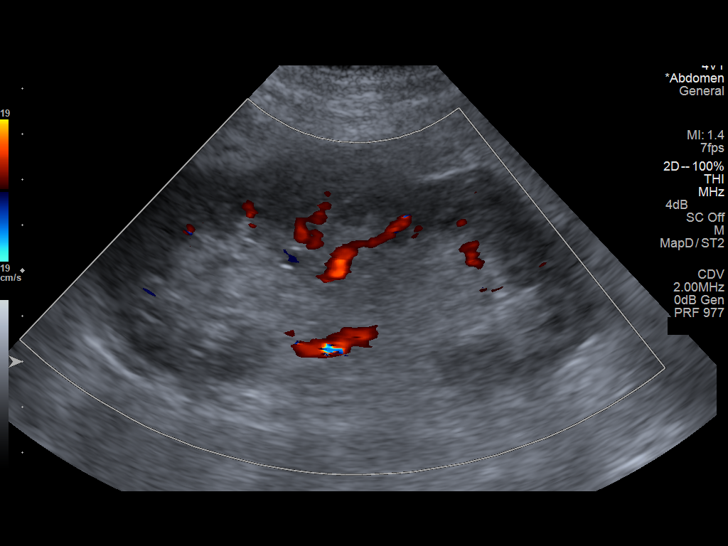
[im 39/62]
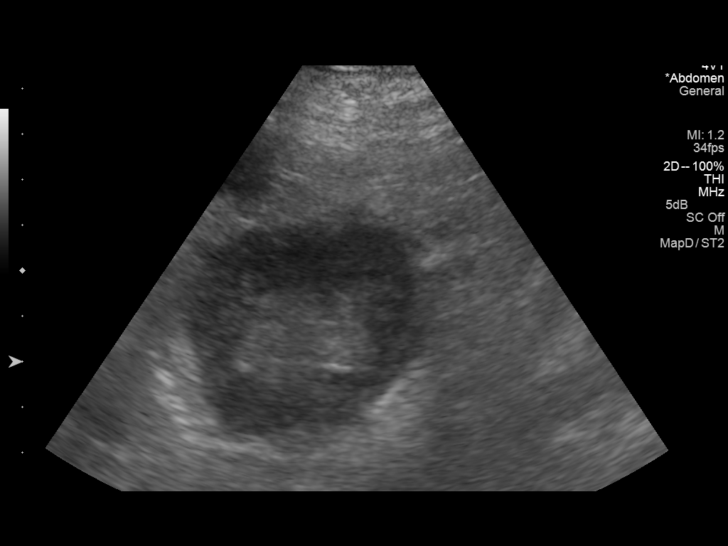
[im 41/62]
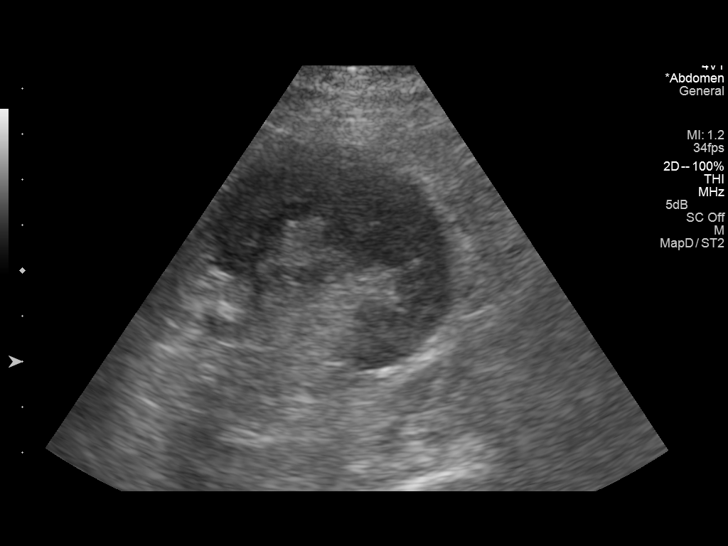
[im 46/62]
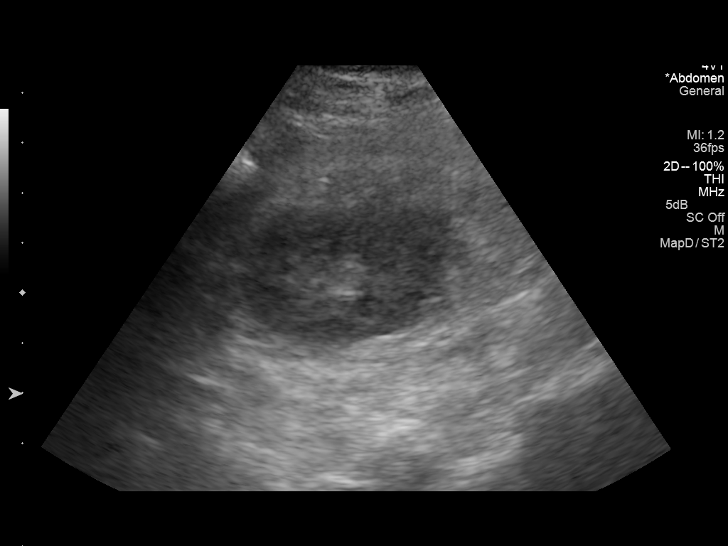
[im 51/62]
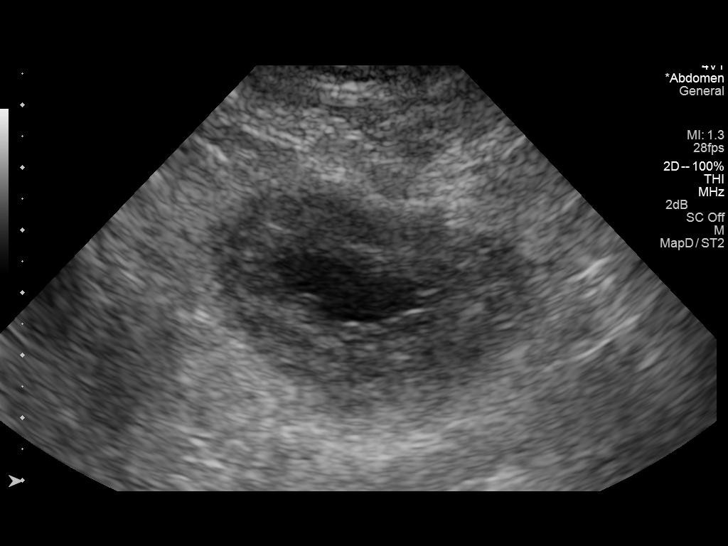
[im 56/62]
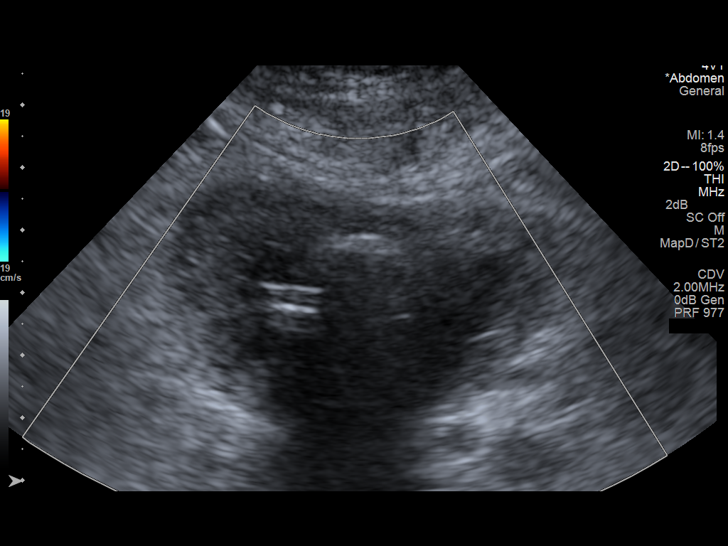
[im 62/62]
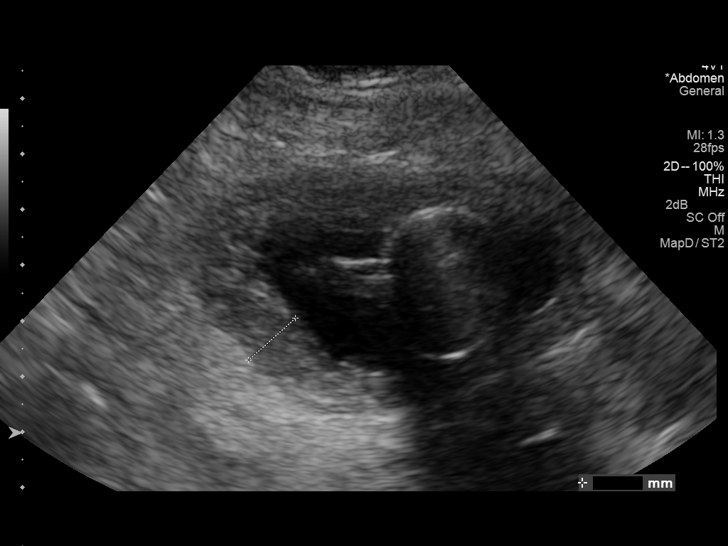

[14 of 25 positions shown; findings below may reference images not displayed]

FINDINGS: Right Kidney:

Length: 10.5 cm. Echogenicity within normal limits. No mass or
hydronephrosis visualized.

Left Kidney:

Length: 11.4 cm. Echogenicity within normal limits. No mass or
hydronephrosis visualized. Left nephrolithiasis cannot be excluded .

Bladder:

Appears normal for degree of bladder distention. Catheters present
in the bladder. Bladder wall is thickened. This could be from
nondistended state or bladder pathology.
IMPRESSION: 1. Left nephrolithiasis cannot be excluded. No left hydronephrosis
or ureterectasis.
2. Foley catheter in the bladder. Bladder is nondistended. Bladder
wall thickening is noted. This could be from nondistended state or
bladder wall pathology.
